# Patient Record
Sex: Female | Born: 1985 | Race: White | Hispanic: No | Marital: Married | Smoking: Never smoker
Health system: Southern US, Community
[De-identification: ages and names within clinical notes are randomized; demographics above are authoritative.]

## PROBLEM LIST (undated history)

## (undated) DIAGNOSIS — G901 Familial dysautonomia [Riley-Day]: Secondary | ICD-10-CM

## (undated) DIAGNOSIS — F419 Anxiety disorder, unspecified: Secondary | ICD-10-CM

## (undated) DIAGNOSIS — N938 Other specified abnormal uterine and vaginal bleeding: Secondary | ICD-10-CM

## (undated) DIAGNOSIS — F410 Panic disorder [episodic paroxysmal anxiety] without agoraphobia: Secondary | ICD-10-CM

## (undated) DIAGNOSIS — I951 Orthostatic hypotension: Secondary | ICD-10-CM

## (undated) DIAGNOSIS — R002 Palpitations: Secondary | ICD-10-CM

## (undated) DIAGNOSIS — I498 Other specified cardiac arrhythmias: Secondary | ICD-10-CM

## (undated) DIAGNOSIS — R000 Tachycardia, unspecified: Secondary | ICD-10-CM

## (undated) DIAGNOSIS — G90A Postural orthostatic tachycardia syndrome (POTS): Secondary | ICD-10-CM

## (undated) DIAGNOSIS — Z9289 Personal history of other medical treatment: Secondary | ICD-10-CM

## (undated) DIAGNOSIS — F250 Schizoaffective disorder, bipolar type: Secondary | ICD-10-CM

## (undated) DIAGNOSIS — F329 Major depressive disorder, single episode, unspecified: Secondary | ICD-10-CM

## (undated) DIAGNOSIS — F32A Depression, unspecified: Secondary | ICD-10-CM

## (undated) DIAGNOSIS — I471 Supraventricular tachycardia: Secondary | ICD-10-CM

## (undated) HISTORY — PX: TUBAL LIGATION: SHX77

## (undated) HISTORY — DX: Depression, unspecified: F32.A

## (undated) HISTORY — DX: Personal history of other medical treatment: Z92.89

## (undated) HISTORY — DX: Supraventricular tachycardia: I47.1

## (undated) HISTORY — PX: VENTRICULAR ABLATION SURGERY: SHX835

## (undated) HISTORY — DX: Other specified abnormal uterine and vaginal bleeding: N93.8

## (undated) HISTORY — DX: Palpitations: R00.2

## (undated) HISTORY — DX: Schizoaffective disorder, bipolar type: F25.0

## (undated) HISTORY — DX: Major depressive disorder, single episode, unspecified: F32.9

## (undated) HISTORY — DX: Familial dysautonomia (riley-day): G90.1

---
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
## 2003-02-10 ENCOUNTER — Other Ambulatory Visit: Admission: RE | Admit: 2003-02-10 | Discharge: 2003-02-10 | Payer: Self-pay | Admitting: *Deleted

## 2003-05-19 ENCOUNTER — Emergency Department (HOSPITAL_COMMUNITY): Admission: EM | Admit: 2003-05-19 | Discharge: 2003-05-19 | Payer: Self-pay | Admitting: Internal Medicine

## 2003-05-20 ENCOUNTER — Other Ambulatory Visit: Admission: RE | Admit: 2003-05-20 | Discharge: 2003-05-20 | Payer: Self-pay | Admitting: *Deleted

## 2003-08-16 ENCOUNTER — Other Ambulatory Visit: Admission: RE | Admit: 2003-08-16 | Discharge: 2003-08-16 | Payer: Self-pay | Admitting: *Deleted

## 2003-11-17 ENCOUNTER — Emergency Department (HOSPITAL_COMMUNITY): Admission: EM | Admit: 2003-11-17 | Discharge: 2003-11-17 | Payer: Self-pay | Admitting: Emergency Medicine

## 2003-12-22 ENCOUNTER — Emergency Department (HOSPITAL_COMMUNITY): Admission: EM | Admit: 2003-12-22 | Discharge: 2003-12-22 | Payer: Self-pay | Admitting: Emergency Medicine

## 2003-12-25 ENCOUNTER — Emergency Department (HOSPITAL_COMMUNITY): Admission: EM | Admit: 2003-12-25 | Discharge: 2003-12-25 | Payer: Self-pay | Admitting: Emergency Medicine

## 2004-08-07 ENCOUNTER — Ambulatory Visit: Payer: Self-pay | Admitting: Orthopedic Surgery

## 2004-08-17 ENCOUNTER — Encounter (HOSPITAL_COMMUNITY): Admission: RE | Admit: 2004-08-17 | Discharge: 2004-09-16 | Payer: Self-pay | Admitting: Orthopedic Surgery

## 2005-07-22 ENCOUNTER — Emergency Department (HOSPITAL_COMMUNITY): Admission: EM | Admit: 2005-07-22 | Discharge: 2005-07-22 | Payer: Self-pay | Admitting: Emergency Medicine

## 2005-09-11 ENCOUNTER — Ambulatory Visit (HOSPITAL_COMMUNITY): Admission: RE | Admit: 2005-09-11 | Discharge: 2005-09-11 | Payer: Self-pay | Admitting: Family Medicine

## 2005-09-13 ENCOUNTER — Ambulatory Visit: Payer: Self-pay | Admitting: *Deleted

## 2005-09-24 ENCOUNTER — Ambulatory Visit (HOSPITAL_COMMUNITY): Admission: RE | Admit: 2005-09-24 | Discharge: 2005-09-24 | Payer: Self-pay | Admitting: *Deleted

## 2005-11-18 ENCOUNTER — Observation Stay (HOSPITAL_COMMUNITY): Admission: AD | Admit: 2005-11-18 | Discharge: 2005-11-18 | Payer: Self-pay | Admitting: Obstetrics and Gynecology

## 2006-01-03 ENCOUNTER — Observation Stay (HOSPITAL_COMMUNITY): Admission: RE | Admit: 2006-01-03 | Discharge: 2006-01-03 | Payer: Self-pay | Admitting: Obstetrics and Gynecology

## 2006-01-06 ENCOUNTER — Inpatient Hospital Stay (HOSPITAL_COMMUNITY): Admission: AD | Admit: 2006-01-06 | Discharge: 2006-01-08 | Payer: Self-pay | Admitting: Obstetrics and Gynecology

## 2006-01-25 ENCOUNTER — Observation Stay (HOSPITAL_COMMUNITY): Admission: AD | Admit: 2006-01-25 | Discharge: 2006-01-26 | Payer: Self-pay | Admitting: Obstetrics & Gynecology

## 2006-02-06 ENCOUNTER — Inpatient Hospital Stay (HOSPITAL_COMMUNITY): Admission: RE | Admit: 2006-02-06 | Discharge: 2006-02-08 | Payer: Self-pay | Admitting: Obstetrics & Gynecology

## 2006-10-07 ENCOUNTER — Emergency Department (HOSPITAL_COMMUNITY): Admission: EM | Admit: 2006-10-07 | Discharge: 2006-10-08 | Payer: Self-pay | Admitting: Emergency Medicine

## 2006-12-05 ENCOUNTER — Ambulatory Visit: Payer: Self-pay | Admitting: Internal Medicine

## 2007-02-14 ENCOUNTER — Ambulatory Visit: Payer: Self-pay | Admitting: Internal Medicine

## 2007-03-27 ENCOUNTER — Ambulatory Visit: Payer: Self-pay | Admitting: Cardiovascular Disease

## 2007-03-27 ENCOUNTER — Emergency Department (HOSPITAL_COMMUNITY): Admission: EM | Admit: 2007-03-27 | Discharge: 2007-03-27 | Payer: Self-pay | Admitting: Emergency Medicine

## 2007-04-23 ENCOUNTER — Ambulatory Visit: Payer: Self-pay | Admitting: Internal Medicine

## 2007-05-11 ENCOUNTER — Emergency Department (HOSPITAL_COMMUNITY): Admission: EM | Admit: 2007-05-11 | Discharge: 2007-05-11 | Payer: Self-pay | Admitting: Emergency Medicine

## 2007-06-02 ENCOUNTER — Ambulatory Visit: Payer: Self-pay | Admitting: Internal Medicine

## 2007-09-01 ENCOUNTER — Ambulatory Visit: Payer: Self-pay | Admitting: Internal Medicine

## 2007-11-20 ENCOUNTER — Emergency Department (HOSPITAL_COMMUNITY): Admission: EM | Admit: 2007-11-20 | Discharge: 2007-11-20 | Payer: Self-pay | Admitting: Emergency Medicine

## 2007-12-04 ENCOUNTER — Emergency Department (HOSPITAL_COMMUNITY): Admission: EM | Admit: 2007-12-04 | Discharge: 2007-12-04 | Payer: Self-pay | Admitting: Emergency Medicine

## 2007-12-08 ENCOUNTER — Ambulatory Visit: Payer: Self-pay | Admitting: Internal Medicine

## 2008-01-05 ENCOUNTER — Ambulatory Visit: Payer: Self-pay | Admitting: Cardiology

## 2008-02-06 ENCOUNTER — Ambulatory Visit: Payer: Self-pay | Admitting: Internal Medicine

## 2008-04-26 ENCOUNTER — Ambulatory Visit: Payer: Self-pay | Admitting: Internal Medicine

## 2008-04-26 LAB — CONVERTED CEMR LAB
CO2: 28 meq/L (ref 19–32)
Calcium: 9.3 mg/dL (ref 8.4–10.5)
Chloride: 106 meq/L (ref 96–112)
Creatinine, Ser: 0.7 mg/dL (ref 0.4–1.2)
Glucose, Bld: 86 mg/dL (ref 70–99)
Potassium: 3.9 meq/L (ref 3.5–5.1)

## 2008-08-26 ENCOUNTER — Ambulatory Visit (HOSPITAL_COMMUNITY): Admission: RE | Admit: 2008-08-26 | Discharge: 2008-08-26 | Payer: Self-pay | Admitting: Pediatrics

## 2008-09-13 ENCOUNTER — Encounter: Payer: Self-pay | Admitting: Internal Medicine

## 2008-09-13 ENCOUNTER — Ambulatory Visit: Payer: Self-pay | Admitting: Internal Medicine

## 2008-10-12 ENCOUNTER — Other Ambulatory Visit: Admission: RE | Admit: 2008-10-12 | Discharge: 2008-10-12 | Payer: Self-pay | Admitting: Obstetrics and Gynecology

## 2008-10-25 ENCOUNTER — Emergency Department (HOSPITAL_COMMUNITY): Admission: EM | Admit: 2008-10-25 | Discharge: 2008-10-25 | Payer: Self-pay | Admitting: Emergency Medicine

## 2009-02-23 ENCOUNTER — Ambulatory Visit: Payer: Self-pay | Admitting: Cardiology

## 2009-02-23 ENCOUNTER — Encounter: Payer: Self-pay | Admitting: Physician Assistant

## 2009-02-23 DIAGNOSIS — R000 Tachycardia, unspecified: Secondary | ICD-10-CM

## 2009-02-23 DIAGNOSIS — I959 Hypotension, unspecified: Secondary | ICD-10-CM

## 2009-03-05 ENCOUNTER — Emergency Department (HOSPITAL_COMMUNITY): Admission: EM | Admit: 2009-03-05 | Discharge: 2009-03-05 | Payer: Self-pay | Admitting: Emergency Medicine

## 2009-03-24 ENCOUNTER — Ambulatory Visit: Payer: Self-pay | Admitting: Obstetrics & Gynecology

## 2009-03-24 ENCOUNTER — Observation Stay (HOSPITAL_COMMUNITY): Admission: AD | Admit: 2009-03-24 | Discharge: 2009-03-24 | Payer: Self-pay | Admitting: Obstetrics & Gynecology

## 2009-05-04 ENCOUNTER — Ambulatory Visit: Payer: Self-pay | Admitting: Obstetrics & Gynecology

## 2009-05-04 ENCOUNTER — Inpatient Hospital Stay (HOSPITAL_COMMUNITY): Admission: AD | Admit: 2009-05-04 | Discharge: 2009-05-05 | Payer: Self-pay | Admitting: Obstetrics & Gynecology

## 2009-05-06 ENCOUNTER — Inpatient Hospital Stay (HOSPITAL_COMMUNITY): Admission: AD | Admit: 2009-05-06 | Discharge: 2009-05-06 | Payer: Self-pay | Admitting: Obstetrics & Gynecology

## 2009-05-06 ENCOUNTER — Ambulatory Visit: Payer: Self-pay | Admitting: Advanced Practice Midwife

## 2009-05-25 ENCOUNTER — Encounter: Payer: Self-pay | Admitting: Internal Medicine

## 2009-07-22 ENCOUNTER — Ambulatory Visit: Payer: Self-pay | Admitting: Internal Medicine

## 2009-08-02 ENCOUNTER — Encounter: Payer: Self-pay | Admitting: Internal Medicine

## 2009-08-02 ENCOUNTER — Telehealth: Payer: Self-pay | Admitting: Internal Medicine

## 2009-08-04 ENCOUNTER — Encounter (HOSPITAL_COMMUNITY): Admission: RE | Admit: 2009-08-04 | Discharge: 2009-09-03 | Payer: Self-pay | Admitting: Family Medicine

## 2009-08-09 ENCOUNTER — Ambulatory Visit (HOSPITAL_COMMUNITY): Admission: RE | Admit: 2009-08-09 | Discharge: 2009-08-09 | Payer: Self-pay | Admitting: Family Medicine

## 2009-08-30 ENCOUNTER — Emergency Department (HOSPITAL_COMMUNITY): Admission: EM | Admit: 2009-08-30 | Discharge: 2009-08-31 | Payer: Self-pay | Admitting: Emergency Medicine

## 2009-09-05 ENCOUNTER — Encounter (HOSPITAL_COMMUNITY): Admission: RE | Admit: 2009-09-05 | Discharge: 2009-10-05 | Payer: Self-pay | Admitting: Family Medicine

## 2009-10-06 ENCOUNTER — Ambulatory Visit: Payer: Self-pay | Admitting: Internal Medicine

## 2010-02-20 ENCOUNTER — Telehealth: Payer: Self-pay | Admitting: Internal Medicine

## 2010-03-03 ENCOUNTER — Ambulatory Visit: Payer: Self-pay | Admitting: Internal Medicine

## 2010-03-03 DIAGNOSIS — G9009 Other idiopathic peripheral autonomic neuropathy: Secondary | ICD-10-CM | POA: Insufficient documentation

## 2010-03-03 LAB — CONVERTED CEMR LAB
CO2: 28 meq/L (ref 19–32)
Calcium: 9.4 mg/dL (ref 8.4–10.5)
Creatinine, Ser: 0.7 mg/dL (ref 0.4–1.2)
Potassium: 4.1 meq/L (ref 3.5–5.1)
Sodium: 138 meq/L (ref 135–145)

## 2010-03-04 ENCOUNTER — Emergency Department (HOSPITAL_COMMUNITY): Admission: EM | Admit: 2010-03-04 | Discharge: 2010-03-04 | Payer: Self-pay | Admitting: Emergency Medicine

## 2010-03-07 ENCOUNTER — Telehealth: Payer: Self-pay | Admitting: Internal Medicine

## 2010-03-07 LAB — CONVERTED CEMR LAB
Hemoglobin, Urine: NEGATIVE
Ketones, ur: NEGATIVE mg/dL
Leukocytes, UA: NEGATIVE
Nitrite: NEGATIVE
Specific Gravity, Urine: 1.019 (ref 1.005–1.030)
Urine Glucose: NEGATIVE mg/dL

## 2010-03-10 ENCOUNTER — Ambulatory Visit: Payer: Self-pay | Admitting: Internal Medicine

## 2010-03-13 LAB — CONVERTED CEMR LAB
BUN: 13 mg/dL (ref 6–23)
Chloride: 106 meq/L (ref 96–112)
Creatinine, Ser: 0.8 mg/dL (ref 0.4–1.2)
Potassium: 4.2 meq/L (ref 3.5–5.1)
Sodium: 137 meq/L (ref 135–145)

## 2010-03-24 ENCOUNTER — Ambulatory Visit: Payer: Self-pay | Admitting: Internal Medicine

## 2010-03-27 ENCOUNTER — Telehealth (INDEPENDENT_AMBULATORY_CARE_PROVIDER_SITE_OTHER): Payer: Self-pay | Admitting: *Deleted

## 2010-07-13 NOTE — Progress Notes (Signed)
Summary: Exercise Program  Phone Note Outgoing Call   Summary of Call: Exercise program at [facility]  Follow-up for Phone Call        [facility] for patient that the exercise program at [facility] is $68.00 per month times 3 months. The rep that called stated that Medicaid does not cover this service. I asked her to call me if she was interested in signing up.  Layne Benton, RN, BSN  March 27, 2010 6:26 PM

## 2010-07-13 NOTE — Assessment & Plan Note (Signed)
Summary: 3 month rov/sl  Medications Added LEXAPRO 10 MG TABS (ESCITALOPRAM OXALATE) pt doesnt take everyday she forgets FLUDROCORTISONE ACETATE 0.1 MG TABS (FLUDROCORTISONE ACETATE) one half a tablet two times per day MAG-OXIDE 400 MG TABS (MAGNESIUM OXIDE) 1 tablet every day      Allergies Added:   Visit Type:  Follow-up Primary Provider:  [facility]- Dr Renne Musca obgyn  CC:  pt has had dizziness and headaches and continues to see spots when she stands up occ.  History of Present Illness: [name] is a 25 year old with a history of dysautonomia.  I last saw her in April.  She was seen later by Leda Gauze. She is now about 2 months postpartum.  She said she felt good during the pregnancy.  Less dizzy. Now, she is feeling dizzy again.  No frank syncope. No chest pain.  Does complain of headaches often.  Current Medications (verified): 1)  Lexapro 10 Mg Tabs (Escitalopram Oxalate) .... Pt Doesnt Take Everyday She Forgets  Allergies (verified): 1)  ! Vancomycin  Past History:  Past Medical History: Last updated: 02/22/2009 Dysautonomia History of SVT, s/p slow path modification 2004  Family History: Last updated: 02/22/2009  Positive for hypertension, diabetes, depression.  Social History: Last updated: 02/22/2009 No tobacco No etoh  Past Surgical History: Cesarean section ablation for supraventricular tachycardia.  Vital Signs:  Patient profile:   25 year old female Height:      61 inches Weight:      128 pounds Pulse (ortho):   92 / minute BP standing:   109 / 80 Cuff size:   regular  Vitals Entered By: Burnett Kanaris, CNA (July 22, 2009 10:15 AM)    Serial Vital Signs/Assessments:  Time      Position  BP       Pulse  Resp  Temp     By 0 min     Lying LA  104/70   82                    Burnett Kanaris, CNA 0 min     Sitting   109/80   92                    Burnett Kanaris, CNA 0 min     Standing  109/80   92                    Burnett Kanaris,  CNA      Lying RA  104/70   78                    Burnett Kanaris, CNA 0 min     Lying RA  104/70   82                    Burnett Kanaris, CNA 0 min     Sitting   109/80   92                    Burnett Kanaris, CNA 0 min     Standing  114/78   103                   Burnett Kanaris, CNA      Standing  115/80   96                    Burnett Kanaris, CNA  3 min     Standing  111/77   112                   Burnett Kanaris, CNA  Comments: 0 min no dizziness By: Burnett Kanaris, CNA  0 min a lilttle diziness By: Burnett Kanaris, CNA  0 min dizziness By: Burnett Kanaris, CNA  dizziness By: Burnett Kanaris, CNA  0 min dizziness By: Burnett Kanaris, CNA  dizziness By: Burnett Kanaris, CNA  3 min dizziness By: Burnett Kanaris, CNA    Physical Exam  Additional Exam:  HEENT:  Normocephalic, atraumatic. EOMI, PERRLA.  Neck: JVP is normal. No thyromegaly. No bruits.  Lungs: clear to auscultation. No rales no wheezes.  Heart: Regular rate and rhythm. Normal S1, S2. No S3.   No significant murmurs. PMI not displaced.  Abdomen:  Supple, nontender. Normal bowel sounds. No masses. No hepatomegaly.  Extremities:   Good distal pulses throughout. No lower extremity edema.  Musculoskeletal :moving all extremities.  Neuro:   alert and oriented x3.    Impression & Recommendations:  Problem # 1:  * DYSAUTONOMIA Patient iwth a history of postural orthostatic tachycardia syndrome (POTS).  She is now postpartum.  She was off meds duing most of her pregnancy.  She actually felt better, probably because of increased volume. She is now symptomatic again and indeed her pulse shows this. I have reviewed with Doretha Imus.  I would recomm that she finish breast feeding.  She is on Lexapro at present and I am concerned.  I told her to contact Dr Despina Hidden. Once she has stopped breast feeding she can start Florinef  0.05 mg two times a day.  F/U BMET in 10 days.  F/U clinic in 4 months. Stay hydrated.  Mg  Oxide 400 mg for  headaches.  Patient Instructions: 1)  Your physician recommends that you return for lab work in: lab work in 7 to 10 days at the [facility] in [place]  next to [facility] Prescriptions: MAG-OXIDE 400 MG TABS (MAGNESIUM OXIDE) 1 tablet every day  #30 x 6   Entered by:   Layne Benton, RN, BSN   Authorized by:   Sherrill Raring, MD, [facility]   Signed by:   Layne Benton, RN, BSN on 07/22/2009   Method used:   Electronically to        [facility]  [address] (retail)       [address]       [place], [place]  [address]       Ph: [phone]       Fax: [phone]   RxID:   [id number] FLUDROCORTISONE ACETATE 0.1 MG TABS (FLUDROCORTISONE ACETATE) one half a tablet two times per day  #30 x 6   Entered by:   Layne Benton, RN, BSN   Authorized by:   Sherrill Raring, MD, [facility]   Signed by:   Layne Benton, RN, BSN on 07/22/2009   Method used:   Electronically to        [facility]  [address] (retail)       [address]       [place], [place]  [address]       Ph: [phone]       Fax: [phone]   RxID:   [id number]

## 2010-07-13 NOTE — Progress Notes (Signed)
Summary: lightheadness, dizzy, ha   Phone Note Call from Patient Call back at Home Phone [phone] Call back at [phone]   Caller: Patient Reason for Call: Talk to Doctor Complaint: Headache Summary of Call: c/o dizzy, lightheadness, ha,  Initial call taken by: Lorne Skeens,  February 20, 2010 8:37 AM  Follow-up for Phone Call        Spoke with patient ...she is complaining of lightheadedness and "racing heart " when standing up on and off times 2 to 3 months. She states that she is no longer taking Florinef because her BP was normal at last office visit. She states that  she has seen [facility] for "occipital neuralgia" but no medications were given. She mentioned that her pupils are often very small.Marland KitchenMarland KitchenMarland KitchenI advised her to discuss this issue with her MD at [facility]. Advised will discuss lightheadedness and racing heart with Dr.Ross and call her back. Layne Benton, RN, BSN  February 20, 2010 10:03 AM    Additional Follow-up for Phone Call Additional follow up Details #1::        Discussed above with Dr.Ross...advised to be seen in office ASAP. Offered patient an appointment on 9/16 at 845 am but she declined and will come in on 9/23 at 12 noon.Layne Benton, RN, BSN  February 20, 2010 6:35 PM

## 2010-07-13 NOTE — Progress Notes (Signed)
Summary: RETURNING NURSE CALL  Phone Note Call from Patient Call back at [phone]   Caller: PT Reason for Call: Talk to Nurse, Talk to Doctor Summary of Call: RETURNING NURSE CALL FROM YESTERDAY ABOUT TEST RESULTS. Initial call taken by: Faythe Ghee,  March 07, 2010 8:58 AM  Follow-up for Phone Call        Called patient with her lab results and she mentioned that she went to the [facility] ER a few days ago because of a severe headache. She states that she was given pain medication that caused her heart rate to increase to 180 beats per minute. She was then sent home with Metoprolol 12.5mg  two times a day. She states that the Neurontin medication that neuro gave her is not working for her. I advised her to contact her MD at [facility] about this. She has an appointment here for lab work on 9/30 and a ROV with Dr.Ross on 10/14. Advised her that I would let Dr.Ross know about the ER visit and the addition of the beta blocker medication.    Layne Benton, RN, BSN  March 07, 2010 10:35 AM     New/Updated Medications: METOPROLOL TARTRATE 25 MG TABS (METOPROLOL TARTRATE) one half 2 times per day

## 2010-07-13 NOTE — Assessment & Plan Note (Signed)
Summary: rov/ gd   Visit Type:  Follow-up Primary Provider:  [facility]  CC:  pt states today and yesterday has not been the best two days she has not been feeling herself.  History of Present Illness: Patient is a 25 year old with a hsitory of dysautonomia.    I saw her in clinic not that long ago.   She was recently seen in the [facility] ER for headache.  In the ER the physcian who saw her  placed her on metoprolol.  she says she is feeling some better on this.  Still dizzy at times.  No syncope.  Current Medications (verified): 1)  Zoloft 50 Mg Tabs (Sertraline Hcl) .Marland Kitchen.. 1 Tab Once Daily 2)  Valium 10 Mg Tabs (Diazepam) .... 1/2 Tab Daily 3)  Vitamin D3 1000 Unit Caps (Cholecalciferol) .... Take 1 Tablet By Mouth Once A Day 4)  Gabapentin 100 Mg Caps (Gabapentin) .... Take 1 Tab By Mouth At Bedtime 5)  Fludrocortisone Acetate 0.1 Mg Tabs (Fludrocortisone Acetate) .... One Half A Tab At 8am and 2pm 6)  Metoprolol Tartrate 25 Mg Tabs (Metoprolol Tartrate) .... One Half 2 Times Per Day  Allergies (verified): 1)  ! Vancomycin  Past History:  Past medical, surgical, family and social histories (including risk factors) reviewed, and no changes noted (except as noted below).  Past Medical History: Reviewed history from 02/22/2009 and no changes required. Dysautonomia History of SVT, s/p slow path modification 2004  Past Surgical History: Reviewed history from 07/22/2009 and no changes required. Cesarean section ablation for supraventricular tachycardia.  Family History: Reviewed history from 02/22/2009 and no changes required.  Positive for hypertension, diabetes, depression.  Social History: Reviewed history from 02/22/2009 and no changes required. No tobacco No etoh  Vital Signs:  Patient profile:   25 year old female Height:      61 inches Weight:      141 pounds BMI:     26.74 Pulse (ortho):   75 / minute BP standing:   100 / 67 Cuff size:    regular  Vitals Entered By: Burnett Kanaris, CNA (March 24, 2010 11:44 AM)  Serial Vital Signs/Assessments:  Time      Position  BP       Pulse  Resp  Temp     By 0 min     Lying LA  94/63    68                    Burnett Kanaris, CNA 0 min     Sitting   96/63    70                    Burnett Kanaris, CNA 0 min     Standing  100/67   75                    Burnett Kanaris, CNA 2 min     Standing  94/67    86                    Burnett Kanaris, CNA 3 min     Standing  96/66    79                    Burnett Kanaris, CNA  Comments: 0 min from lying to sitting pt felt dizziness Pt continues to feel some dizziness  By: Burnett Kanaris, CNA  2  min pt has some dizziness By: Burnett Kanaris, CNA  3 min pt had a little dizziness throughout the blood pressures By: Burnett Kanaris, CNA    Physical Exam  Additional Exam:  patient is in NAD HEENT:  Normocephalic, atraumatic. EOMI, PERRLA.  Neck: JVP is normal. No thyromegaly. No bruits.  Lungs: clear to auscultation. No rales no wheezes.  Heart: Regular rate and rhythm. Normal S1, S2. No S3.   No significant murmurs. PMI not displaced.  Abdomen:  Supple, nontender. Normal bowel sounds. No masses. No hepatomegaly.  Extremities:   Good distal pulses throughout. No lower extremity edema.  Musculoskeletal :moving all extremities.  Neuro:   alert and oriented x3.    Impression & Recommendations:  Problem # 1:  * DYSAUTONOMIA Pateint feels a little bit better with addition of metoprolol.  I would keep her on this as well as Florinef.  COntinue to take fluids. Orthostatic bp/p is better today.  I would set to see later this fall.  Problem # 2:  UNSPECIFIED TACHYCARDIA (ICD-785.0) Follow on metoprolol  Patient Instructions: 1)  Your physician recommends that you schedule a follow-up appointment in: 2 months

## 2010-07-13 NOTE — Assessment & Plan Note (Signed)
Summary: POTS /needs orthostatic BP's/jr  Medications Added VITAMIN D3 1000 UNIT CAPS (CHOLECALCIFEROL) Take 1 tablet by mouth once a day GABAPENTIN 100 MG CAPS (GABAPENTIN) Take 1 tab by mouth at bedtime AMOXICILLIN 500 MG CAPS (AMOXICILLIN) Take 1 capsule by mouth three times a day FLUDROCORTISONE ACETATE 0.1 MG TABS (FLUDROCORTISONE ACETATE) one half a tab at 8am and 2pm        Visit Type:  Follow-up Primary Provider:  [facility]  CC:  dizziness- lightheadedness.  History of Present Illness: [name] [name] is a 25 year old with a history of POTS.  I saw her in the spring.  At that time she had no orthostatic changes.  She called in a few days ago compliaining of increased dizziness. She has noted increased sharp shooting HA at back of head.  Seen at [facility]  Rx:  Gabapentin Also notes hair thinning   Has appt in future with Derm.  Has cut back on Zoloft to 25 mg.  No chest pains.  Claims drinking fluids.    Current Medications (verified): 1)  Zoloft 50 Mg Tabs (Sertraline Hcl) .Marland Kitchen.. 1 Tab Once Daily 2)  Valium 10 Mg Tabs (Diazepam) .... 1/2 Tab Daily 3)  Vitamin D3 1000 Unit Caps (Cholecalciferol) .... Take 1 Tablet By Mouth Once A Day 4)  Gabapentin 100 Mg Caps (Gabapentin) .... Take 1 Tab By Mouth At Bedtime 5)  Amoxicillin 500 Mg Caps (Amoxicillin) .... Take 1 Capsule By Mouth Three Times A Day  Allergies: 1)  ! Vancomycin  Past History:  Past medical, surgical, family and social histories (including risk factors) reviewed, and no changes noted (except as noted below).  Past Medical History: Reviewed history from 02/22/2009 and no changes required. Dysautonomia History of SVT, s/p slow path modification 2004  Past Surgical History: Reviewed history from 07/22/2009 and no changes required. Cesarean section ablation for supraventricular tachycardia.  Family History: Reviewed history from 02/22/2009 and no changes required.  Positive for  hypertension, diabetes, depression.  Social History: Reviewed history from 02/22/2009 and no changes required. No tobacco No etoh  Review of Systems       Constipation.  Vital Signs:  Patient profile:   25 year old female Height:      61 inches Weight:      141 pounds BMI:     26.74 Pulse rate:   79 / minute Pulse (ortho):   94 / minute Pulse rhythm:   regular Resp:     18 per minute BP standing:   103 / 67  Vitals Entered By: Vikki Ports (March 03, 2010 11:58 AM)  Serial Vital Signs/Assessments:  Time      Position  BP       Pulse  Resp  Temp     By 12:04 PM  Lying RA  104/70   76                    Luz Jaramillo 12:04 PM  Sitting   101/67   96                    Luz Jaramillo 12:05 PM  Standing  103/67   94                    Luz Jaramillo 12:07 PM  Standing  108/74   94                    Doree Fudge  Jaramillo 12:10 PM  Standing  103/70   110                   Vikki Ports  Comments: 12:10 PM Lying No symptoms Sitting No symptoms Standing- No symptoms Standing 2 minutes- No symptoms Standing 3 minutes- No symptoms By: Vikki Ports    Physical Exam  Additional Exam:  patient in NAD HEENT:  Normocephalic, atraumatic. EOMI, PERRLA.  Neck: JVP is normal. No thyromegaly. No bruits.  Lungs: clear to auscultation. No rales no wheezes.  Heart: Regular rate and rhythm. Normal S1, S2. No S3.   No significant murmurs. PMI not displaced.  Abdomen:  Supple,   Mild LLQ tenderness.   Normal bowel sounds. No masses. No hepatomegaly.  Extremities:   Good distal pulses throughout. No lower extremity edema.  Musculoskeletal :moving all extremities.  Neuro:   alert and oriented x3.    Impression & Recommendations:  Problem # 1:  * DYSAUTONOMIA patient does show evidence of POTS on exam today.  I wonder if it is due to her tapiering Zoloft.  I would not taper further. I would add Florinef 0.05 two times  a day.  Check BMET and UA today; BMET in 10 days. Drink fluids.  Roe Coombs  't taper Zoloft any more. F/U in 4 to 6 wks.  Increase fiber for constipation.  Other Orders: TLB-BMP (Basic Metabolic Panel-BMET) (80048-METABOL) TLB-Udip ONLY (81003-UDIP)  Patient Instructions: 1)  Your physician recommends that you schedule a follow-up appointment in: 1 month 2)  Your physician recommends that you return for lab work in: labs today and bmet in 1 week Prescriptions: FLUDROCORTISONE ACETATE 0.1 MG TABS (FLUDROCORTISONE ACETATE) one half a tab at 8am and 2pm  #30 x 2   Entered by:   Layne Benton, RN, BSN   Authorized by:   Sherrill Raring, MD, [facility]   Signed by:   Layne Benton, RN, BSN on 03/03/2010   Method used:   Electronically to        [facility]  [address] (retail)       [address]       [place], [place]  [address]       Ph: [phone]       Fax: [phone]   RxID:   [id number]

## 2010-07-13 NOTE — Progress Notes (Signed)
Summary: lab orders   Phone Note From Other Clinic   Caller: Nurse Lida Summary of Call: Needs lab order for BMET faxed to the [facility] in [place] fax [phone]. Pt there now Initial call taken by: Edman Circle,  August 02, 2009 12:55 PM  Follow-up for Phone Call        order faxed Follow-up by: Dossie Arbour, RN, BSN,  August 02, 2009 1:19 PM

## 2010-07-13 NOTE — Assessment & Plan Note (Signed)
Summary: 2 month rov/sl  Medications Added ZOLOFT 50 MG TABS (SERTRALINE HCL) 1 tab once daily VALIUM 10 MG TABS (DIAZEPAM) 1/2 tab daily * VITAMIN D3 daliy      Allergies Added:   Visit Type:  Follow-up Primary Provider:  [facility]  CC:  headaches-pt is getting wisdom teeth pulled (4) May 7th.Pt lost her flonief about 3 weeks ago.  History of Present Illness: Patient is a 25 year old with a history of POTS.  I last saw hier in Feb.  When I saw her she was 2 mon post partum   She was complaining of increased dizziness (she did well during pregnancy).   When I saw her she had tachycardia with standing.  I recommended finishing breast feeding and then starting Florinef.   Since seen, she has done a little better.  She actually ran out of her Florinef a few wks ago.  Her dizziness has improved.  She still has some but it is not bad.  She is eatting salt, drinking fluids. She is getting her wisdom teeth pulled next wk.  Has some jaw tenderness.  Also points of tenderness intermitt on scalp.  Current Medications (verified): 1)  Zoloft 50 Mg Tabs (Sertraline Hcl) .Marland Kitchen.. 1 Tab Once Daily 2)  Valium 10 Mg Tabs (Diazepam) .... 1/2 Tab Daily 3)  Vitamin D3 .... Daliy  Allergies (verified): 1)  ! Vancomycin  Past History:  Past medical, surgical, family and social histories (including risk factors) reviewed, and no changes noted (except as noted below).  Past Medical History: Reviewed history from 02/22/2009 and no changes required. Dysautonomia History of SVT, s/p slow path modification 2004  Past Surgical History: Reviewed history from 07/22/2009 and no changes required. Cesarean section ablation for supraventricular tachycardia.  Family History: Reviewed history from 02/22/2009 and no changes required.  Positive for hypertension, diabetes, depression.  Social History: Reviewed history from 02/22/2009 and no changes required. No tobacco No etoh  Vital  Signs:  Patient profile:   25 year old female Height:      61 inches Weight:      131 pounds BMI:     24.84 Pulse (ortho):   83 / minute BP standing:   109 / 78 Cuff size:   regular  Vitals Entered By: Burnett Kanaris, CNA (October 06, 2009 10:09 AM)  Serial Vital Signs/Assessments:  Time      Position  BP       Pulse  Resp  Temp     By o min     Lying LA  106/74   78                    Burnett Kanaris, CNA o min     Sitting   102/74   85                    Burnett Kanaris, CNA o min     Standing  109/78   83                    Burnett Kanaris, CNA 2 min     Standing  107/80   100                   Burnett Kanaris, CNA 3 min     Standing  108/75   93  Burnett Kanaris, CNA  Comments: o min very little dizziness By: Burnett Kanaris, CNA  2 min no dizziness By: Burnett Kanaris, CNA  3 min no dizziness pt doing ok By: Burnett Kanaris, CNA    Physical Exam  Additional Exam:  patieint is in NAD HEENT:  Normocephalic, atraumatic. EOMI, PERRLA.  Neck: JVP is normal. No thyromegaly. No bruits.  Lungs: clear to auscultation. No rales no wheezes.  Heart: Regular rate and rhythm. Normal S1, S2. No S3.   No significant murmurs. PMI not displaced.  Abdomen:  Supple, nontender. Normal bowel sounds. No masses. No hepatomegaly.  Extremities:   Good distal pulses throughout. No lower extremity edema.  Musculoskeletal :moving all extremities.  Neuro:   alert and oriented x3.    Impression & Recommendations:  Problem # 1:  * DYSAUTONOMIA She is doing fairly well now without meds.  HR increases some with standing but it is not diagnositc.  Encouraged her to stay hydrated, keep adeq salt.  Encouraged upper body exercises.  F/U in winter, sooner if needed.  Appended Document: 2 month rov/sl Faxed note to Dr.Scott Jensen at [phone].

## 2010-09-03 LAB — CBC
Hemoglobin: 13.4 g/dL (ref 12.0–15.0)
RBC: 4.35 MIL/uL (ref 3.87–5.11)
RDW: 12.8 % (ref 11.5–15.5)
WBC: 8.2 10*3/uL (ref 4.0–10.5)

## 2010-09-03 LAB — DIFFERENTIAL
Basophils Absolute: 0 10*3/uL (ref 0.0–0.1)
Eosinophils Relative: 3 % (ref 0–5)
Lymphocytes Relative: 34 % (ref 12–46)
Monocytes Absolute: 0.5 10*3/uL (ref 0.1–1.0)

## 2010-09-03 LAB — BASIC METABOLIC PANEL
BUN: 14 mg/dL (ref 6–23)
Calcium: 9.1 mg/dL (ref 8.4–10.5)
Chloride: 107 mEq/L (ref 96–112)
Glucose, Bld: 91 mg/dL (ref 70–99)
Sodium: 139 mEq/L (ref 135–145)

## 2010-09-13 LAB — CBC
HCT: 31 % — ABNORMAL LOW (ref 36.0–46.0)
Hemoglobin: 12.1 g/dL (ref 12.0–15.0)
MCHC: 33.4 g/dL (ref 30.0–36.0)
MCHC: 33.8 g/dL (ref 30.0–36.0)
MCV: 91.7 fL (ref 78.0–100.0)
RBC: 3.95 MIL/uL (ref 3.87–5.11)
RDW: 13.8 % (ref 11.5–15.5)

## 2010-09-13 LAB — RPR: RPR Ser Ql: NONREACTIVE

## 2010-09-14 LAB — URINALYSIS, DIPSTICK ONLY
Glucose, UA: NEGATIVE mg/dL
Ketones, ur: 40 mg/dL — AB
Nitrite: NEGATIVE
Specific Gravity, Urine: 1.025 (ref 1.005–1.030)
pH: 7 (ref 5.0–8.0)

## 2010-09-14 LAB — CULTURE, BETA STREP (GROUP B ONLY)

## 2010-09-14 LAB — FETAL FIBRONECTIN: Fetal Fibronectin: NEGATIVE

## 2010-09-14 LAB — WET PREP, GENITAL: Trich, Wet Prep: NONE SEEN

## 2010-09-19 LAB — URINALYSIS, ROUTINE W REFLEX MICROSCOPIC
Bilirubin Urine: NEGATIVE
Hgb urine dipstick: NEGATIVE
Ketones, ur: NEGATIVE mg/dL
Nitrite: NEGATIVE
pH: 6 (ref 5.0–8.0)

## 2010-09-19 LAB — BASIC METABOLIC PANEL
BUN: 7 mg/dL (ref 6–23)
CO2: 24 mEq/L (ref 19–32)
GFR calc Af Amer: >60 mL/min (ref >60)
GFR calc non Af Amer: >60 mL/min (ref >60)
Glucose, Bld: 104 mg/dL — ABNORMAL HIGH (ref 70–99)
Potassium: 4 mEq/L (ref 3.5–5.1)

## 2010-09-19 LAB — CBC
HCT: 33.9 % — ABNORMAL LOW (ref 36.0–46.0)
Hemoglobin: 12.2 g/dL (ref 12.0–15.0)
MCV: 90.1 fL (ref 78.0–100.0)
Platelets: 211 10*3/uL (ref 150–400)

## 2010-09-19 LAB — DIFFERENTIAL
Basophils Absolute: 0 10*3/uL (ref 0.0–0.1)
Eosinophils Relative: 1 % (ref 0–5)
Lymphocytes Relative: 25 % (ref 12–46)
Monocytes Absolute: 0.5 10*3/uL (ref 0.1–1.0)
Monocytes Relative: 5 % (ref 3–12)

## 2010-10-24 NOTE — Assessment & Plan Note (Signed)
[facility]                            CARDIOLOGY OFFICE NOTE   NAME:[name], [name]                      MRN:          [id number]  DATE:02/06/2008                            DOB:          [birth year]    IDENTIFICATION:  [name] is a 25 year old woman.  She has a history  of dysautonomia.  I last saw her in June.   When I saw her last, I added Zoloft to her regimen 25 and 50 daily.   In the interval, she says her anxiety might be little bit better.   She still gets episodes of chest tightness, dizziness.  No frank  syncope.  She also notes that her pupils especially on the left are very  reactive, actually her left pupil was up and down rapidly.  Notes  occasional fuzziness in her vision.  This is not constant though.   CURRENT MEDICINES:  1. Florinef 0.05 b.i.d. (the patient currently taking at 8 and 4 p.m.)      gets up at 6:00 a.m., goes to bed at 10:00 p.m.  2. Zoloft 50 daily.   PHYSICAL EXAMINATION:  GENERAL:  The patient is in no acute distress at  rest.  VITAL SIGNS:  Blood pressure is laying, 114/76, pulse 84, the patient is  dizzy;  at sitting, 126/81, pulse 96, the patient dizzy; standing at 0  minutes 127/85, pulse 88, the patient dizzy; 2 minutes, 122/75, pulse  92, the patient dizzy; 5 minutes, 124/88, pulse 97, no symptoms  recorded.  LUNGS:  Clear.  CARDIAC:  Regular rate and rhythm.  S1 and S2.  No S3.  HEENT:  Small knot on the left frontal portion of head (benign-  appearing).  Pupils respond to light.  Equal.  ABDOMEN:  Benign.  EXTREMITIES:  No edema.   A 12-lead EKG, normal sinus rhythm.  Short PR interval.   IMPRESSION:  [name] is a 25 year old very anxious woman.  She has a  history of mild dysautonomia.  She is sensitive to medicines.  I have  instructed her to take her Florinef at 6 and 2.  I will also add  magnesium oxide to her regimen.  She notes some headaches that are  mainly posterior.  See if this  helps, also heat to the area.  Continue  on the Zoloft.   I encouraged her to stay active, multiple small meals throughout the  day.  She has tendency towards hypoglycemia, which could exacerbate.  I  will set to see her back in the winter, sooner if problems develop.     Pricilla Riffle, MD, [facility]  Electronically Signed    PVR/MedQ  DD: 02/06/2008  DT: 02/07/2008  Job #: [id number]

## 2010-10-24 NOTE — Assessment & Plan Note (Signed)
[facility]                       [place] CARDIOLOGY OFFICE NOTE   NAME:[name], BARBETTE MCGLAUN                      MRN:          [id number]  DATE:06/02/2007                            DOB:          13-Oct-1985    IDENTIFICATION:  [name] is a 25 year old woman who I have seen in  the past.  I last saw her, actually, back in September.  Since that time  she has been seen by Maurine Cane, and also by Berton Mount.  Note, on  recent evaluation by Berton Mount it was suggested that she consider  going to [facility] for assistance with  anxiety/stress which contributed to her symptoms.  She has not done  this.   She still has symptoms of dizziness, weakness.  She has no syncope.  She  is taking Florinef a half 1 time per day, stopped the metoprolol after  Dr. Graciela Husbands had told there was not anything with her heart.   CURRENT MEDICATIONS:  1. Florinef 0.05 daily.  2. Implanon (contraceptive).   PHYSICAL EXAMINATION:  On exam, the patient is in no distress.  Blood pressure laying 104/68, pulse 72.  Sitting 100/64, pulse 78.  Standing at zero minutes 100/60, pulse 84.  Two minutes 98/62, pulse is  84.  Five minutes 94/60, pulse 88.  Her lungs are clear.  NECK:  JVP is normal.  CARDIAC EXAM:  Regular rate and rhythm, S1, S2, no S3, no murmurs.  ABDOMEN:  Benign.  EXTREMITIES:  No edema.   IMPRESSION:  Autonomic dysfunction, mild.  I told her to increase her  Florinef to 0.05 at 8 a.m. and 2 p.m.  Check a BMET in 10 days.  She  does have an anxiety component to this that probably contributes to some  of this, I think.  In talking to her, she is very anxious about all her  spells and what they represent.  She has tried Zoloft in the past,  cannot afford.  She did not tolerate Paxil.  Given a prescription for  Celexa 20 to take.  I will see her back in 6 weeks.  This may help with  the serotonin portion.   I encouraged her to drink  adequate liquids and eat adequate salt.  Again, I thought following up with [facility] would be  appropriate as she had some significant stressors which would contribute  to her feeling poorly.   She does have a history, now, of some numbness on the left side of her  body that occurred several weeks ago, but no other neurologic  complaints.  She also said one night she was  urinating frequently.  Again, this has resolved, will need to follow.  No urinary tract infection-type symptoms.     Pricilla Riffle, MD, [facility]  Electronically Signed    PVR/MedQ  DD: 06/02/2007  DT: 06/03/2007  Job #: [id number]

## 2010-10-24 NOTE — Assessment & Plan Note (Signed)
[facility]                       [place] CARDIOLOGY OFFICE NOTE   NAME:[name] [name]                      MRN:          [id number]  DATE:12/05/2006                            DOB:          1986-03-27    IDENTIFICATION:  [name] is a 25 year old woman who was seen last in  cardiology actually back in 2005. She has a history of SVT and underwent  slow pathway modification in 2004 by Berton Mount, also there is a  question of a history of POTS.   She comes in today because she has been feeling her heart racing more.  Note that she has called in the past for this. She says that it started  a few weeks at church. There is no syncope, but she says that she is  dizzy a lot.   Even when her heart is not racing, she will feel bad. She just gets  tired out. Some days she just cannot do anything. She, again, denies  syncope.   CURRENT MEDICATIONS:  Birth control patch.   PHYSICAL EXAMINATION:  GENERAL:  The patient is no distress.  VITAL SIGNS:  Blood pressure 110/82 on arrival, orthostatic, checked  blood pressure laying 110/68, pulse 88, sitting 130/70, pulse 100,  standing at 0 minutes 120/70, pulse 80, 2 minutes 112/68, pulse 92, 5  minutes 92/70, pulse 88.  LUNGS:  Clear.  CARDIAC:  Regular rate and rhythm, S1, S2, no S3, no murmurs or clicks.  ABDOMEN:  Benign.  EXTREMITIES:  No edema.   12 lead EKG normal sinus rhythm, 82 beats-per-minute.   IMPRESSION:  1. Palpitations. I will set the patient up for a event monitor to see      if she has any evidence of SVT, indeed she does have some signs of      orthostasis on exam. This may explain that her symptoms may all be      sinus tach.  2. Dizziness. Again, may be related to palpitations versus automatic      issues, again which have been of concern. She says that she is      eating salt. I will check a 24 hour sodium to see if she is getting      adequate intake. If she is, we need to try  agents to improve. Note      she has had a CBC in the past which has been normal. It has not      been checked recently. Electrolytes are normal. I will be in touch      with the patient as where to proceed once I see the test results.     Pricilla Riffle, MD, [facility]  Electronically Signed    PVR/MedQ  DD: 12/05/2006  DT: 12/06/2006  Job #: [id number]

## 2010-10-24 NOTE — Assessment & Plan Note (Signed)
[facility]                       [place] CARDIOLOGY OFFICE NOTE   NAME:[name], JOEL MERICLE                      MRN:          [id number]  DATE:02/14/2007                            DOB:          1985/10/15    IDENTIFICATION:  Ms. Alphia Kava is a 25 year old woman with a history of  SVT, status post low pathway modification in 2004 and probable POT's.  I  last saw her in June.   When I saw her last, I checked a 24-hour sodium and blood work.  The  blood work was normal, including cortisol and hemoglobin.  A 24-hour  urine sodium was 326, which was within normal range.   Because of concern for pause, I added Florinef 0.05 mg b.i.d.  She  started taking it.  She called and said she had some swelling and  redness of her eyes, and I told her to back off to daily.  She is doing  this and feels some better.  Notes occasional dizziness, has some good  days and bad.  Appetite is somewhat down but stable.   Current medications include Florinef 0.05 daily and oral contraceptives.   PHYSICAL EXAMINATION:  Patient is in no distress.  Blood pressure 90/60 lying with a pulse of 88 sitting, and 90/60 with a  pulse 80 standing at 0 minutes, 88/60 with a pulse of 82 at 2 minutes,  86/60 with a pulse of 84 at five minutes, 88/62 with a pulse of 92.  LUNGS:  Clear.  CARDIAC:  Regular rate and rhythm.  S1 and S2.  No S3.  A grade 1/6  systolic murmur.  ABDOMEN:  Benign.  EXTREMITIES:  No edema.   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  She seems to be a little better by blood  pressure in her symptoms on the Florinef.  I would keep her on low dose.  I have talked with her about elevating the head of the bed, increasing  her water intake, using a girdle or some compression device.  Small  meals throughout the day.  She says at times her sugars run low.  She  checks them on her mother's machine.  I told her to try  and keep each  meal package with protein, fat, or carbohydrate to match, so she does  not bottom out her glucose.  Again, encouraged her to stay as active as  she can, as this may help.  I would like to see her back in a few  months.  I would not switch her medicines for now.  Again, provide  reassurance.  She is very anxious about everything.     Pricilla Riffle, MD, [facility]  Electronically Signed    PVR/MedQ  DD: 02/14/2007  DT: 02/14/2007  Job #: [id number]

## 2010-10-24 NOTE — Assessment & Plan Note (Signed)
[facility]                       [place] CARDIOLOGY OFFICE NOTE   NAME:[name] [name], [name] [name]                      MRN:          [id number]  DATE:03/27/2007                            DOB:          1985/07/20    [name] is seen today as an add-on.  She is a patient of Dr. Graciela Husbands  and Dr. Tenny Craw.  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  Also been diagnosed with POTS.  The patient was seen in the  emergency room today.  She had an episode of rapid palpitations, felt  faint.  Felt nauseated.  She was seen in the emergency room.  At that  time, she had no significant arrhythmias.  She was sent home and told to  follow up with [place].  The patient has not had any significant fevers.  She  has actually not passed out.  She did have an event monitor that was  placed in August, which primarily showed sinus arrhythmia and sinus  tachycardia with no AV nodal reentrant tachycardia or PSVT.  She had  previously been on beta blockers prior to her ablation, but these have  not been resumed.  Her Florinef dose has been cut back to 0.05 mg once a  day.   In talking to the patient, on review of systems, she has had a multitude  of myriad complaints.  She complains of aching all over, headaches,  question low blood sugar.  Palpitations and fatigue.   I do not have the resent thyroid study from the patient.   Her review of systems otherwise negative today in the office.   EXAM:  Blood pressure 128/62 lying with the heart rate of 88, sitting  was 118/60 with a heart rate of 94, and standing 112/60 with a heart  rate of 96.  She otherwise appears healthy.  SKIN:  Warm and dry.  HEENT:  Unremarkable.  There is no thyromegaly or lymphadenopathy.  No JVP elevation.  No  bruits.  LUNGS:  Clear.  Good diaphragmatic motion.  No wheezing.  She has an S1, S2 with normal heart sounds.   PMI is normal.  ABDOMEN:  Benign.  No tenderness.  No hepatosplenomegaly.  No  hepatojugular reflux.  No tenderness.  Femorals are +4 bilaterally.  PT +3.  There is no lower extremity edema.  NEURO:  Nonfocal.  MUSCULOSKELETAL:  Normal.   IMPRESSION:  1. Tachycardia, recent monitor showing no significant recurrence.  I      would like the patient to see Dr. Graciela Husbands again.  She has had a slow      pathway modification, and it may be worthwhile to reexamine her      substrate.  For the time being, I will restart her on Toprol 25 mg      a day, which she has tolerated in the past.  2. Postural orthostatic tachycardia syndrome.  The patient continues      to have symptoms of postural hypotension.  She was mildly postural  in the office today.  She has no lower extremity edema and she      certainly is not hypertensive.  We will increase her Florinef to      0.1 mg a day.  I will have her follow up with Dr. Tenny Craw for this.  3. Myriad of somatic complaints to be followed up by Dr. Milinda Cave.  I      am not sure how to tie all of these together in regard to myalgias,      headache, nausea.   Since the patient has been diagnosed with dysautonomia, it would be  worthwhile to probably check a random cortisol level, thyroid, possible  CT scan to rule out adrenal mass.   I will leave this up to her medical doctor to decide.   Prescriptions for Toprol 25 and Florinef 0.1 mg were given.     Noralyn [place]. Eden Emms, MD, [facility]  Electronically Signed    PCN/MedQ  DD: 03/27/2007  DT: 03/28/2007  Job #: [id number]   cc:   Pricilla Riffle, MD, [facility]  Duke Salvia, MD, [facility]

## 2010-10-24 NOTE — Assessment & Plan Note (Signed)
[facility]                            CARDIOLOGY OFFICE NOTE   NAME:[name], [name] STINEMAN                      MRN:          [id number]  DATE:04/26/2008                            DOB:          [birth year]    IDENTIFICATION:  [name] is a 25 year old with a history of  dysautonomia.  I last saw her in August.   In the interval, she has done okay.  She still has some dizziness.  She  notes occasional leg cramping, some cramping on the inner aspect of her  left leg.  Also notes some body twitches that occur randomly last  seconds.   Her current medicines include  1. Florinef 0.05 b.i.d.  2. Zoloft 50.  3. Magnesium oxide daily.   PHYSICAL EXAMINATION:  GENERAL:  On exam, the patient is in no distress.  VITAL SIGNS:  Blood pressure lying 111/77 pulse 81, sitting 109/75,  pulse 98, the patient a little dizzy 0-minute standing a little  lightheaded 124/75, pulse 98, at 2 minutes, dizziness getting better  blood pressure 107/73, pulse 91, at 5 minutes 117/79, pulse 99.  NECK:  No bruits.  LUNGS:  Clear.  CARDIAC:  Regular rate and rhythm, S1-S2, no S3, no murmurs.  EXTREMITIES:  No palpable cords on left leg.  No edema.   IMPRESSION:  1. [name] is a 25 year old very anxious individual.  She has a      case of mild dysautonomia.  I think she is doing fairly well in the      current regimen, and I encouraged her to keep adequately hydrated,      keep taking salt.  I would not make any changes in her medicines      for now.  She asked if she could back off on the Zoloft and she      does not think it is helping.  We could back off but I would do it      very slowly pulling back at half a tablet for a couple of months      and then decreasing further.  She will reflect on this.  Otherwise,      I will set to see her in the spring or sooner if problems develop.  2. History of supraventricular tachycardia underwent slow pathway      modification 2004.   No recurrence.     Pricilla Riffle, MD, [facility]  Electronically Signed    PVR/MedQ  DD: 04/26/2008  DT: 04/27/2008  Job #: [id number]

## 2010-10-24 NOTE — Letter (Signed)
April 23, 2007    Noralyn Pick. Eden Emms, MD, [facility]  [address]  [address]  [place], [place] [address]   RE:  LORAN, AUGUSTE  MRN:  [id number]  /  DOB:  1986/03/17   Dear Theron Arista:   It was a pleasure to see, Marjani Kobel, at your request.  As you know,  she had undergone slow pathway modification about 4 years ago for AV  nodal re-entry.  At that time, she also had evidence of concomitant  dysautonomia.   She has been seeing Dr. Tenny Craw for these dysautonomic symptoms.  She tried  her on Florinef which she did not tolerate because of GI side-effects,  but has done better with them over the last couple of weeks.  Your note  and Dr. Tenny Craw' note well outline the story, so I am not going to recount  it here very much, except to say that there is a constellation of  symptoms involving palpitations, shortness of breath, tingling,  abdominal pain, headaches, weakness, fatigue, urinary frequency, etc.,  etc.  I agree that she likely has postural orthostatic tachycardia in  the context of a dysautonomia.  I have encouraged her to continue her  Florinef.  I have encouraged her to follow up with Dr. Tenny Craw and will  move that appointment out about 4 weeks as I have asked her to consider  going to [facility] for assistance in dealing with  the anxiety and stress that are part of her life and her social  situation.  I think that there is little likelihood that we will have an  impact on her symptoms until there is some way to relieve her stress.   Thank you very much for asking me to see her.    Sincerely,      Duke Salvia, MD, [facility]  Electronically Signed    SCK/MedQ  DD: 04/23/2007  DT: 04/24/2007  Job #: [id number]   CC:   Pricilla Riffle, MD, [facility]  Jeoffrey Massed, MD

## 2010-10-24 NOTE — Assessment & Plan Note (Signed)
[place] [facility]                       [place] CARDIOLOGY OFFICE NOTE   NAME:[name] [name]                      MRN:          [id number]  DATE:09/01/2007                            DOB:          03-10-1986    IDENTIFICATION:  [name] is a 25 year old whom I follow in clinic.  She has a history of  mild dysautonomia.  I last saw her in December.   In the interval, she still does not feel right.  She complains of  headaches that are frequent, bitemporal, some nausea.  She feels just  off balance.  She notes some leg pain.  She does worry a lot, and she  admits it.  She has some funny sensations in her epigastric area.  Says  she flushes a lot. Has checked her blood sugars. They have been as low  at times as 60s, even 40s at times.   CURRENT MEDICATIONS:  1. Florinef .005 at 10:00 a.m. and 4:00 p.m.  2. Implantaid birth control.  3. Zantac 300 nightly.  4. Hyoscyamine 0.125.   PHYSICAL EXAMINATION:  GENERAL:  The patient is in no distress.  VITAL SIGNS:  Initial blood pressure 110/78.  Orthostatics:  Lying  112/74, pulse 80,  patient dizzy; 106/69, pulse 93 sitting, patient  dizzy.  Standing at zero minutes 116/78, pulse 92, dizzy; 115/74 at 2  minutes, pulse 96, dizzy; 118/84, pulse 112 at 5 minutes, patient dizzy.  LUNGS:  Clear.  CARDIAC:  Regular rate and rhythm, S1-S2. No S3.  ABDOMEN:  Benign.  EXTREMITIES:  No edema.  SKIN:  Dry.   A 12-lead EKG shows sinus rhythm, 86 beats per minute, incomplete right  bundle branch block.   IMPRESSION:  Dysautonomia.  I have asked the patient to increase her  Florinef to 0.1 in the morning, 0.05 in the afternoon, and I would begin  Celexa 20, then 40 daily. We will check a BMET in 10 days along with a  TSH and CBC platelets.  Will follow up in 8 weeks. Encouraged her to  continue increasing her fluid intake and ample salt.   ADDENDUM:  I think her headaches may be explained some by the above as  well as some of her other symptoms.  I tried to reassure her on this.     Pricilla Riffle, MD, [facility]  Electronically Signed    PVR/MedQ  DD: 09/01/2007  DT: 09/01/2007  Job #: [id number]

## 2010-10-24 NOTE — Assessment & Plan Note (Signed)
[name] [name]                       [place] CARDIOLOGY OFFICE NOTE   NAME:[name], [name] PETRON                      MRN:          [id number]  DATE:12/08/2007                            DOB:          Jul 13, 1985    IDENTIFICATION:  [name] is a 25 year old woman who I follow, she has  a history of mild dysautonomia, and I last saw her in March.   When I saw her in March, I added Celexa to her regimen.  She took 1 dose  and stated it gave her a bad panic attack and she has not taken any more  of it.   NOTE:  She was in the ER recently one for a cellulitis of her left arm,  the other episode was a week ago when she had chest pain felt bad.  She  was seen and discharged home and felt like she had some  hyperventilation.   On talking to her, her symptoms are still, question if maybe a little  better.   CURRENT MEDICATIONS:  Florinef 0.1, Implanon contraceptives.   PHYSICAL EXAMINATION:  GENERAL:  The patient is in no distress at rest.  VITAL SIGNS:  Blood pressure lying 124/80, pulse 76; sitting 130/90,  pulse 74, and dizzy; standing at 2 minutes 110/90, pulse 76.  LUNGS:  Clear.  CARDIAC:  Regular rate and rhythm, S1 and S2.  No S3.  No murmurs.  ABDOMEN:  Benign, slightly obese.  EXTREMITIES:  No edema.   IMPRESSION:  1. Autonomic dysfunction, pulse has actually remained steady.  We did      not get a full heart rate panel, but there was no bump.  She is      mildly hypertensive and diastolic.  I would back down on the      Florinef to 0.05 twice a day.  Check labs in 10 days.  Her      potassium was running just a tad low.   She has been on Zoloft before for panic it helped, I would add this back  to her regimen.  She was on for a couple of years in the past and  stopped when she got pregnant, began 25 daily increasing 1 week to 50  daily.  She will come in for blood pressure check in 4 weeks for  orthostatics.     [name] Riffle, MD,  [facility]  Electronically Signed    PVR/MedQ  DD: 12/08/2007  DT: 12/09/2007  Job #: [id number]

## 2010-10-27 NOTE — Discharge Summary (Signed)
   NAME:  [name] [name], [name] [name]                          ACCOUNT NO.:  [id number]   MEDICAL RECORD NO.:  [id number]                   PATIENT TYPE:  INP   LOCATION:  A425                                 FACILITY:  [facility]   PHYSICIAN:  Langley Gauss, M.D.                DATE OF BIRTH:  [birth year]   DATE OF ADMISSION:  10/09/2002  DATE OF DISCHARGE:  10/12/2002                                 DISCHARGE SUMMARY   PROCEDURE:  Primary low transverse cesarean section delivery of a 5 pound 13  ounce female infant.   DISPOSITION:  At the time of discharge, the patient is to follow up in the  office in three days time for removal of staples from the Pfannenstiel  incision.   DISCHARGE MEDICATIONS:  1. Tylox #30 for pain relief.  2. Continue Metoprolol 25 mg p.o. b.i.d. for control of SVT.   LABORATORY DATA:  Admission hemoglobin and hematocrit 12.7 and 36.4.  Postoperative day #1, 11.6/34.3 and postoperative day #2, 11.0/32.3.   HOSPITAL COURSE:  See previous dictations.  The patient underwent primary  low transverse cesarean section on 10/09/02.  Postoperatively, the patient  and the infant did very well.  The infant did have a slight touch of  jaundice requiring serial monitoring.  The patient herself had return of  bowel function and passing flatus.  She was minimal ambulatory with the  Foley catheter left in place times the first 24 hours postoperatively until  it was finally removed, and the patient was able to ambulate.  The patient,  thereafter, did remain afebrile.  She was able to increase her activity.  Was able to tolerate regular general diet.  On 10/12/02, the patient was  prepared for discharge.  On examination at that time, the retention sutures  are removed.  The incision is well approximated with no erythema, no  significant induration.  No evidence of any infection.  Thus, the patient is  discharged home on that date of service to follow up in two days time for  staple  removal.                                               Langley Gauss, M.D.    DC/MEDQ  D:  10/21/2002  T:  10/21/2002  Job:  [id number]

## 2010-10-27 NOTE — Discharge Summary (Signed)
NAMESHEMICKA, [name] [name]               ACCOUNT NO.:  [id number]   MEDICAL RECORD NO.:  [id number]          PATIENT TYPE:  OBV   LOCATION:  LDR1                          FACILITY:  [facility]   PHYSICIAN:  Lazaro Arms, M.D.   DATE OF BIRTH:  February 05, 1986   DATE OF ADMISSION:  01/06/2006  DATE OF DISCHARGE:  07/31/2007LH                                 DISCHARGE SUMMARY   DISCHARGE DIAGNOSES:  1.  Intrauterine pregnancy at [redacted] weeks gestation, preterm labor.  2.  Previous C-section.   PROCEDURES:  Admission to the hospital with magnesium sulfate tocolysis and  discharge management.  Interpretation of NST.  Please refer to the  transcribed History and Physical and antepartum chart for details when  admitted to the hospital.   HOSPITAL COURSE:  The patient was admitted to hospital having regular  uterine contractions.  She was begun on magnesium sulfate tocolysis as she  failed home terbutaline therapy.  She was maintained on magnesium sulfate  for 24 hours and at that point was taken off the magnesium sulfate and  placed on oral terbutaline.  She has continued to do well on that.  She is  taking it every six hours.  She does have a history of SVT, but is having no  difficulty with Terbutaline.  We will maintain her on it for the next two  weeks and then remove that unless anything occurs between now and the.  We  will see her at the end of the week for follow up.      Lazaro Arms, M.D.  Electronically Signed     LHE/MEDQ  D:  01/08/2006  T:  01/08/2006  Job:  [id number]

## 2010-10-27 NOTE — Op Note (Signed)
NAME:  [name] [name], [name] [name]                          ACCOUNT NO.:  [id number]   MEDICAL RECORD NO.:  [id number]                   PATIENT TYPE:  EMS   LOCATION:  MAJO                                 FACILITY:  [facility]   PHYSICIAN:  Duke Salvia, M.D.               DATE OF BIRTH:  01-Dec-1985   DATE OF PROCEDURE:  11/23/2002  DATE OF DISCHARGE:                                 OPERATIVE REPORT   PREOPERATIVE DIAGNOSIS:  Supraventricular tachycardia.   POSTOPERATIVE DIAGNOSIS:  AV nodal re-entry tachycardia.   PROCEDURE PERFORMED:  Invasive electrophysiologic study, arrhythmia mapping  and radio frequency catheter ablation.   DESCRIPTION OF PROCEDURE:  Following obtaining of informed consent, the  patient was brought to the electrophysiology laboratory and placed on the  fluoroscopic table in supine position. After routine prep and drape, cardiac  catheterization was performed with local anesthesia and conscious sedation.  Noninvasive blood pressure monitoring, transcutaneous oxygen saturation  monitoring and end tidal CO2 monitoring were performed continuously  throughout the procedure.  Following the procedure, the catheters were  removed.  Hemostasis was obtained and the patient was transferred to the  floor in stable condition.   CATHETERS:  A 5 French quadripolar catheter was inserted via the left  femoral vein to the right ventricular apex.  A 5 French quadripolar catheter  was inserted via the left femoral vein to the AV junction.  A 6 French  octapolar catheter was inserted via the right femoral vein to the coronary  sinus.  A 7 French 4 mm deflectable tip catheter was inserted via the right  femoral vein using SL2 sheath mapping placed in the posterior septal space.   Surface leads I,  aVF, and V1 were monitored continuously throughout the  procedure.  Following insertion of the catheters, the stimulation protocol  included incremental atrial pacing, incremental ventricular  pacing, single  and double atrial extrastimuli at paced cycle length of 600 and 400 msec.   Single ventricular extrastimuli at paced cycle length of 600 msec.   RESULTS:  Surface electrocardiogram and basic intervals.  Sinus rhythm  initial and sinus rhythm final.  Cycle length 741 msec initial and 790 msec final.  PR interval is 122 msec initial and 120 msec final.  QRS duration is 71 msec initial and 78 msec final.  QT interval is 340 msec initial and 390 msec final.  P-wave duration is 79 msec initial and was not measured final.  Pre-excitation is absent and absent.  Bundle branch block is absent and absent.   AH interval is 89 msec initial and 61 msec final.  HV interval is 32 msec initial and 36 msec final.  His bundle duration is 12 msec initial and 12 msec final.   AV NODAL FUNCTION:  AV Wenckebach was less than 100 msec with further  _________ not made because of recurrent incessant tachycardia.  Retrograde  Wenckebach was less  than 270 msec.   AV nodal conduction was discontinued with echo beats both pre and  postablation.   Accessory pathway function:  No evidence of accessory pathway was  identified.   Arrhythmias induced:  Typical AV nodal re-entry tachycardia was reproducibly  induced with atrial stimulation with catheters.  Pacing from the coronary  sinus.  It was difficult to terminate on many occasions but ultimately CS  pacing at 200 to 220 ms was able to terminate it.   Characteristics of the tachycardia included (1) cycle length ranging from  350 to 249 msec.  (2)  Frequent 2:1 antegrade lock was seen.  (3)  Ventricular pacing during 2:1 block resulting in 1:1 conduction as expected.  (4)  A typical episode of tachycardia.  The HA interval was 2:1 tachycardia.  The HA interval was 121 msec.  The AH interval was 212 and a VA time was  approximately 25 msec.  An episode of 1:1 tachycardia, the HA time was 57  msec.  The AH time was 259 msec.  The VA time was  20 msec.   FLUOROSCOPY TIME:  A total of less than five minutes of fluoroscopy was  utilized at 15 to 7.5 frames per second.   RADIOFREQUENCY ENERGY:  A total of one minute and 55 seconds of radio  frequency energy was applied to sites were __________  potentials were  identified.  RF applications were difficult in terms of stability because  there was a very steep ridge on the tricuspid annulus at the anterior aspect  of the triangle of Alycia Rossetti whereby the catheter was would move from the  ventricular surface to the crevice around the coronary sinus os with minimal  manipulation of the catheter.  Sheath stability was essential.  We actually  came down on the site somewhat from a more anterior, i.e. His location but  the triangle of Alycia Rossetti was really quite large so that this was done without  difficulty.   IMPRESSION:  1. Normal sinus function.  2. Normal atrial function.  3. Inducible slow-fast AV nodal re-entry tachycardia with very rapid cycle     length up to 240 msec.  RF modification of the slow pathway eliminated     the inducibility of the tachycardia notwithstanding the presence of a     persistent echo.  4. Normal His-Purkinje system function.  5. No accessory pathway.  6. Normal ventricular response to programmed stimulation.   SUMMARY:  In conclusion the results of electrophysiologic testing identified  AV nodal re-entry tachycardia as the mechanism underlying Ms. Nance's  clinical arrhythmia.  A slow pathway modification eliminated the  inducibility of the tachycardia and modified the substrate of the slow  pathway.   We will plan to watch her overnight and anticipate discharge tomorrow.                                               Duke Salvia, M.D.    SCK/MEDQ  D:  11/23/2002  T:  11/23/2002  Job:  [id number]   cc:   Electrophysiology Laboratory

## 2010-10-27 NOTE — H&P (Signed)
NAME:  [name] [name], [name] [name]                          ACCOUNT NO.:  [id number]   MEDICAL RECORD NO.:  [id number]                   PATIENT TYPE:  OIB   LOCATION:  NA                                   FACILITY:  [facility]   PHYSICIAN:  Langley Gauss, MD                  DATE OF BIRTH:  August 05, 1985   DATE OF ADMISSION:  04/02/2002  DATE OF DISCHARGE:                                HISTORY & PHYSICAL   HISTORY OF PRESENT ILLNESS:  This is a 25 year old, G1, P0 who is currently  at 12+ weeks' gestation.  The patient's past medical history is pertinent  for a cardiac history.  The patient, by her history, is noted to have a five-  year history of supraventricular tachycardia which was well-controlled with  metoprolol.  She has had five emergency room visits only for this condition.  She has required no prior hospitalization.  The patient is under the care of  Dr. Graciela Husbands with [facility] [place].  Reportedly, the patient had been scheduled to undergo a surgical procedure  for correction of this hyperexcitable area of the heart for better control  of her cardiac rhythm.  However, this was canceled when she was found to be  pregnant at the time of the operative procedure.  The patient has been well-  maintained on metoprolol 25 mg p.o. b.i.d. prior to and continuing during  this pregnancy.  She most recently, on March 17, 2002, had episodic  tachycardia to 230 beats per minute.  The patient experiences this  palpitation and discomfort in the chest area when this occurs.  After about  15 minutes of this, the patient was taken to the emergency room by the  paramedics.  The patient states she was treated with adenosine as well as IV  Cardizem for rapid control of her rate.  She since that point has had no  recurrence.  Pertinently, the patient has not been seen by Dr. Graciela Husbands since  July 2003.  She is advised that she should see him in the very near future  to inform him of her  current pregnancy.  Hopefully, any changes in her  therapeutic regimen can be made at that time and any specific  recommendations for the course of labor and delivery.  By the patient's  history, she does not have any structural defect of the heart, rather this  is a functional supraventricular tachycardia.   Ultrasound is done on today's date which does reveal viable intrauterine  pregnancy.  Crown rump length consistent with 12 weeks' gestation giving her  an [facility] of Oct 15, 2002.  Langley Gauss, MD    DC/MEDQ  D:  04/02/2002  T:  04/03/2002  Job:  [id number]   cc:   Duke Salvia, M.D. [facility]

## 2010-10-27 NOTE — Op Note (Signed)
   NAME:  [name] [name], [name] [name]                          ACCOUNT NO.:  [id number]   MEDICAL RECORD NO.:  [id number]                   PATIENT TYPE:  INP   LOCATION:  LDR1                                 FACILITY:  [facility]   PHYSICIAN:  Langley Gauss, M.D.                DATE OF BIRTH:  [birth year]   DATE OF PROCEDURE:  10/09/2002  DATE OF DISCHARGE:                                 OPERATIVE REPORT   PROCEDURE:  Epidural.   DESCRIPTION OF PROCEDURE:  Appropriate informed consent was obtained.  The  patient was placed in the seated position.  Bony and landmarks identified.  The L2-L3 interspace was chosen.  The patient's back was sterilely prepped  and draped in the usual sterile manner and 5 mL of 1% lidocaine plain  injected at the site of the midline of the L2-L3 interspace to raise a small  skin wheal.  The 17-gauge Tuohy-Schliff was then utilized with loss-of-  resistance and an air-filled glass syringe to atraumatically identify entry  into the epidural space on the first attempt without difficulty.  Initial  test dose of 5 mL 1.5% lidocaine plus epinephrine injected through the  epidural needle.  No signs of CSF or intravascular injection obtained.  Thus, the epidural catheter is inserted to a depth of 5 cm. The epidural  needle was removed.  Aspiration test was negative.  A second test dose of 3  mL of 1.5% lidocaine plus epinephrine injected through the epidural  catheter.  Again no signs of CSF or intravascular injection obtained.  Thus  the patient was connected to the infusion pump containing standard mixture.  She will be given a bolus of 10 mL followed by continuous infusion of 12 mL  per hour.  Upon return to the supine position, the patient is noted to have  evidence of bilateral block setting up as seen with __________ heaviness in  her legs bilaterally.                                               Langley Gauss, M.D.    DC/MEDQ  D:  10/09/2002  T:  10/09/2002  Job:   [id number]

## 2010-10-27 NOTE — H&P (Signed)
[name] [name], [name] [name]               ACCOUNT NO.:  [id number]   MEDICAL RECORD NO.:  [id number]          PATIENT TYPE:  OIB   LOCATION:  LDR1                          FACILITY:  [facility]   PHYSICIAN:  Lazaro Arms, M.D.   DATE OF BIRTH:  [birth year]   DATE OF ADMISSION:  01/06/2006  DATE OF DISCHARGE:  LH                                HISTORY & PHYSICAL   [name] [name] is a 25 year old white female gravida 2, para 1, estimated delivery  of February 12, 2006 currently at 34-5/[redacted] weeks gestation who is admitted  once again this week with irregular uterine contractions.  She was here  three days ago and subsequently went home after IV hydration, tocolytics,  and sedation.  However, today she comes in and she is given a dose of  subcutaneous terbutaline and she still is having contractions every three  minutes and moderate intensity.  Her cervix has not changed but she is  having extremely regular pattern.  As a result, she is started on magnesium  sulfate tocolysis.   PAST MEDICAL HISTORY:  Significant for supraventricular tachycardia.   PAST SURGICAL HISTORY:  Cesarean section as well as an ablation for  supraventricular tachycardia.   MEDICATIONS:  Metoprolol 25 mg a day.   ALLERGIES:  None.   OB HISTORY:  She had a cesarean section in 2004 5 pounds and 3 ounces.   PHYSICAL EXAMINATION:  PELVIC:  Her cervix, again, is long, thick, and  closed.  ABDOMEN:  Benign.  There is no bleeding, no ruptured membranes.  EXTREMITIES:  Warm with 1+ edema.   IMPRESSION:  1.  Intrauterine pregnancy at 34-5/[redacted] weeks gestation.  2.  Preterm labor.  3.  Supraventricular tachycardia.   PLAN:  Patient is admitted, placed on magnesium sulfate tocolysis and we  will manage her preterm labor from there, most probably with terbutaline.  If she does not tolerate that then that will have to be stopped as well.      Lazaro Arms, M.D.  Electronically Signed    LHE/MEDQ  D:  01/07/2006  T:  01/07/2006   Job:  [id number]

## 2010-10-27 NOTE — H&P (Signed)
[name] [name], [name] [name]               ACCOUNT NO.:  [id number]   MEDICAL RECORD NO.:  [id number]          PATIENT TYPE:  OIB   LOCATION:  A415                          FACILITY:  [facility]   PHYSICIAN:  Tilda Burrow, M.D. DATE OF BIRTH:  1986/01/19   DATE OF ADMISSION:  01/03/2006  DATE OF DISCHARGE:  LH                                HISTORY & PHYSICAL   REASON FOR ADMISSION:  Pregnancy at 34 weeks 1 day, previous C-section with  uterine contractions.   HISTORY OF PRESENT ILLNESS:  [name] [name] is a gravida 2, para 1, [facility] September 4,  who is admitted with abdominal cramping since early in the evening.   MEDICAL HISTORY:  Positive for supraventricular tachycardia.   SURGICAL HISTORY:  1.  Positive for C-section.  2.  Heart ablation for the SVT.   MEDICATIONS:  She is on metoprolol 25 mg.   FAMILY HISTORY:  Positive for hypertension, diabetes, depression.   PHYSICAL EXAM:  Cervix is closed.  There is a regular uterine pattern.  Fetal heart rate pattern is stable.  There is 3+ ketones in her urine.   PLAN:  We are going to admit.  IV hydration and tocolytics and sedation.      [name] [name], Lanier Clam      Tilda Burrow, M.D.  Electronically Signed    DL/MEDQ  D:  04/54/0981  T:  01/03/2006  Job:  [id number]

## 2010-10-27 NOTE — Procedures (Signed)
   NAME:  [name] [name], [name] [name]                          ACCOUNT NO.:  [id number]   MEDICAL RECORD NO.:  [id number]                   PATIENT TYPE:  EMS   LOCATION:  ED                                   FACILITY:  [facility]   PHYSICIAN:  Edward L. Juanetta Gosling, M.D.             DATE OF BIRTH:  July 17, 1985   DATE OF PROCEDURE:  DATE OF DISCHARGE:  03/12/2002                                EKG INTERPRETATION   ELECTROCARDIOGRAM:  Sinus rhythm with a rate of about 110.  There is right  atrial enlargement.  Small Q-waves seen inferiorly and laterally which could  indicate a previous inferolateral myocardial infarction.  Clinical  correlation is suggested.  However, these are indeed small Q-waves and may  be of no significance.   Abnormal electrocardiogram.                                               Oneal Deputy. Juanetta Gosling, M.D.    ELH/MEDQ  D:  03/16/2002  T:  03/17/2002  Job:  [id number]

## 2010-10-27 NOTE — H&P (Signed)
NAME:  [name] [name], [name] [name]                          ACCOUNT NO.:  [id number]   MEDICAL RECORD NO.:  [id number]                   PATIENT TYPE:  INP   LOCATION:  LDR1                                 FACILITY:  [facility]   PHYSICIAN:  Langley Gauss, M.D.                DATE OF BIRTH:  [birth year]   DATE OF ADMISSION:  10/09/2002  DATE OF DISCHARGE:                                HISTORY & PHYSICAL   HISTORY OF PRESENT ILLNESS:  This 25 year old gravida 1, para 0, at [redacted] weeks  gestation is admitted for induction of labor.  The patient's prenatal course  is uncomplicated but she is noted to have pertinent medical history.  The  patient has a 5-year history of SVT. She is followed by Dr. Graciela Husbands at [facility].  The patient had previously had ablative therapy planned, but was  subsequently found to be pregnant.  The patient, otherwise, is fairly well  maintained on metoprolol 25 mg p.o. b.i.d.; however, periodically during the  pregnancy she was noncompliant with this. The patient has had no  hospitalizations for the SVT, but has had multiple EMS and emergency room  visits for these episodes. The patient states that she cannot identify any  precipitating factor, but heart rate will occasionally increase to 150-200  beats per minute and typically subsided spontaneously or with vagal  maneuvers. During episodes where it has not subsided the patient has  received IV adenosine with good results.  The patient has no other  significant medical history.   ALLERGIES:  No known drug allergies.   MEDICATIONS:  Metoprolol 25 mg p.o. b.i.d. The patient has been compliant  with this during this third trimester of pregnancy with no resultant  episodes occurring.   PHYSICAL EXAMINATION:  VITAL SIGNS:  5 feet 2 inches, 180, 109/73, 74, 18,  HEENT AND NECK:  No adenopathy. Neck is supple.  Thyroid is not palpable.  Mucous membranes are moist.  LUNGS:  Clear.  CARDIOVASCULAR:  Regular rate and rhythm.  ABDOMEN:  The abdomen is soft and nontender.  No surgical scars are  identified.  She is vertex presentation by Leopold's maneuvers.  EXTREMITIES:  Extremities noted to be normal.  PELVIC:  Normal external genitalia.  No bleeding or ruptured membranes  identified.  Cervix is 2 cm dilated, 60% effaced, 0 station, vertex  presentation.  External fetal monitor reveals a reassuring fetal heart rate  with normal long-term variability, accelerations noted.   ASSESSMENT:  A 39 week intrauterine pregnancy with favorable cervix.  We  will admit for induction so that her cardiovascular status can be monitored  in a controlled situation.  Amniotomy is performed with findings of light  meconium stained amniotic fluid and a fetal scalp electrode is placed. The  patient will be observed times several hours to see if she spontaneously  enters labor. If she does not, we will proceed with Pitocin  induction.   The patient's pelvis is noted to be clinically adequate and estimated fetal  weight is 7 pounds.                                              Langley Gauss, M.D.   DC/MEDQ  D:  10/09/2002  T:  10/09/2002  Job:  [id number]

## 2010-10-27 NOTE — Op Note (Signed)
   NAME:  [name] [name], MARUYAMA                          ACCOUNT NO.:  [id number]   MEDICAL RECORD NO.:  [id number]                   PATIENT TYPE:  INP   LOCATION:  A425                                 FACILITY:  [facility]   PHYSICIAN:  Langley Gauss, M.D.                DATE OF BIRTH:  1985/10/05   DATE OF PROCEDURE:  DATE OF DISCHARGE:  10/12/2002                                 OPERATIVE REPORT   PROCEDURE PERFORMED:  Placement of epidural, 430.04.   SUMMARY:  The patient was placed in the seated position.  Bony landmarks  were identified.  The L3-L4 interspace was chosen.  The back was sterilely  prepped.  A 17-gauge Tuohy Schliff needle was utilized with loss of  resistance and air-filled glass syringe to identify entry into the epidural  space.  Initial test dose of 5 mL of 1.5% lidocaine plus epinephrine through  the epidural needle.  The epidural catheter was threaded to a depth of 5 cm.  The epidural needle was withdrawn.  A second test dose of 2 mL of 1.5%  lidocaine plus epinephrine was injected through the catheter.  No signs of  CSF or intravascular injection obtained.  The catheter was threaded into  place.  The patient connected to the infusion pump with a standard mixture.  She will be treated with a bolus of 10 mL, followed by continuous infusion  at a rate of 12 mL per hour.  Upon return to the supine position, there was  evidence of a bilateral epidural block, which should provide adequate  __________ analgesia.                                               Langley Gauss, M.D.    DC/MEDQ  D:  10/21/2002  T:  10/22/2002  Job:  [id number]

## 2010-10-27 NOTE — Discharge Summary (Signed)
   NAME:  [name] [name], [name] [name]                          ACCOUNT NO.:  [id number]   MEDICAL RECORD NO.:  [id number]                   PATIENT TYPE:  EMS   LOCATION:  MAJO                                 FACILITY:  [facility]   PHYSICIAN:  Duke Salvia, M.D.               DATE OF BIRTH:  Nov 25, 1985   DATE OF ADMISSION:  11/23/2002  DATE OF DISCHARGE:  11/24/2002                                 DISCHARGE SUMMARY   PRIMARY DIAGNOSIS:  Supraventricular tachycardia.   HISTORY OF PRESENT ILLNESS:  This is an 25 year old female who was seen  following delivery of a child for SVT that is ________ positive and diuretic  causative.  Patient was admitted to the emergency room on November 22, 2002 in  the p.m. for a complaint of SVT.  She had been taking metoprolol 25 mg twice  a day.  She was status post C-section.  Orthostatic lightheadedness and  patent foramen ovale.  Patient was admitted and underwent successful slow  pathway modification.  She had an uneventful post-op course and although she  had a complaint of mild chest pain 4/10 in the evening on June 14 as well as  on the morning of June 15 she had no EKG changes.  In fact a 2D echo was  performed which showed no pericardial effusion.   She was then discharged later the same day to home in stable condition on a  coated aspirin 325 mg daily for six weeks and antibiotic prophylaxis for the  next three months.  She was to take Tylenol 1-2 tablets every four to six  hours as needed for pain.  No heavy lifting or strenuous activity for four  days, no driving for two.  Low fat, low cholesterol diet.  She is to call if  she develops any drainage in her groin.  She is to follow with Dr. Graciela Husbands  August 18 at 11:45 a.m.     Chinita Pester, C.R.N.P. [facility]                 Duke Salvia, M.D.    DS/MEDQ  D:  11/24/2002  T:  11/24/2002  Job:  [id number]   cc:   Duke Salvia, M.D.   Milana Na, Dr.  [facility]    cc:   Duke Salvia,  M.D.   Milana Na, Dr.  [facility]

## 2010-10-27 NOTE — Op Note (Signed)
NAME:  [name] [name], FARNAM                          ACCOUNT NO.:  [id number]   MEDICAL RECORD NO.:  [id number]                   PATIENT TYPE:  INP   LOCATION:  A425                                 FACILITY:  [facility]   PHYSICIAN:  Langley Gauss, M.D.                DATE OF BIRTH:  06/24/1985   DATE OF PROCEDURE:  10/09/2002  DATE OF DISCHARGE:  10/12/2002                                 OPERATIVE REPORT   DIAGNOSES:  1. A 39-week intrauterine pregnancy.  2. Prolonged deceleration of the fetal heart rate.  3. Meconium-stained amniotic fluid.  4. Maternal history of supraventricular tachycardia.   PROCEDURE PERFORMED:  Primary low-transverse cesarean section and delivery  of 5 lb, 13 oz, female infant.   ANALGESIA:  Epidural placed by Dr. Roylene Reason. Lisette Grinder, managed by anesthesia  for the operative procedure.   ESTIMATED BLOOD LOSS:  800 mL.   DRAINS:  Foley catheter straight drainage.  JP catheter within the  subcutaneous space.   PEDIATRICIAN:  Francoise Schaumann. Halm, D.O.   FINDINGS:  The findings at the time of surgery include a very thin umbilical  cord with a nuchal cord x1 with compression and a cord around the left leg  x1.   SUMMARY:  The patient was taken to the operating room.  Vital signs were  stable.  The patient was placed on the OR table with a left lateral tilt and  was prepped and draped in the usual sterile manner.  An epidural catheter  was managed per the anesthesia staff.  After assurance of surgical  analgesia, the patient was sterilely prepped and draped.  A curved knife was  used to incise the Pfannenstiel incision through the skin and dissected down  to the fascial plane utilizing a sharp knife, cauterizing bleeders along the  way.  The fascia was then ________incised in a transverse curvilinear manner  while dissecting off the underlying rectus muscle in the avascular plane.  The fascial edges were then grasped using straight Kocher clamps.  The  fascia was  dissected off the underlying rectus muscle in the midline, both  superiorly and inferiorly, to increase her operative exposure.  The rectus  muscle was then bluntly separated.  The peritoneal cavity was atraumatically  bluntly entered at the superior-most portion of the incisions.  The  peritoneal incision was then extended superiorly and inferiorly.  Inferiorly, we indirectly visualized the bladder to avoid infection and to  allow entry.  The bladder blade was then placed.  The lower uterine segment  was identified.  A bladder flap was created from the vesicouterine fold  utilizing sharp dissection.  A sharp knife was then used to score a low  transverse uterine incision in the lower uterine segment.  The amniotic sac  was encountered in the midline.  The index finger was then used to bluntly  extend the uterine incision bilaterally.  Noted was  thin, green, meconium-  stained amniotic fluid.  After extension of the uterine incision, my right  hand then reached into the uterine cavity.  The head of the infant was  flexion elevated with gentle traction.  The disposable Silastic suction was  applied to the fetal vertex and connected to wall suction.  Anal traction  with the suction, combined with fundal pressure resulted in easy delivery of  the vertex through the uterine incision without extension.  DeLee suctioning  was performed immediately, connected to wall suction ________ obtaining  about 2 mL of very clear, thin, meconium-stained amniotic fluid.  Gentle  traction combined with renewed fundal pressure resulted in the delivery of  the remainder of the infant without difficulty.  The nuchal cord x1 was  reduced, as well as the cord around the left leg x1.  The umbilical cord was  then milked towards the infant.  The cord was delivered, clamped, and cut,  and the infant was handed to the awaiting pediatrician and noted to be  vigorous with a spontaneous breathing cry.  Arterial cord gas  and cord  bloods were then obtained from the umbilical cord.  Gentle traction on the  umbilical cord resulted in separation, which upon examination appears to be  an intact placenta with associated three-vessel umbilical cord.  The uterus  was then exteriorized.  Intrauterine exploration reveals no retained  placental fragments.  The uterine incision was noted to have not extended.  The uterine incision was then closed in two layers of 0 chromic in a running  locked fashion, the second layer being an imbricated layer.  This resulted  in excellent hemostasis.  The tubes and ovaries were noted to be normal in  appearance.  The cul-de-sac was irrigated free of all clots.  The uterus was  returned to the pelvic cavity.  Sponge, needle, and instrument counts were  correct x2 at this point.  The peritoneal edges were grasped using Kelly  clamps, and the peritoneum was closed with a continuous running 0 chromic  suture.  The rectus muscles were reapproximated in the midline utilizing  continuous running 0 chromic suture.  The fascia was then closed with a  continuous running #1 PDS suture.  The subcutaneous bleeders were  cauterized.  A JP drain was placed in the subcutaneous space with a separate  exit wound to the left apex of the incision.  The JP drain was sutured in  place.  Three interrupted #1 PDS sutures in a vertical mattress fashion were  then placed to facilitate reapproximation of the skin edges.  The skin edges  were then completely reapproximated utilizing skin staples.  To facilitate  postoperative analgesia, a total of 30 mL of 0.5% Bupivacaine plain was  injected to a small skin wheal along the entirety of the incision.  The  patient was then taken to the recovery room in a stable condition, at which  time the operative findings were discussed with the patient's waiting  family.  The infant reportedly is doing very well in the nursery.                                               Langley Gauss, M.D.    DC/MEDQ  D:  10/21/2002  T:  10/22/2002  Job:  [id number]

## 2010-10-27 NOTE — Consult Note (Signed)
[name] [name], [name] [name]               ACCOUNT NO.:  [id number]   MEDICAL RECORD NO.:  [id number]          PATIENT TYPE:  OIB   LOCATION:  A415                          FACILITY:  [facility]   PHYSICIAN:  Lazaro Arms, M.D.   DATE OF BIRTH:  March 14, 1986   DATE OF CONSULTATION:  01/26/2006  DATE OF DISCHARGE:  01/26/2006                                   CONSULTATION   HISTORY OF PRESENT ILLNESS/HOSPITAL COURSE:  Naila is a 25 year old gravida  2, para 1, abortus 0 with estimated date of delivery of February 17, 2006.  Currently at 36-5/[redacted] weeks gestation, who presents to labor and delivery  complaining of uterine contractions. She was found to be having contractions  about every 6 to 10 minutes. Had a reactive NST. No bleeding. No gushes of  fluid. She was observed for several hours. She was staged to be 2 when she  came in and I checked her this morning after about 10 or 11 hours and she is  again having some irregular uterine activity, mild. She is talking through  them easily and there is no cervical change. In fact, I would just call her  a tight fingertip and there is no fetal vertex in the pelvis. Unfortunately,  she continues to have a great deal of uterine activity and I have given her  a prescription for some Lortab to take as needed and to still take  terbutaline every now and again as needed, just to try to diminish her  uterine activity from baseline. She is scheduled for a cesarean section on  the 29th. If certainly, we need to do something before then we will but  otherwise, we will see her in the office this coming week.      Lazaro Arms, M.D.  Electronically Signed     LHE/MEDQ  D:  01/26/2006  T:  01/26/2006  Job:  [id number]

## 2010-10-27 NOTE — Op Note (Signed)
[name] [name], [name] [name]               ACCOUNT NO.:  [id number]   MEDICAL RECORD NO.:  [id number]          PATIENT TYPE:  INP   LOCATION:  A413                          FACILITY:  [facility]   PHYSICIAN:  Lazaro Arms, M.D.   DATE OF BIRTH:  11-29-1985   DATE OF PROCEDURE:  DATE OF DISCHARGE:                                 OPERATIVE REPORT   PREOPERATIVE DIAGNOSIS:  1. Intrauterine pregnancy at [redacted] weeks gestation.  2. Previous cesarean section.   POSTOPERATIVE DIAGNOSIS:  1. Intrauterine pregnancy at [redacted] weeks gestation.  2. Previous cesarean section.   OPERATION/PROCEDURE:  Repeat cesarean section.   SURGEON:  Lazaro Arms, M.D.   ANESTHESIA:  Spinal.   FINDINGS:  Over a low transverse hysterotomy incision was delivered a viable  female infant at 42 with Apgars of 9 and 9 with weight to be determined in  the nursery.  There was three-vessel cord. Cord blood and cord gas were  sent.  Placenta was normal and delivered intact.  The uterus, tubes and  ovaries were normal.   DESCRIPTION OF PROCEDURE:  The patient was taken to the operating room,  placed in the sitting position where she underwent a spinal anesthetic.  She  was then placed in the supine position with a roll under her right hip.  She  was prepped and draped in the usual sterile fashion.  A Pfannenstiel skin  incision was made and carried down sharply through the rectus fascia which  was scored in the midline, extended laterally.  Fascia was taken off the  muscles superiorly and inferiorly without difficulty.  The muscles were  divided.  The peritoneal cavity was entered.  A bladder blade was placed.  The vesicouterine serosa flap was created.  A low transverse hysterotomy  incision was made.  Over this incision was delivered a viable female infant at  62 with Apgars of 9 and 9 with weight determined in the nursery. The  infant was taken to Dr. Milford Cage who was in attendance for routine neonatal  resuscitation.  Cord blood  and cord gas was sent.  Placenta was delivered  spontaneously.  The uterus contracted down nicely.  The uterus was wiped  clean with clean lap pad and exteriorized. It was closed in two layers,  first being a running interlocking layer.  The second being imbricating  layer.  There was good hemostasis.  The peritoneal cavity was irrigated  vigorously.  The uterus was placed in the peritoneal cavity. The muscles and  peritoneum were reapproximated with 0 chromic.  The fascia was closed with 0  Vicryl running suture.  Subcutaneous tissue was made hemostatic and  irrigated.  Skin was closed using skin staples.  The patient tolerated the  procedure well.  She experienced 500 mL of blood loss and was taken to the  recovery room in good stable condition.  All counts were correct x3.  She  received Ancef prophylactically.      Lazaro Arms, M.D.  Electronically Signed     LHE/MEDQ  D:  02/06/2006  T:  02/06/2006  Job:  [id number] 

## 2010-10-27 NOTE — Discharge Summary (Signed)
[name] [name], [name] [name]               ACCOUNT NO.:  [id number]   MEDICAL RECORD NO.:  [id number]          PATIENT TYPE:  INP   LOCATION:  A413                          FACILITY:  [facility]   PHYSICIAN:  Lazaro Arms, M.D.   DATE OF BIRTH:  Mar 20, 1986   DATE OF ADMISSION:  02/06/2006  DATE OF DISCHARGE:  08/31/2007LH                                 DISCHARGE SUMMARY   DISCHARGE DIAGNOSES:  1. Status post a repeat cesarean section.  2. Unremarkable postoperative course.   PROCEDURE:  Repeat cesarean section.   Please refer to the transcribed history and physical in the antepartum chart  for details of admission to the hospital.   HOSPITAL COURSE:  The patient had an unremarkable postoperative course.  She  tolerated clear liquids and a regular diet.  She voided without symptoms and  had return of normal bowel function.  Her incision was clean, dry, and  intact.  Her abdominal exam was normal postoperative.  Her hemoglobin and  hematocrit on postoperative day #1 was 10.9 and 31.8 with a white count of  10,300.  She remained afebrile throughout the postoperative course.  She was  extensively ambulatory without symptoms.  She was discharged to home on the  morning of postoperative day #2 in good stable condition to follow up in the  office on September 5.  She was discharged with 40 Tylox tablets and 20  Motrin tablets for postoperative pain management, and she was given  instructions and precautions for calling the office prior to her scheduled  visit.      Lazaro Arms, M.D.  Electronically Signed     LHE/MEDQ  D:  02/08/2006  T:  02/08/2006  Job:  [id number]

## 2010-10-27 NOTE — H&P (Signed)
NAMETOBY, AYAD               ACCOUNT NO.:  [id number]   MEDICAL RECORD NO.:  [id number]          PATIENT TYPE:  AMB   LOCATION:  DAY                           FACILITY:  [facility]   PHYSICIAN:  Lazaro Arms, M.D.   DATE OF BIRTH:  11-Mar-1986   DATE OF ADMISSION:  02/06/2006  DATE OF DISCHARGE:  LH                                HISTORY & PHYSICAL   [name] [name] is a 25 year old white female, gravida 2, para 1 estimated date of  delivery of February 12, 2006 currently at [redacted] weeks gestation, who is  admitted for repeat cesarean section.  She had a cesarean section performed  by Dr. Lisette Grinder back in 2004 and declines trial of labor and is for a repeat.   PAST MEDICAL HISTORY:  Significant for a supraventricular tachycardia and  she takes metoprolol for that.   PAST SURGICAL HISTORY:  1. Cesarean section.  2. She also had an electrical ablation for her SVT.   ALLERGIES:  None.   MEDICATIONS:  Metoprolol.   OBSTETRICAL HISTORY:  Her pregnancy has been complicated by pre-term labor  this pregnancy which was treated successfully with terbutaline.  Blood type  is O positive.  Antibody screen is negative.  Hepatitis B is negative.  HIV  is nonreactive.  HSV-2 is negative.  Serology is nonreactive.  Pap smear was  normal.  GC and Chlamydia were negative x2.  Group B strep was positive.  Glucola is 138.   PHYSICAL EXAMINATION:  HEENT:  Unremarkable.  Thyroid is normal.  LUNGS:  Clear.  HEART:  Regular rate and rhythm without murmurs, gallops, or rubs.  BREASTS:  Deferred.  ABDOMEN:  Benign.  Fundal height in the office is 37 cm.  PELVIC:  Cervix is long, thick, closed and firm.  EXTREMITIES:  1+ edema.   IMPRESSION:  1. At [redacted] weeks gestation.  2. Previous cesarean section.  3. Supraventricular tachycardia.   PLAN:  The patient is admitted for repeat cesarean section.  She understands  the risks, benefits, and indications and will proceed.      Lazaro Arms, M.D.  Electronically Signed     LHE/MEDQ  D:  02/05/2006  T:  02/05/2006  Job:  [id number]

## 2010-10-27 NOTE — Group Therapy Note (Signed)
NAME:  [name] [name], [name] [name]                          ACCOUNT NO.:  [id number]   MEDICAL RECORD NO.:  [id number]                   PATIENT TYPE:  EMS   LOCATION:  MAJO                                 FACILITY:  [facility]   PHYSICIAN:  Langley Gauss, M.D.                DATE OF BIRTH:  1985/10/01   DATE OF PROCEDURE:  DATE OF DISCHARGE:  08/03/2002                                   PROGRESS NOTE   URGENT PROGRESS NOTE:   BRIEF HISTORY/REASON FOR ADMISSION:  The patient is a 25 year old gravida 1  para 0 with an EDC of 10/2002.  The patient's prenatal course was  complicated only by her __________  of a 5-year history of supraventricular  tachycardia well controlled with metoprolol.  The patient was going to have  an ablative therapy done for this so she would not have to take medication  for this condition at which time she found out she was pregnant.  The  patient is under the care of Dr. Graciela Husbands, [facility] in [place].  She is fairly well maintained if she is compliant with her metoprolol for  which she takes 25 mg p.o. b.i.d.  The patient has been seen by Dr. Graciela Husbands  during the pregnancy at which time this medication was continued.  He did  send [place] a letter and I have discussed her case with him.  The patient is  noted to do fairly well with her condition.  She has never experienced any  significant cardiac compromise.  She is noted to have a resting tachycardia  of 80-100 beats per minute and at times when the episodes of the SVT occur  her heart rate may go upwards about 200 beats per minute.  Typically these  episodes subside spontaneously within 15 minutes.  She has been instructed  on the use of vagal maneuvers in an effort to break these patterns and when  these have been unsuccessful, the patient has on several occasions contacted  EMS at which time rarely has she ever been transported to [facility] but rather the rhythm has converted by the time of her  evaluation.   The patient has had serial ultrasounds which have documented adequate fetal  growth and normal anatomic survey.  The patient has had a total of five or  six episodes of this SVT during the past year.  She has improved now during  the third trimester when she has been compliant with the metoprolol and has  not had any emergency room visits or any episodes of the SVT during the  third trimester.  Upon discussion of the case with Dr. Graciela Husbands, he does not  feel it is necessary to proceed with any cardiac monitoring during pregnancy  or more specifically, no cardiac monitoring is required during labor and  delivery and the patient does not have any contraindications to labor.  Should an SVT  occur during the course of labor it may very well  spontaneously subside and if it persists greater than 15 minutes in duration  the patient will experience some anxiety in which she perceives as shortness  of breath and the arrhythmia can be easily converted through the use of IV  Adenosine.  The patient has received Adenosine on several previous occasions  with immediate spontaneous return to normal sinus rhythm.  I have discussed  with the patient the course of labor and highly recommended that she proceed  with placement of epidural which will minimize maternal stress levels and  hopefully also allow [place] to avoid any cardiac symptoms during the course of  labor.                                               Langley Gauss, M.D.   DC/MEDQ  D:  09/27/2002  T:  09/27/2002  Job:  [id number]   cc:   [facility]

## 2010-11-08 ENCOUNTER — Encounter: Payer: Self-pay | Admitting: Internal Medicine

## 2010-11-17 ENCOUNTER — Ambulatory Visit: Payer: Self-pay | Admitting: Internal Medicine

## 2010-12-18 ENCOUNTER — Encounter: Payer: Self-pay | Admitting: Internal Medicine

## 2010-12-18 ENCOUNTER — Ambulatory Visit (INDEPENDENT_AMBULATORY_CARE_PROVIDER_SITE_OTHER): Payer: BC Managed Care – PPO | Admitting: Internal Medicine

## 2010-12-18 DIAGNOSIS — R5383 Other fatigue: Secondary | ICD-10-CM

## 2010-12-18 DIAGNOSIS — G901 Familial dysautonomia [Riley-Day]: Secondary | ICD-10-CM

## 2010-12-18 DIAGNOSIS — G9009 Other idiopathic peripheral autonomic neuropathy: Secondary | ICD-10-CM

## 2010-12-18 DIAGNOSIS — G909 Disorder of the autonomic nervous system, unspecified: Secondary | ICD-10-CM

## 2010-12-18 NOTE — Patient Instructions (Addendum)
Lab work today TSH Vitamin D level. We will call you with results. Your physician wants you to follow-up in: 4 months You will receive a reminder letter in the mail two months in advance. If you don't receive a letter, please call our office to schedule the follow-up appointment.

## 2010-12-21 ENCOUNTER — Other Ambulatory Visit: Payer: Self-pay | Admitting: *Deleted

## 2010-12-21 DIAGNOSIS — G901 Familial dysautonomia [Riley-Day]: Secondary | ICD-10-CM | POA: Insufficient documentation

## 2010-12-21 MED ORDER — METOPROLOL SUCCINATE ER 25 MG PO TB24: 12.5000 mg | ORAL_TABLET | Freq: Two times a day (BID) | ORAL | Status: DC

## 2010-12-21 MED ORDER — FLUDROCORTISONE ACETATE 0.1 MG PO TABS: 0.1000 mg | ORAL_TABLET | Freq: Every day | ORAL | Status: DC

## 2010-12-21 NOTE — Progress Notes (Signed)
HPI  Patient is a 25 year old with a history of dysatonomia.  Since seen she is no longer taking her medicines.  She is separated from her hsband and had not checked on coverage now for meds. She is working at [employer], on her feet a lot during the day. Admits to not always keeping up with fluids.   Does note dizziness, fatigue.  NO syncope.  Does note headaches posterior on L side of head( sharp) Also complains of weight gain.  Eats at work.  Allergies  Allergen Reactions  . Vancomycin     Current Outpatient Prescriptions  Medication Sig Dispense Refill  . Cholecalciferol (VITAMIN D3) 1000 UNITS CAPS Take 1 tablet by mouth daily.        . diazepam (VALIUM) 10 MG tablet Take 5 mg by mouth daily.        . fludrocortisone (FLORINEF) 0.1 MG tablet Take by mouth daily. 1/2 tablet at 8 am and 1/2 tablet at 2 pm.       . gabapentin (NEURONTIN) 100 MG capsule Take 100 mg by mouth at bedtime.        . metoprolol succinate (TOPROL-XL) 25 MG 24 hr tablet Take 12.5 mg by mouth 2 (two) times daily.        . sertraline (ZOLOFT) 50 MG tablet Take 50 mg by mouth daily.          Past Medical History  Diagnosis Date  . Dysautonomia   . SVT (supraventricular tachycardia) 2004    S/P slow path modification    Past Surgical History  Procedure Date  . Cesarean section   . Ventricular ablation surgery     For supraventricular tachycardia    Family History  Problem Relation Age of Onset  . Hypertension Other   . Diabetes Other   . Depression Other     History   Social History  . Marital Status: Legally Separated    Spouse Name: N/A    Number of Children: N/A  . Years of Education: N/A   Occupational History  . Not on file.   Social History Main Topics  . Smoking status: Never Smoker   . Smokeless tobacco: Not on file  . Alcohol Use: No  . Drug Use: Not on file  . Sexually Active: Not on file   Other Topics Concern  . Not on file   Social History Narrative  . No narrative on  file    Review of Systems:  All systems reviewed.  They are negative to the above problem except as previously stated.  Vital Signs: BP 105/73  Pulse 107  Ht 5\' 2"  (1.575 m)  Wt 150 lb (68.04 kg)  BMI 27.44 kg/m2  Physical Exam  Patient is in NAD  HEENT:  Normocephalic, atraumatic. EOMI, PERRLA.  Neck: JVP is normal. No thyromegaly. No bruits.  Lungs: clear to auscultation. No rales no wheezes.  Heart: Regular rate and rhythm. Normal S1, S2. No S3.   No significant murmurs. PMI not displaced.  Abdomen:  Supple, nontender. Normal bowel sounds. No masses. No hepatomegaly.  Extremities:   Good distal pulses throughout. No lower extremity edema.  Musculoskeletal :moving all extremities.  Neuro:   alert and oriented x3.  CN II-XII grossly intact.  EKG:  NSR.  88 bpm.   Assessment and Plan:

## 2010-12-21 NOTE — Assessment & Plan Note (Signed)
[name] is not on medicines now.  Orthostatics show evidence of POTS (Postural Orthostatic Tachycardia Syndrome).  I would recomm restarting her medicines.  I also encouraged her to increase fluid and salt intake.  She should consider support stockings, getting off her feet during the day, abdominal binder (spanx). I will set to see her in the fall. Will check TSH (admits to thinnng hair) and Vit D

## 2011-02-27 ENCOUNTER — Emergency Department (HOSPITAL_COMMUNITY)
Admission: EM | Admit: 2011-02-27 | Discharge: 2011-02-27 | Disposition: A | Discharge status: Home / Self Care | Payer: BC Managed Care – PPO | Attending: Emergency Medicine | Admitting: Emergency Medicine

## 2011-02-27 ENCOUNTER — Emergency Department (HOSPITAL_COMMUNITY): Payer: BC Managed Care – PPO

## 2011-02-27 ENCOUNTER — Encounter (HOSPITAL_COMMUNITY): Payer: Self-pay | Admitting: *Deleted

## 2011-02-27 DIAGNOSIS — R110 Nausea: Secondary | ICD-10-CM | POA: Insufficient documentation

## 2011-02-27 DIAGNOSIS — R51 Headache: Secondary | ICD-10-CM | POA: Insufficient documentation

## 2011-02-27 MED ORDER — KETOROLAC TROMETHAMINE 30 MG/ML IJ SOLN
30.0000 mg | Freq: Once | INTRAMUSCULAR | Status: AC
  Administered 2011-02-27: 30 mg via INTRAVENOUS
  Filled 2011-02-27: qty 1

## 2011-02-27 MED ORDER — DROPERIDOL 2.5 MG/ML IJ SOLN
2.5000 mg | Freq: Once | INTRAMUSCULAR | Status: AC
  Administered 2011-02-27: 2.5 mg via INTRAVENOUS
  Filled 2011-02-27: qty 2

## 2011-02-27 NOTE — ED Notes (Addendum)
Pt refused medication administration after the medication was scanned. Pt states She " has just had bad experiences with medications". Pt wants glucose checked, something to eat and to go home..  Dr Radford Pax notified.  Both Dr Radford Pax and I both reassured pt of medication safety. Pt verbalized her need for "stronger meds cause that is what works"

## 2011-02-27 NOTE — ED Notes (Signed)
Pt arrived via [facility] secondary to having a sudden onset headache x 45 min. Pt states "this headache is different than any other".  C/O occipital headache with some nausea. Denies injury/trauma or any other complaints.

## 2011-02-27 NOTE — ED Provider Notes (Signed)
History     CSN: [id number] Arrival date & time: 02/27/2011 11:43 AM   Chief Complaint  Patient presents with  . Headache     (Include location/radiation/quality/duration/timing/severity/associated sxs/prior treatment) Patient is a 25 y.o. female presenting with headaches. The history is provided by the patient.  Headache  This is a new problem. The current episode started 1 to 2 hours ago. The problem occurs constantly. The headache is associated with nothing. The pain is located in the occipital region. The quality of the pain is described as throbbing. The pain is at a severity of 10/10. The pain is severe. The pain does not radiate. Pertinent negatives include no anorexia, no nausea and no vomiting. She has tried nothing for the symptoms.     Past Medical History  Diagnosis Date  . Dysautonomia   . SVT (supraventricular tachycardia) 2004    S/P slow path modification     Past Surgical History  Procedure Date  . Cesarean section   . Ventricular ablation surgery     For supraventricular tachycardia    Family History  Problem Relation Age of Onset  . Hypertension Other   . Diabetes Other   . Depression Other     History  Substance Use Topics  . Smoking status: Never Smoker   . Smokeless tobacco: Not on file  . Alcohol Use: No     Denies    OB History    Grav Para Term Preterm Abortions TAB SAB Ect Mult Living   4 4 4              Review of Systems  Gastrointestinal: Negative for nausea, vomiting and anorexia.  Neurological: Positive for headaches.  All other systems reviewed and are negative.    Allergies  Vancomycin  Home Medications   Current Outpatient Rx  Name Route Sig Dispense Refill  . VITAMIN D3 1000 UNITS PO CAPS Oral Take 1 tablet by mouth daily.      Marland Kitchen DIAZEPAM 10 MG PO TABS Oral Take 5 mg by mouth daily.      Marland Kitchen FLUDROCORTISONE ACETATE 0.1 MG PO TABS Oral Take 1 tablet (0.1 mg total) by mouth daily. 1/2 tablet at 8 am and 1/2 tablet at  2 pm. 30 tablet 6  . GABAPENTIN 100 MG PO CAPS Oral Take 100 mg by mouth at bedtime.      Marland Kitchen METOPROLOL SUCCINATE 25 MG PO TB24 Oral Take 1 tablet (25 mg total) by mouth 2 (two) times daily. 30 tablet 6  . SERTRALINE HCL 50 MG PO TABS Oral Take 50 mg by mouth daily.        Physical Exam    BP 108/70  Pulse 99  Temp(Src) 99 F (37.2 C) (Oral)  SpO2 100%  LMP 01/27/2011  Physical Exam HEENT: Pupils are reactive eyegrounds normal extraocular movements intact normal  Oropharynx: normal  Chest: Lungs equal bilaterally. No wheezes rhonchi bilaterally.  Heart: No rubs murmurs gallops.  Abdomen: Soft nontender active bowel sounds no rebound guarding tenderness  Neuro: Cranial nerves II through XII intact and normal. Grip strengths equal bilaterally. Gait normal. Reflexes normal. Neck supple without meningeal signs.  Skin: No rashes color normal  Spine: Normal.  Chest wall: Without tenderness to palpation.  Lymph system: No adenopathy. ED Course  Procedures    Results for orders placed in visit on 12/18/10  TSH      Component Value Range   TSH 1.07  0.35 - 5.50 (uIU/mL)  VITAMIN D 25  HYDROXY      Component Value Range   Vit D, 25-Hydroxy 43  30 - 89 (ng/mL)   No results found.     MDM      Patient refused her droperidol and Toradol injections. She wants to be fed and then she wants to go home.  Nelia Shi, MD 02/27/11 [phone]

## 2011-03-08 LAB — DIFFERENTIAL
Basophils Absolute: 0
Basophils Relative: 0
Basophils Relative: 0
Eosinophils Absolute: 0
Eosinophils Absolute: 0
Eosinophils Relative: 0
Eosinophils Relative: 1
Lymphs Abs: 1.8
Monocytes Absolute: 0.4
Monocytes Relative: 4
Monocytes Relative: 5
Neutrophils Relative %: 67

## 2011-03-08 LAB — BASIC METABOLIC PANEL
CO2: 26
Calcium: 9
Chloride: 103
Glucose, Bld: 89
Potassium: 3.4 — ABNORMAL LOW
Sodium: 136

## 2011-03-08 LAB — CBC
HCT: 38.4
HCT: 35.6 — ABNORMAL LOW
Hemoglobin: 13.4
MCHC: 35
MCHC: 35.4
MCV: 88.1
MCV: 87.8
Platelets: 244
RDW: 12.2

## 2011-03-08 LAB — URINALYSIS, ROUTINE W REFLEX MICROSCOPIC
Bilirubin Urine: NEGATIVE
Glucose, UA: NEGATIVE
Hgb urine dipstick: NEGATIVE
Protein, ur: NEGATIVE
Urobilinogen, UA: 0.2

## 2011-03-08 LAB — POCT I-STAT, CHEM 8
Creatinine, Ser: 0.7
Glucose, Bld: 99
HCT: 39
Hemoglobin: 13.3
Potassium: 3.4 — ABNORMAL LOW
TCO2: 24

## 2011-03-20 LAB — URINALYSIS, ROUTINE W REFLEX MICROSCOPIC
Bilirubin Urine: NEGATIVE
Glucose, UA: NEGATIVE
Hgb urine dipstick: NEGATIVE
Ketones, ur: NEGATIVE
Protein, ur: NEGATIVE
Urobilinogen, UA: 0.2

## 2011-09-03 ENCOUNTER — Other Ambulatory Visit: Payer: Self-pay

## 2011-09-03 ENCOUNTER — Emergency Department (HOSPITAL_COMMUNITY)
Admission: EM | Admit: 2011-09-03 | Discharge: 2011-09-03 | Disposition: A | Discharge status: Home / Self Care | Payer: BC Managed Care – PPO | Attending: Emergency Medicine | Admitting: Emergency Medicine

## 2011-09-03 ENCOUNTER — Encounter (HOSPITAL_COMMUNITY): Payer: Self-pay | Admitting: *Deleted

## 2011-09-03 DIAGNOSIS — R110 Nausea: Secondary | ICD-10-CM | POA: Insufficient documentation

## 2011-09-03 DIAGNOSIS — J020 Streptococcal pharyngitis: Secondary | ICD-10-CM | POA: Insufficient documentation

## 2011-09-03 DIAGNOSIS — R109 Unspecified abdominal pain: Secondary | ICD-10-CM | POA: Insufficient documentation

## 2011-09-03 DIAGNOSIS — J0300 Acute streptococcal tonsillitis, unspecified: Secondary | ICD-10-CM

## 2011-09-03 LAB — RAPID STREP SCREEN (MED CTR MEBANE ONLY): Streptococcus, Group A Screen (Direct): POSITIVE — AB

## 2011-09-03 MED ORDER — IBUPROFEN 400 MG PO TABS
600.0000 mg | ORAL_TABLET | Freq: Once | ORAL | Status: AC
  Administered 2011-09-03: 600 mg via ORAL
  Filled 2011-09-03: qty 2

## 2011-09-03 MED ORDER — DEXAMETHASONE SODIUM PHOSPHATE 4 MG/ML IJ SOLN
8.0000 mg | Freq: Once | INTRAMUSCULAR | Status: AC
  Administered 2011-09-03: 8 mg via INTRAMUSCULAR
  Filled 2011-09-03: qty 2

## 2011-09-03 MED ORDER — TRAMADOL HCL 50 MG PO TABS: 50.0000 mg | ORAL_TABLET | Freq: Four times a day (QID) | ORAL | Status: AC | PRN

## 2011-09-03 MED ORDER — PENICILLIN V POTASSIUM 500 MG PO TABS: 500.0000 mg | ORAL_TABLET | Freq: Two times a day (BID) | ORAL | Status: AC

## 2011-09-03 MED ORDER — IBUPROFEN 600 MG PO TABS: 600.0000 mg | ORAL_TABLET | Freq: Four times a day (QID) | ORAL | Status: AC | PRN

## 2011-09-03 MED ORDER — TRAMADOL HCL 50 MG PO TABS
50.0000 mg | ORAL_TABLET | Freq: Once | ORAL | Status: AC
  Administered 2011-09-03: 50 mg via ORAL
  Filled 2011-09-03: qty 1

## 2011-09-03 NOTE — ED Notes (Signed)
When I went to place pt in lobby, pt states that she remembered that her heart would be racing at times, EKG done at triage.

## 2011-09-03 NOTE — ED Notes (Signed)
Pt c/o sore throat, chills, headache, aching all over. Pt alert and oriented x 3. Skin warm and dry. Color pink. Breath sounds clear and equal bilaterally.

## 2011-09-03 NOTE — Discharge Instructions (Signed)

## 2011-09-03 NOTE — ED Provider Notes (Signed)
History     CSN: [id number]  Arrival date & time 09/03/11  1419   First MD Initiated Contact with Patient 09/03/11 1533      Chief Complaint  Patient presents with  . Sore Throat  . Nausea  . Abdominal Pain  . Generalized Body Aches  . Chills  . Tachycardia    (Consider location/radiation/quality/duration/timing/severity/associated sxs/prior treatment) HPI Pt started having sore throat, cervical lymphadenopathy, fever, palpitation, nausea, HA and generalized body aches yesterday. States she noticed her tonsils were red and enlarged.  Past Medical History  Diagnosis Date  . Dysautonomia   . SVT (supraventricular tachycardia) 2004    S/P slow path modification    Past Surgical History  Procedure Date  . Cesarean section   . Ventricular ablation surgery     For supraventricular tachycardia    Family History  Problem Relation Age of Onset  . Hypertension Other   . Diabetes Other   . Depression Other     History  Substance Use Topics  . Smoking status: Never Smoker   . Smokeless tobacco: Not on file  . Alcohol Use: No     Denies    OB History    Grav Para Term Preterm Abortions TAB SAB Ect Mult Living   4 4 4              Review of Systems  Constitutional: Negative for fever, chills and fatigue.  HENT: Negative for sore throat.   Respiratory: Negative for cough.   Cardiovascular: Positive for palpitations. Negative for chest pain.  Gastrointestinal: Negative for nausea.  Musculoskeletal: Negative for myalgias.  Neurological: Positive for headaches. Negative for weakness and numbness.    Allergies  Vancomycin  Home Medications   Current Outpatient Rx  Name Route Sig Dispense Refill  . VITAMIN D3 1000 UNITS PO CAPS Oral Take 1 tablet by mouth daily.      Marland Kitchen METOPROLOL SUCCINATE ER 25 MG PO TB24 Oral Take 1 tablet (25 mg total) by mouth 2 (two) times daily. 30 tablet 6  . SERTRALINE HCL 50 MG PO TABS Oral Take 50 mg by mouth daily.      . IBUPROFEN  600 MG PO TABS Oral Take 1 tablet (600 mg total) by mouth every 6 (six) hours as needed for pain. 30 tablet 0  . PENICILLIN V POTASSIUM 500 MG PO TABS Oral Take 1 tablet (500 mg total) by mouth 2 (two) times daily. 20 tablet 0  . TRAMADOL HCL 50 MG PO TABS Oral Take 1 tablet (50 mg total) by mouth every 6 (six) hours as needed for pain. 15 tablet 0    BP 108/68  Pulse 113  Temp(Src) 99.8 F (37.7 C) (Oral)  Resp 20  Ht 5\' 1"  (1.549 m)  Wt 160 lb (72.576 kg)  BMI 30.23 kg/m2  SpO2 97%  LMP 08/05/2011  Physical Exam  Nursing note and vitals reviewed. Constitutional: She is oriented to person, place, and time. She appears well-developed and well-nourished. No distress.  HENT:  Head: Normocephalic and atraumatic.  Mouth/Throat: Oropharyngeal exudate (Bl tonsilar enlargement with erythema and exudate. No evidence of PTA. ) present.  Eyes: EOM are normal. Pupils are equal, round, and reactive to light.  Neck: Normal range of motion. Neck supple.  Cardiovascular: Regular rhythm.        Mild tachycardia  Pulmonary/Chest: Effort normal and breath sounds normal. No respiratory distress. She has no wheezes. She has no rales.  Abdominal: Soft. Bowel sounds are normal.  There is no tenderness. There is no rebound and no guarding.  Musculoskeletal: Normal range of motion. She exhibits no edema and no tenderness.  Neurological: She is alert and oriented to person, place, and time.  Skin: Skin is warm and dry. No rash noted. No erythema.  Psychiatric: She has a normal mood and affect. Her behavior is normal.    ED Course  Procedures (including critical care time)  Labs Reviewed  RAPID STREP SCREEN - Abnormal; Notable for the following:    Streptococcus, Group A Screen (Direct) POSITIVE (*)    All other components within normal limits   No results found.   1. Strep tonsillitis      Date: 09/03/2011  Rate: 103  Rhythm: sinus tachycardia  QRS Axis: normal  Intervals: normal  ST/T  Wave abnormalities: nonspecific ST changes  Conduction Disutrbances:none  Narrative Interpretation:   Old EKG Reviewed: none available    MDM  Strep positive. Will treat symptomatically and D/C home with abx        Loren Racer, MD 09/03/11 [phone]

## 2011-09-03 NOTE — ED Notes (Addendum)
Pt c/o sore throat, abd pain, chills, body aches. Nausea,headache that started last night.

## 2012-01-24 ENCOUNTER — Encounter: Payer: Self-pay | Admitting: Internal Medicine

## 2012-01-24 ENCOUNTER — Other Ambulatory Visit: Payer: Self-pay | Admitting: Internal Medicine

## 2012-01-24 ENCOUNTER — Ambulatory Visit (INDEPENDENT_AMBULATORY_CARE_PROVIDER_SITE_OTHER): Payer: BC Managed Care – PPO | Admitting: Internal Medicine

## 2012-01-24 VITALS — BP 125/88 | HR 96 | Ht 61.0 in | Wt 174.1 lb

## 2012-01-24 DIAGNOSIS — G909 Disorder of the autonomic nervous system, unspecified: Secondary | ICD-10-CM

## 2012-01-24 LAB — CBC WITH DIFFERENTIAL/PLATELET
Basophils Absolute: 0 10*3/uL (ref 0.0–0.1)
Lymphocytes Relative: 31.5 % (ref 12.0–46.0)
Lymphs Abs: 2.2 10*3/uL (ref 0.7–4.0)
Monocytes Relative: 5.6 % (ref 3.0–12.0)
Platelets: 275 10*3/uL (ref 150.0–400.0)
RDW: 13.2 % (ref 11.5–14.6)

## 2012-01-24 LAB — BASIC METABOLIC PANEL
BUN: 13 mg/dL (ref 6–23)
CO2: 27 mEq/L (ref 19–32)
Calcium: 9.2 mg/dL (ref 8.4–10.5)
GFR: 122.08 mL/min (ref >60.00)
Glucose, Bld: 95 mg/dL (ref 70–99)
Sodium: 139 mEq/L (ref 135–145)

## 2012-01-24 MED ORDER — METOPROLOL SUCCINATE ER 25 MG PO TB24: 25.0000 mg | ORAL_TABLET | Freq: Three times a day (TID) | ORAL | Status: DC

## 2012-01-24 NOTE — Progress Notes (Signed)
HPI  Allergies  Allergen Reactions  . Vancomycin     Current Outpatient Prescriptions  Medication Sig Dispense Refill  . Cholecalciferol (VITAMIN D3) 1000 UNITS CAPS Take 1 tablet by mouth daily.        . metoprolol succinate (TOPROL-XL) 25 MG 24 hr tablet Take 1 tablet (25 mg total) by mouth 2 (two) times daily.  30 tablet  6  . DISCONTD: gabapentin (NEURONTIN) 100 MG capsule Take 100 mg by mouth at bedtime.          Past Medical History  Diagnosis Date  . Dysautonomia   . SVT (supraventricular tachycardia) 2004    S/P slow path modification    Past Surgical History  Procedure Date  . Cesarean section   . Ventricular ablation surgery     For supraventricular tachycardia    Family History  Problem Relation Age of Onset  . Hypertension Other   . Diabetes Other   . Depression Other     History   Social History  . Marital Status: Married    Spouse Name: N/A    Number of Children: N/A  . Years of Education: N/A   Occupational History  . Not on file.   Social History Main Topics  . Smoking status: Never Smoker   . Smokeless tobacco: Not on file  . Alcohol Use: No     Denies  . Drug Use: No     Denies  . Sexually Active: Yes    Birth Control/ Protection: Surgical   Other Topics Concern  . Not on file   Social History Narrative  . No narrative on file    Review of Systems:  All systems reviewed.  They are negative to the above problem except as previously stated.  Vital Signs: BP 125/88  Pulse 96  Ht 5\' 1"  (1.549 m)  Wt 174 lb 1.9 oz (78.98 kg)  BMI 32.90 kg/m2  Physical Exam  HEENT:  Normocephalic, atraumatic. EOMI, PERRLA.  Neck: JVP is normal.  No bruits.  Lungs: clear to auscultation. No rales no wheezes.  Heart: Regular rate and rhythm. Normal S1, S2. No S3.   No significant murmurs. PMI not displaced.  Abdomen:  Supple, nontender. Normal bowel sounds. No masses. No hepatomegaly.  Extremities:   Good distal pulses throughout. No lower  extremity edema.  Musculoskeletal :moving all extremities.  Neuro:   alert and oriented x3.  CN II-XII grossly intact.  EKG:  SR  80 bpm Assessment and Plan:  1.  Autonomic dysfunction.  Patient remains symptomatic.  HR went from 78 to 104 then 96 with standing.  BP went up. I would increase metoprolol to tid.   I encouraged her to increase fluid and sal intake. I don't think she is doing this.  Encourage her  I would check 24 hr U Na. Elevate head of bed. WIll check labs today.  Follow up with resullts

## 2012-01-24 NOTE — Patient Instructions (Addendum)
Increase Metoprolol to 3 times per day.  Lab work today Will call you with lab results and follow up appointment.

## 2012-04-15 ENCOUNTER — Emergency Department (HOSPITAL_COMMUNITY)
Admission: EM | Admit: 2012-04-15 | Discharge: 2012-04-15 | Disposition: A | Discharge status: Home / Self Care | Payer: BC Managed Care – PPO | Attending: Emergency Medicine | Admitting: Emergency Medicine

## 2012-04-15 ENCOUNTER — Encounter (HOSPITAL_COMMUNITY): Payer: Self-pay | Admitting: Emergency Medicine

## 2012-04-15 ENCOUNTER — Emergency Department (HOSPITAL_COMMUNITY): Payer: BC Managed Care – PPO

## 2012-04-15 DIAGNOSIS — R109 Unspecified abdominal pain: Secondary | ICD-10-CM

## 2012-04-15 DIAGNOSIS — R197 Diarrhea, unspecified: Secondary | ICD-10-CM | POA: Insufficient documentation

## 2012-04-15 DIAGNOSIS — K219 Gastro-esophageal reflux disease without esophagitis: Secondary | ICD-10-CM

## 2012-04-15 DIAGNOSIS — R110 Nausea: Secondary | ICD-10-CM | POA: Insufficient documentation

## 2012-04-15 DIAGNOSIS — I498 Other specified cardiac arrhythmias: Secondary | ICD-10-CM | POA: Insufficient documentation

## 2012-04-15 DIAGNOSIS — G909 Disorder of the autonomic nervous system, unspecified: Secondary | ICD-10-CM | POA: Insufficient documentation

## 2012-04-15 LAB — CBC WITH DIFFERENTIAL/PLATELET
Basophils Relative: 0 % (ref 0–1)
Eosinophils Relative: 0 % (ref 0–5)
HCT: 41.4 % (ref 36.0–46.0)
Hemoglobin: 13.7 g/dL (ref 12.0–15.0)
Lymphocytes Relative: 10 % — ABNORMAL LOW (ref 12–46)
MCHC: 33.1 g/dL (ref 30.0–36.0)
MCV: 87.7 fL (ref 78.0–100.0)
Monocytes Absolute: 0.4 10*3/uL (ref 0.1–1.0)
Monocytes Relative: 4 % (ref 3–12)
Neutro Abs: 8.2 10*3/uL — ABNORMAL HIGH (ref 1.7–7.7)

## 2012-04-15 LAB — URINALYSIS, ROUTINE W REFLEX MICROSCOPIC
Leukocytes, UA: NEGATIVE
Nitrite: NEGATIVE
Protein, ur: NEGATIVE mg/dL
Specific Gravity, Urine: 1.014 (ref 1.005–1.030)
Urobilinogen, UA: 0.2 mg/dL (ref 0.0–1.0)

## 2012-04-15 LAB — COMPREHENSIVE METABOLIC PANEL
ALT: 12 U/L (ref 0–35)
Albumin: 4 g/dL (ref 3.5–5.2)
Calcium: 8.6 mg/dL (ref 8.4–10.5)
GFR calc Af Amer: >90 mL/min (ref >90)
Glucose, Bld: 97 mg/dL (ref 70–99)
Potassium: 3.4 mEq/L — ABNORMAL LOW (ref 3.5–5.1)
Sodium: 138 mEq/L (ref 135–145)
Total Protein: 7.6 g/dL (ref 6.0–8.3)

## 2012-04-15 LAB — POCT PREGNANCY, URINE: Preg Test, Ur: NEGATIVE

## 2012-04-15 LAB — LIPASE, BLOOD: Lipase: 25 U/L (ref 11–59)

## 2012-04-15 MED ORDER — GI COCKTAIL ~~LOC~~
30.0000 mL | Freq: Once | ORAL | Status: AC
  Administered 2012-04-15: 30 mL via ORAL
  Filled 2012-04-15: qty 30

## 2012-04-15 MED ORDER — OMEPRAZOLE 20 MG PO CPDR: 40.0000 mg | DELAYED_RELEASE_CAPSULE | Freq: Every day | ORAL | Status: DC

## 2012-04-15 MED ORDER — MORPHINE SULFATE 4 MG/ML IJ SOLN
4.0000 mg | Freq: Once | INTRAMUSCULAR | Status: AC
  Administered 2012-04-15: 4 mg via INTRAVENOUS
  Filled 2012-04-15: qty 1

## 2012-04-15 MED ORDER — SODIUM CHLORIDE 0.9 % IV SOLN
1000.0000 mL | INTRAVENOUS | Status: DC
  Administered 2012-04-15: 1000 mL via INTRAVENOUS

## 2012-04-15 MED ORDER — HYDROCODONE-ACETAMINOPHEN 5-325 MG PO TABS: 2.0000 | ORAL_TABLET | ORAL | Status: DC | PRN

## 2012-04-15 MED ORDER — ONDANSETRON HCL 4 MG/2ML IJ SOLN
4.0000 mg | Freq: Once | INTRAMUSCULAR | Status: AC
  Administered 2012-04-15: 4 mg via INTRAVENOUS
  Filled 2012-04-15: qty 2

## 2012-04-15 NOTE — ED Provider Notes (Signed)
Medical screening examination/treatment/procedure(s) were performed by non-physician practitioner and as supervising physician I was immediately available for consultation/collaboration.   Giah Fickett R Nickcole Bralley, MD 04/15/12 2348 

## 2012-04-15 NOTE — ED Notes (Signed)
PER EMS- pt c/o abd x2 days, reports 10/10.  Pt scheduled an appt with the DR and was seen today, results were WNL.  On her way home pt pulled over at gas station and called EMS.  Pt's pulse is elevated, however pt has hx of an ablation and baseline HR is usually 100-120.  Pt reports not taking metoprolol. Today. Pt is alert and oriented.

## 2012-04-15 NOTE — ED Notes (Signed)
Bed:WA22<BR> Expected date:<BR> Expected time:<BR> Means of arrival:<BR> Comments:<BR> ems °

## 2012-04-15 NOTE — ED Provider Notes (Signed)
History     CSN: [id number]  Arrival date & time 04/15/12  1303   First MD Initiated Contact with Patient 04/15/12 1439      Chief Complaint  Patient presents with  . Abdominal Pain    (Consider location/radiation/quality/duration/timing/severity/associated sxs/prior treatment) HPI Comments: This is a 26 year old female, who presents to the emergency department with chief complaint of upper abdominal pain since yesterday. She states that the pain is associated with nausea, and diarrhea. She states that she has tried taking TUMS, with no relief. She states that the pain does not radiate. She is in 9/10 pain. She states that it hurts to eat or drink. She has had 3 C-sections, but no other abdominal surgeries. She endorses fever, headache, nausea, and diarrhea. She denies chest pain, shortness of breath, constipation, dysuria, vaginal discharge, and numbness or tingling of the extremities.  The history is provided by the patient. No language interpreter was used.    Past Medical History  Diagnosis Date  . Dysautonomia   . SVT (supraventricular tachycardia) 2004    S/P slow path modification    Past Surgical History  Procedure Date  . Cesarean section   . Ventricular ablation surgery     For supraventricular tachycardia    Family History  Problem Relation Age of Onset  . Hypertension Other   . Diabetes Other   . Depression Other     History  Substance Use Topics  . Smoking status: Never Smoker   . Smokeless tobacco: Not on file  . Alcohol Use: No     Comment: Denies    OB History    Grav Para Term Preterm Abortions TAB SAB Ect Mult Living   4 4 4              Review of Systems  Gastrointestinal: Positive for nausea and abdominal pain. Negative for abdominal distention.  All other systems reviewed and are negative.    Allergies  Vancomycin  Home Medications   Current Outpatient Rx  Name  Route  Sig  Dispense  Refill  . BIOTIN PO   Oral   Take 1 capsule  by mouth daily.         Marland Kitchen VITAMIN D3 1000 UNITS PO CAPS   Oral   Take 1 capsule by mouth daily.          Marland Kitchen METOPROLOL SUCCINATE ER 25 MG PO TB24   Oral   Take 12.5 mg by mouth 2 (two) times daily.           BP 122/76  Pulse 114  Temp 100 F (37.8 C)  Resp 18  SpO2 98%  Physical Exam  Nursing note and vitals reviewed. Constitutional: She is oriented to person, place, and time. She appears well-developed and well-nourished.  HENT:  Head: Normocephalic and atraumatic.  Eyes: Conjunctivae normal and EOM are normal. Pupils are equal, round, and reactive to light.  Neck: Normal range of motion. Neck supple.  Cardiovascular: Normal rate and regular rhythm.  Exam reveals no gallop and no friction rub.   No murmur heard. Pulmonary/Chest: Effort normal and breath sounds normal. No respiratory distress. She has no wheezes. She has no rales. She exhibits no tenderness.  Abdominal: Soft. Bowel sounds are normal. She exhibits no distension and no mass. There is tenderness. There is no rebound and no guarding.       Patient is tender to palpation in right upper and left upper quadrants. Most of her tenderness is in the  right upper quadrant, no masses are palpated. No rebound tenderness, McBurney point tenderness, or peritoneal signs.  Musculoskeletal: Normal range of motion. She exhibits no edema and no tenderness.  Neurological: She is alert and oriented to person, place, and time.  Skin: Skin is warm and dry.  Psychiatric: She has a normal mood and affect. Her behavior is normal. Judgment and thought content normal.    ED Course  Procedures (including critical care time)   Labs Reviewed  POCT PREGNANCY, URINE  LIPASE, BLOOD  URINALYSIS, ROUTINE W REFLEX MICROSCOPIC  COMPREHENSIVE METABOLIC PANEL  CBC WITH DIFFERENTIAL   Results for orders placed during the hospital encounter of 04/15/12  LIPASE, BLOOD      Component Value Range   Lipase 25  11 - 59 U/L  URINALYSIS,  ROUTINE W REFLEX MICROSCOPIC      Component Value Range   Color, Urine YELLOW  YELLOW   APPearance CLEAR  CLEAR   Specific Gravity, Urine 1.014  1.005 - 1.030   pH 7.0  5.0 - 8.0   Glucose, UA NEGATIVE  NEGATIVE mg/dL   Hgb urine dipstick NEGATIVE  NEGATIVE   Bilirubin Urine NEGATIVE  NEGATIVE   Ketones, ur 40 (*) NEGATIVE mg/dL   Protein, ur NEGATIVE  NEGATIVE mg/dL   Urobilinogen, UA 0.2  0.0 - 1.0 mg/dL   Nitrite NEGATIVE  NEGATIVE   Leukocytes, UA NEGATIVE  NEGATIVE  COMPREHENSIVE METABOLIC PANEL      Component Value Range   Sodium 138  135 - 145 mEq/L   Potassium 3.4 (*) 3.5 - 5.1 mEq/L   Chloride 102  96 - 112 mEq/L   CO2 25  19 - 32 mEq/L   Glucose, Bld 97  70 - 99 mg/dL   BUN 10  6 - 23 mg/dL   Creatinine, Ser 4.09  0.50 - 1.10 mg/dL   Calcium 8.6  8.4 - 81.1 mg/dL   Total Protein 7.6  6.0 - 8.3 g/dL   Albumin 4.0  3.5 - 5.2 g/dL   AST 14  0 - 37 U/L   ALT 12  0 - 35 U/L   Alkaline Phosphatase 92  39 - 117 U/L   Total Bilirubin 0.7  0.3 - 1.2 mg/dL   GFR calc non Af Amer >90  >90 mL/min   GFR calc Af Amer >90  >90 mL/min  CBC WITH DIFFERENTIAL      Component Value Range   WBC 9.5  4.0 - 10.5 K/uL   RBC 4.72  3.87 - 5.11 MIL/uL   Hemoglobin 13.7  12.0 - 15.0 g/dL   HCT 91.4  78.2 - 95.6 %   MCV 87.7  78.0 - 100.0 fL   MCH 29.0  26.0 - 34.0 pg   MCHC 33.1  30.0 - 36.0 g/dL   RDW 21.3  08.6 - 57.8 %   Platelets 280  150 - 400 K/uL   Neutrophils Relative 86 (*) 43 - 77 %   Neutro Abs 8.2 (*) 1.7 - 7.7 K/uL   Lymphocytes Relative 10 (*) 12 - 46 %   Lymphs Abs 0.9  0.7 - 4.0 K/uL   Monocytes Relative 4  3 - 12 %   Monocytes Absolute 0.4  0.1 - 1.0 K/uL   Eosinophils Relative 0  0 - 5 %   Eosinophils Absolute 0.0  0.0 - 0.7 K/uL   Basophils Relative 0  0 - 1 %   Basophils Absolute 0.0  0.0 - 0.1 K/uL  POCT PREGNANCY, URINE      Component Value Range   Preg Test, Ur NEGATIVE  NEGATIVE   US Abdomen Complete  04/15/2012  *RADIOLOGY REPORT*  Clinical Data:   Abdominal pain  COMPLETE ABDOMINAL ULTRASOUND  Comparison:  None.  Findings:  Gallbladder:  No gallstones, gallbladder wall thickening, or pericholecystic fluid.  Negative sonographic Murphy's sign.  Common bile duct:  Measures 4.5 mm.  Liver:  No focal lesion identified.  Within normal limits in parenchymal echogenicity.  IVC:  Appears normal.  Pancreas:  Incompletely visualized but grossly unremarkable.  Spleen:  Measures 8.6 cm.  Right Kidney:  Measures 10.3 cm.  No mass or hydronephrosis.  Left Kidney:  Measures 10.2 cm.  No mass or hydronephrosis.  Abdominal aorta:  No aneurysm identified.  IMPRESSION: Negative abdominal ultrasound.   Original Report Authenticated By: Charline Bills, M.D.        1. Abdominal  pain, other specified site   2. GERD (gastroesophageal reflux disease)       MDM   26 year old female with reflux.  Patient's pain has been controlled in the ED with morphine, and states that it has improved throughout the course of the day. I also gave her a GI cocktail, which she's said helped significantly.   This patient's workup has been negative today. I see no signs of pancreatitis, appendicitis, small bowel obstruction, peritoneal signs, or any other emergent process. I am going to discharge the patient to home with some pain medicine and omeprazole and will have the patient followup with her primary care provider. I have discussed this patient with Dr. Juleen China and Lynelle Doctor. The patient is stable and ready for discharge.       Roxy Horseman, PA-C 04/15/12 1845

## 2012-08-05 ENCOUNTER — Emergency Department (HOSPITAL_COMMUNITY)
Admission: EM | Admit: 2012-08-05 | Discharge: 2012-08-05 | Disposition: A | Discharge status: Home / Self Care | Payer: Worker's Compensation | Attending: Emergency Medicine | Admitting: Emergency Medicine

## 2012-08-05 ENCOUNTER — Encounter (HOSPITAL_COMMUNITY): Payer: Self-pay | Admitting: *Deleted

## 2012-08-05 DIAGNOSIS — R42 Dizziness and giddiness: Secondary | ICD-10-CM | POA: Insufficient documentation

## 2012-08-05 DIAGNOSIS — Z8669 Personal history of other diseases of the nervous system and sense organs: Secondary | ICD-10-CM | POA: Insufficient documentation

## 2012-08-05 DIAGNOSIS — Z8679 Personal history of other diseases of the circulatory system: Secondary | ICD-10-CM | POA: Insufficient documentation

## 2012-08-05 DIAGNOSIS — R51 Headache: Secondary | ICD-10-CM | POA: Insufficient documentation

## 2012-08-05 DIAGNOSIS — Z79899 Other long term (current) drug therapy: Secondary | ICD-10-CM | POA: Insufficient documentation

## 2012-08-05 LAB — BLOOD GAS, ARTERIAL
Bicarbonate: 25.6 mEq/L — ABNORMAL HIGH (ref 20.0–24.0)
pH, Arterial: 7.417 (ref 7.350–7.450)
pO2, Arterial: 97.8 mmHg (ref 80.0–100.0)

## 2012-08-05 NOTE — ED Provider Notes (Signed)
History     CSN: [id number]  Arrival date & time 08/05/12  1655   First MD Initiated Contact with Patient 08/05/12 1752      Chief Complaint  Patient presents with  . Headache    (Consider location/radiation/quality/duration/timing/severity/associated sxs/prior treatment) HPI...Marland Kitchenheadache and lightheaded feeling at work today. Patient perceived that she smelled gas.  The fire department was called. Her place of employment was examined. No loss of consciousness, neurological deficits, chest pain, dyspnea, fever, chills, dysuria.  Symptoms have abated. Severity was mild to moderate.  Past Medical History  Diagnosis Date  . Dysautonomia   . SVT (supraventricular tachycardia) 2004    S/P slow path modification    Past Surgical History  Procedure Laterality Date  . Cesarean section    . Ventricular ablation surgery      For supraventricular tachycardia    Family History  Problem Relation Age of Onset  . Hypertension Other   . Diabetes Other   . Depression Other     History  Substance Use Topics  . Smoking status: Never Smoker   . Smokeless tobacco: Not on file  . Alcohol Use: No     Comment: Denies    OB History   Grav Para Term Preterm Abortions TAB SAB Ect Mult Living   4 4 4              Review of Systems  All other systems reviewed and are negative.    Allergies  Vancomycin  Home Medications   Current Outpatient Rx  Name  Route  Sig  Dispense  Refill  . Cholecalciferol (VITAMIN D3) 1000 UNITS CAPS   Oral   Take 1 capsule by mouth daily.          . metoprolol succinate (TOPROL-XL) 25 MG 24 hr tablet   Oral   Take 12.5 mg by mouth 2 (two) times daily.         . Multiple Vitamin (MULTIVITAMIN WITH MINERALS) TABS   Oral   Take 1 tablet by mouth daily.           BP 129/78  Pulse 100  Temp(Src) 98.7 F (37.1 C) (Oral)  Resp 18  Ht 5\' 1"  (1.549 m)  Wt 186 lb (84.369 kg)  BMI 35.16 kg/m2  SpO2 99%  LMP 07/29/2012  Physical Exam   Nursing note and vitals reviewed. Constitutional: She is oriented to person, place, and time. She appears well-developed and well-nourished.  HENT:  Head: Normocephalic and atraumatic.  Eyes: Conjunctivae and EOM are normal. Pupils are equal, round, and reactive to light.  Neck: Normal range of motion. Neck supple.  Cardiovascular: Normal rate, regular rhythm and normal heart sounds.   Pulmonary/Chest: Effort normal and breath sounds normal.  Abdominal: Soft. Bowel sounds are normal.  Musculoskeletal: Normal range of motion.  Neurological: She is alert and oriented to person, place, and time.  Skin: Skin is warm and dry.  Psychiatric: She has a normal mood and affect.    ED Course  Procedures (including critical care time)  Labs Reviewed  BLOOD GAS, ARTERIAL - Abnormal; Notable for the following:    Bicarbonate 25.6 (*)    All other components within normal limits   No results found.   1. Headache       MDM  Carboxyhemoglobin level was 1.2% which is normal. PH, PCO2, PO2 all normal. Patient has normal physical exam. Discharge        Donnetta Hutching, MD 08/05/12 2025

## 2012-08-05 NOTE — ED Notes (Signed)
Reports was at work and begin to feel lightheaded with headache; fire department came and tested and found carbon monoxide (low levels per pt) and "gas".  Reports feeling better now, and symptoms have been improving since she left the building earlier today.

## 2012-08-05 NOTE — Discharge Instructions (Signed)
Carbon monoxide level in your blood was normal. Still recommend having your place of employment checked for carbon monoxide poisoning

## 2012-08-05 NOTE — ED Notes (Signed)
Pt reporting improvement in headache.  No distress at this time.  Denies needs at present.

## 2012-08-07 ENCOUNTER — Encounter: Payer: Self-pay | Admitting: Internal Medicine

## 2012-08-07 ENCOUNTER — Ambulatory Visit (INDEPENDENT_AMBULATORY_CARE_PROVIDER_SITE_OTHER): Payer: BC Managed Care – PPO | Admitting: Internal Medicine

## 2012-08-07 VITALS — BP 100/72 | HR 87 | Ht 61.0 in | Wt 190.0 lb

## 2012-08-07 DIAGNOSIS — E785 Hyperlipidemia, unspecified: Secondary | ICD-10-CM

## 2012-08-07 DIAGNOSIS — E663 Overweight: Secondary | ICD-10-CM

## 2012-08-07 NOTE — Progress Notes (Signed)
HPI Patinet is a 27 yo with a history of dysautonomia  Presents for routine f/u Says she is doing better overall.  Occasionally dizzy   Breathing is OK Drinking fluids  Use of metoprolol can be erratic. Biggest complaint is wt gain and thnning of hair  Allergies  Allergen Reactions  . Vancomycin Hives and Itching    Current Outpatient Prescriptions  Medication Sig Dispense Refill  . Cholecalciferol (VITAMIN D3) 1000 UNITS CAPS Take 1 capsule by mouth daily.       . metoprolol succinate (TOPROL-XL) 25 MG 24 hr tablet Take 12.5 mg by mouth 2 (two) times daily.      . Multiple Vitamin (MULTIVITAMIN WITH MINERALS) TABS Take 1 tablet by mouth daily.      . [DISCONTINUED] gabapentin (NEURONTIN) 100 MG capsule Take 100 mg by mouth at bedtime.         No current facility-administered medications for this visit.    Past Medical History  Diagnosis Date  . Dysautonomia   . SVT (supraventricular tachycardia) 2004    S/P slow path modification    Past Surgical History  Procedure Laterality Date  . Cesarean section    . Ventricular ablation surgery      For supraventricular tachycardia    Family History  Problem Relation Age of Onset  . Hypertension Other   . Diabetes Other   . Depression Other     History   Social History  . Marital Status: Married    Spouse Name: N/A    Number of Children: N/A  . Years of Education: N/A   Occupational History  . Not on file.   Social History Main Topics  . Smoking status: Never Smoker   . Smokeless tobacco: Not on file  . Alcohol Use: No     Comment: Denies  . Drug Use: No     Comment: Denies  . Sexually Active: Yes    Birth Control/ Protection: Surgical   Other Topics Concern  . Not on file   Social History Narrative  . No narrative on file    Review of Systems:  All systems reviewed.  They are negative to the above problem except as previously stated.  Vital Signs: BP 100/72  Pulse 87  Ht 5\' 1"  (1.549 m)  Wt 190 lb  (86.183 kg)  BMI 35.92 kg/m2  LMP 07/29/2012  Physical Exam Patient is in NAD HEENT:  Normocephalic, atraumatic. EOMI, PERRLA.  Neck: JVP is normal.  No bruits.  Lungs: clear to auscultation. No rales no wheezes.  Heart: Regular rate and rhythm. Normal S1, S2. No S3.   No significant murmurs. PMI not displaced.  Abdomen:  Supple, nontender. Normal bowel sounds. No masses. No hepatomegaly.  Extremities:   Good distal pulses throughout. No lower extremity edema.  Musculoskeletal :moving all extremities.  Neuro:   alert and oriented x3.  CN II-XII grossly intact.   Assessment and Plan:  1.  Dysautonomia  Patient's symptoms have improved.  Not orthostatic  Would continue current regimen  2.  Obesity.  Will refer to dietary  Needs to lose wt  Morbidly obese.  3.  Lipids  Check tomorrow  Diet is not good  Had bacon and eggs this AM   Had burger and fries for lunch  4.  Thinning hair  I do not think due to metoprolol  On tiny dose.  TSH is normal  Follow  F/U in 1 year.

## 2012-08-07 NOTE — Patient Instructions (Addendum)
Fasting Lipid Panel in the morning 09/05/2012  Your physician wants you to follow-up in: 1 year with Dr. Tenny Craw.  You will receive a reminder letter in the mail two months in advance. If you don't receive a letter, please call our office to schedule the follow-up appointment.   You have been referred to [facility] for weight loss counseling.

## 2012-08-08 ENCOUNTER — Other Ambulatory Visit (INDEPENDENT_AMBULATORY_CARE_PROVIDER_SITE_OTHER): Payer: BC Managed Care – PPO

## 2012-08-08 DIAGNOSIS — E785 Hyperlipidemia, unspecified: Secondary | ICD-10-CM

## 2012-08-08 DIAGNOSIS — E663 Overweight: Secondary | ICD-10-CM

## 2012-08-08 LAB — LIPID PANEL
Cholesterol: 151 mg/dL (ref 0–200)
HDL: 45.7 mg/dL (ref >39.00)
VLDL: 20.2 mg/dL (ref 0.0–40.0)

## 2012-08-25 ENCOUNTER — Ambulatory Visit: Payer: Self-pay | Admitting: Dietician

## 2013-01-10 ENCOUNTER — Emergency Department (HOSPITAL_COMMUNITY)
Admission: EM | Admit: 2013-01-10 | Discharge: 2013-01-10 | Disposition: A | Discharge status: Home / Self Care | Payer: Self-pay | Attending: Emergency Medicine | Admitting: Emergency Medicine

## 2013-01-10 ENCOUNTER — Encounter (HOSPITAL_COMMUNITY): Payer: Self-pay

## 2013-01-10 DIAGNOSIS — Z3202 Encounter for pregnancy test, result negative: Secondary | ICD-10-CM | POA: Insufficient documentation

## 2013-01-10 DIAGNOSIS — Z8679 Personal history of other diseases of the circulatory system: Secondary | ICD-10-CM | POA: Insufficient documentation

## 2013-01-10 DIAGNOSIS — Z79899 Other long term (current) drug therapy: Secondary | ICD-10-CM | POA: Insufficient documentation

## 2013-01-10 DIAGNOSIS — Z8669 Personal history of other diseases of the nervous system and sense organs: Secondary | ICD-10-CM | POA: Insufficient documentation

## 2013-01-10 DIAGNOSIS — R630 Anorexia: Secondary | ICD-10-CM | POA: Insufficient documentation

## 2013-01-10 DIAGNOSIS — R112 Nausea with vomiting, unspecified: Secondary | ICD-10-CM | POA: Insufficient documentation

## 2013-01-10 DIAGNOSIS — R109 Unspecified abdominal pain: Secondary | ICD-10-CM

## 2013-01-10 DIAGNOSIS — R197 Diarrhea, unspecified: Secondary | ICD-10-CM | POA: Insufficient documentation

## 2013-01-10 DIAGNOSIS — R1084 Generalized abdominal pain: Secondary | ICD-10-CM | POA: Insufficient documentation

## 2013-01-10 DIAGNOSIS — B80 Enterobiasis: Secondary | ICD-10-CM | POA: Insufficient documentation

## 2013-01-10 LAB — CBC WITH DIFFERENTIAL/PLATELET
Basophils Relative: 1 % (ref 0–1)
Eosinophils Absolute: 0.1 10*3/uL (ref 0.0–0.7)
HCT: 39.9 % (ref 36.0–46.0)
Hemoglobin: 13.4 g/dL (ref 12.0–15.0)
MCH: 29.8 pg (ref 26.0–34.0)
MCHC: 33.6 g/dL (ref 30.0–36.0)
Monocytes Absolute: 0.8 10*3/uL (ref 0.1–1.0)
Monocytes Relative: 7 % (ref 3–12)
Neutro Abs: 8.1 10*3/uL — ABNORMAL HIGH (ref 1.7–7.7)

## 2013-01-10 LAB — URINALYSIS, ROUTINE W REFLEX MICROSCOPIC
Bilirubin Urine: NEGATIVE
Glucose, UA: NEGATIVE mg/dL
Ketones, ur: NEGATIVE mg/dL
Protein, ur: NEGATIVE mg/dL

## 2013-01-10 LAB — COMPREHENSIVE METABOLIC PANEL
Albumin: 3.9 g/dL (ref 3.5–5.2)
BUN: 11 mg/dL (ref 6–23)
Chloride: 97 mEq/L (ref 96–112)
Creatinine, Ser: 0.63 mg/dL (ref 0.50–1.10)
GFR calc non Af Amer: >90 mL/min (ref >90)
Total Bilirubin: 0.2 mg/dL — ABNORMAL LOW (ref 0.3–1.2)

## 2013-01-10 LAB — LIPASE, BLOOD: Lipase: 41 U/L (ref 11–59)

## 2013-01-10 LAB — URINE MICROSCOPIC-ADD ON

## 2013-01-10 MED ORDER — ONDANSETRON 8 MG PO TBDP: 8.0000 mg | ORAL_TABLET | Freq: Three times a day (TID) | ORAL | Status: DC | PRN

## 2013-01-10 MED ORDER — ONDANSETRON 4 MG PO TBDP
4.0000 mg | ORAL_TABLET | Freq: Once | ORAL | Status: AC
  Administered 2013-01-10: 4 mg via ORAL
  Filled 2013-01-10: qty 1

## 2013-01-10 MED ORDER — ALBENDAZOLE 200 MG PO TABS: 400.0000 mg | ORAL_TABLET | Freq: Once | ORAL | Status: DC

## 2013-01-10 NOTE — ED Notes (Signed)
Pt noticed ?? Pinworms in her stool over past several days, also having lower abd pain with nausea

## 2013-01-10 NOTE — ED Notes (Signed)
Pt alert & oriented x4, stable gait. Patient given discharge instructions, paperwork & prescription(s). Patient  instructed to stop at the registration desk to finish any additional paperwork. Patient verbalized understanding. Pt left department w/ no further questions. 

## 2013-01-10 NOTE — ED Provider Notes (Signed)
CSN: [id number]     Arrival date & time 01/10/13  0038 History     First MD Initiated Contact with Patient 01/10/13 0105     Chief Complaint  Patient presents with  . Abdominal Pain  . pin worms    (Consider location/radiation/quality/duration/timing/severity/associated sxs/prior Treatment) Patient is a 27 y.o. female presenting with abdominal pain. The history is provided by the patient.  Abdominal Pain This is a new problem. Associated symptoms include abdominal pain. Pertinent negatives include no chest pain, no headaches and no shortness of breath.   patient's had abdominal pain for last several days. States she is having some diarrhea and has had nausea. She also states she has seen some white "worms" in her stool. She states this has happened a few times. She states a family member at her house a month ago also found some worms. She thinks she may have pinworms. No fevers. She's had a decreased appetite. No dysuria. The pain is in the upper abdomen and also in the lower abdomen.  Past Medical History  Diagnosis Date  . Dysautonomia   . SVT (supraventricular tachycardia) 2004    S/P slow path modification   Past Surgical History  Procedure Laterality Date  . Cesarean section    . Ventricular ablation surgery      For supraventricular tachycardia   Family History  Problem Relation Age of Onset  . Hypertension Other   . Diabetes Other   . Depression Other    History  Substance Use Topics  . Smoking status: Never Smoker   . Smokeless tobacco: Not on file  . Alcohol Use: No     Comment: Denies   OB History   Grav Para Term Preterm Abortions TAB SAB Ect Mult Living   4 4 4             Review of Systems  Constitutional: Negative for activity change and appetite change.  HENT: Negative for neck stiffness.   Eyes: Negative for pain.  Respiratory: Negative for chest tightness and shortness of breath.   Cardiovascular: Negative for chest pain and leg swelling.   Gastrointestinal: Positive for vomiting, abdominal pain and diarrhea. Negative for nausea.  Genitourinary: Negative for flank pain.  Musculoskeletal: Negative for back pain.  Skin: Negative for rash.  Neurological: Negative for weakness, numbness and headaches.  Psychiatric/Behavioral: Negative for behavioral problems.    Allergies  Vancomycin  Home Medications   Current Outpatient Rx  Name  Route  Sig  Dispense  Refill  . Cholecalciferol (VITAMIN D3) 1000 UNITS CAPS   Oral   Take 1 capsule by mouth daily.          . metoprolol succinate (TOPROL-XL) 25 MG 24 hr tablet   Oral   Take 12.5 mg by mouth 2 (two) times daily.         Marland Kitchen albendazole (ALBENZA) 200 MG tablet   Oral   Take 2 tablets (400 mg total) by mouth once. Repeat in 2 weeks   4 tablet   0   . Multiple Vitamin (MULTIVITAMIN WITH MINERALS) TABS   Oral   Take 1 tablet by mouth daily.         . ondansetron (ZOFRAN-ODT) 8 MG disintegrating tablet   Oral   Take 1 tablet (8 mg total) by mouth every 8 (eight) hours as needed for nausea.   10 tablet   0    BP 119/81  Temp(Src) 98.3 F (36.8 C) (Oral)  Resp 20  Ht  5\' 1"  (1.549 m)  Wt 190 lb (86.183 kg)  BMI 35.92 kg/m2  SpO2 99%  LMP 12/27/2012 Physical Exam  Nursing note and vitals reviewed. Constitutional: She is oriented to person, place, and time. She appears well-developed and well-nourished.  HENT:  Head: Normocephalic and atraumatic.  Eyes: EOM are normal. Pupils are equal, round, and reactive to light.  Neck: Normal range of motion. Neck supple.  Cardiovascular: Normal rate, regular rhythm and normal heart sounds.   No murmur heard. Pulmonary/Chest: Effort normal and breath sounds normal. No respiratory distress. She has no wheezes. She has no rales.  Abdominal: Soft. Bowel sounds are normal. She exhibits no distension. There is tenderness. There is no rebound and no guarding.  Mild diffuse abdominal tenderness. No rebound or guarding.   Musculoskeletal: Normal range of motion.  Neurological: She is alert and oriented to person, place, and time. No cranial nerve deficit.  Skin: Skin is warm and dry.  Psychiatric: She has a normal mood and affect. Her speech is normal.    ED Course   Procedures (including critical care time)  Labs Reviewed  URINALYSIS, ROUTINE W REFLEX MICROSCOPIC - Abnormal; Notable for the following:    Hgb urine dipstick TRACE (*)    All other components within normal limits  CBC WITH DIFFERENTIAL - Abnormal; Notable for the following:    WBC 12.6 (*)    Neutro Abs 8.1 (*)    All other components within normal limits  COMPREHENSIVE METABOLIC PANEL - Abnormal; Notable for the following:    Glucose, Bld 106 (*)    Total Bilirubin 0.2 (*)    All other components within normal limits  PREGNANCY, URINE  LIPASE, BLOOD  URINE MICROSCOPIC-ADD ON   No results found. 1. Abdominal pain     MDM  Patient with mild abdominal pain. Lab work is overall reassuring except for a minimally elevated white count. Patient did not have stool with warm saline here, however she seems to be a relatively reliable historian. She'll follow with her PCP but was given treatment for possible pinworm infection.  Juliet Rude. Rubin Payor, MD 01/10/13 [phone]

## 2013-08-12 ENCOUNTER — Other Ambulatory Visit: Payer: Self-pay | Admitting: Family Medicine

## 2013-08-12 DIAGNOSIS — R1310 Dysphagia, unspecified: Secondary | ICD-10-CM

## 2013-08-14 ENCOUNTER — Ambulatory Visit
Admission: RE | Admit: 2013-08-14 | Discharge: 2013-08-14 | Disposition: A | Discharge status: Home / Self Care | Payer: BC Managed Care – PPO | Source: Ambulatory Visit | Attending: Family Medicine | Admitting: Family Medicine

## 2013-08-14 DIAGNOSIS — R1310 Dysphagia, unspecified: Secondary | ICD-10-CM

## 2013-09-06 ENCOUNTER — Emergency Department (HOSPITAL_COMMUNITY): Payer: BC Managed Care – PPO

## 2013-09-06 ENCOUNTER — Encounter (HOSPITAL_COMMUNITY): Payer: Self-pay | Admitting: Emergency Medicine

## 2013-09-06 ENCOUNTER — Emergency Department (HOSPITAL_COMMUNITY)
Admission: EM | Admit: 2013-09-06 | Discharge: 2013-09-06 | Disposition: A | Discharge status: Home / Self Care | Payer: BC Managed Care – PPO | Attending: Emergency Medicine | Admitting: Emergency Medicine

## 2013-09-06 DIAGNOSIS — Z9889 Other specified postprocedural states: Secondary | ICD-10-CM | POA: Insufficient documentation

## 2013-09-06 DIAGNOSIS — Z79899 Other long term (current) drug therapy: Secondary | ICD-10-CM | POA: Insufficient documentation

## 2013-09-06 DIAGNOSIS — Z3202 Encounter for pregnancy test, result negative: Secondary | ICD-10-CM | POA: Insufficient documentation

## 2013-09-06 DIAGNOSIS — Z8679 Personal history of other diseases of the circulatory system: Secondary | ICD-10-CM | POA: Insufficient documentation

## 2013-09-06 DIAGNOSIS — Z8669 Personal history of other diseases of the nervous system and sense organs: Secondary | ICD-10-CM | POA: Insufficient documentation

## 2013-09-06 DIAGNOSIS — R350 Frequency of micturition: Secondary | ICD-10-CM

## 2013-09-06 LAB — URINALYSIS, ROUTINE W REFLEX MICROSCOPIC
Bilirubin Urine: NEGATIVE
GLUCOSE, UA: NEGATIVE mg/dL
Ketones, ur: NEGATIVE mg/dL
Leukocytes, UA: NEGATIVE
Nitrite: NEGATIVE
Protein, ur: NEGATIVE mg/dL
Specific Gravity, Urine: <1.005 — ABNORMAL LOW (ref 1.005–1.030)
Urobilinogen, UA: 0.2 mg/dL (ref 0.0–1.0)
pH: 5.5 (ref 5.0–8.0)

## 2013-09-06 LAB — URINE MICROSCOPIC-ADD ON

## 2013-09-06 LAB — POC URINE PREG, ED: Preg Test, Ur: NEGATIVE

## 2013-09-06 MED ORDER — IBUPROFEN 800 MG PO TABS
800.0000 mg | ORAL_TABLET | Freq: Once | ORAL | Status: AC
  Administered 2013-09-06: 800 mg via ORAL
  Filled 2013-09-06: qty 1

## 2013-09-06 MED ORDER — PHENAZOPYRIDINE HCL 200 MG PO TABS: 200.0000 mg | ORAL_TABLET | Freq: Three times a day (TID) | ORAL | Status: DC

## 2013-09-06 MED ORDER — CEPHALEXIN 500 MG PO CAPS: 500.0000 mg | ORAL_CAPSULE | Freq: Four times a day (QID) | ORAL | Status: DC

## 2013-09-06 MED ORDER — CEPHALEXIN 500 MG PO CAPS
500.0000 mg | ORAL_CAPSULE | Freq: Once | ORAL | Status: AC
  Administered 2013-09-06: 500 mg via ORAL
  Filled 2013-09-06: qty 1

## 2013-09-06 NOTE — Discharge Instructions (Signed)
Urinary Tract Infection °A urinary tract infection (UTI) can occur any place along the urinary tract. The tract includes the kidneys, ureters, bladder, and urethra. A type of germ called bacteria often causes a UTI. UTIs are often helped with antibiotic medicine.  °HOME CARE  °· If given, take antibiotics as told by your doctor. Finish them even if you start to feel better. °· Drink enough fluids to keep your pee (urine) clear or pale yellow. °· Avoid tea, drinks with caffeine, and bubbly (carbonated) drinks. °· Pee often. Avoid holding your pee in for a long time. °· Pee before and after having sex (intercourse). °· Wipe from front to back after you poop (bowel movement) if you are a woman. Use each tissue only once. °GET HELP RIGHT AWAY IF:  °· You have back pain. °· You have lower belly (abdominal) pain. °· You have chills. °· You feel sick to your stomach (nauseous). °· You throw up (vomit). °· Your burning or discomfort with peeing does not go away. °· You have a fever. °· Your symptoms are not better in 3 days. °MAKE SURE YOU:  °· Understand these instructions. °· Will watch your condition. °· Will get help right away if you are not doing well or get worse. °Document Released: 11/14/2007 Document Revised: 02/20/2012 Document Reviewed: 12/27/2011 °[employer]® Patient Information ©2014 [employer]. ° °

## 2013-09-06 NOTE — ED Notes (Addendum)
Pt. States that she has had left abdominal pain for 3 months. Pt. Reports right flank pain starting yesterday. Pt. Reports frequent urination and increased thirst. Pt. Denies pain with urination. Pt. Reports diarrhea. Pt. Denies vomiting.

## 2013-09-06 NOTE — ED Provider Notes (Signed)
CSN: [id number]     Arrival date & time 09/06/13  0256 History   First MD Initiated Contact with Patient 09/06/13 0309     Chief Complaint  Patient presents with  . Abdominal Pain      HPI Patient reports developing urinary frequency with mild suprapubic discomfort this evening.  No hematuria.  No history of kidney stones.  No history of frequent urinary tract infections.  No fevers or chills.  No nausea or vomiting.  She does report mild discomfort across her low back as well.  No vaginal complaints or vaginal discharge.  Symptoms are mild in severity.  She does not want anything strong for pain.  She does agree to ibuprofen   Past Medical History  Diagnosis Date  . Dysautonomia   . SVT (supraventricular tachycardia) 2004    S/P slow path modification   Past Surgical History  Procedure Laterality Date  . Cesarean section    . Ventricular ablation surgery      For supraventricular tachycardia   Family History  Problem Relation Age of Onset  . Hypertension Other   . Diabetes Other   . Depression Other    History  Substance Use Topics  . Smoking status: Never Smoker   . Smokeless tobacco: Not on file  . Alcohol Use: No     Comment: Denies   OB History   Grav Para Term Preterm Abortions TAB SAB Ect Mult Living   4 4 4             Review of Systems  All other systems reviewed and are negative.      Allergies  Vancomycin  Home Medications   Current Outpatient Rx  Name  Route  Sig  Dispense  Refill  . Cholecalciferol (VITAMIN D3) 1000 UNITS CAPS   Oral   Take 1 capsule by mouth daily.          . metoprolol succinate (TOPROL-XL) 25 MG 24 hr tablet   Oral   Take 12.5 mg by mouth 2 (two) times daily.         . Multiple Vitamin (MULTIVITAMIN WITH MINERALS) TABS   Oral   Take 1 tablet by mouth daily.         Marland Kitchen. albendazole (ALBENZA) 200 MG tablet   Oral   Take 2 tablets (400 mg total) by mouth once. Repeat in 2 weeks   4 tablet   0   . cephALEXin  (KEFLEX) 500 MG capsule   Oral   Take 1 capsule (500 mg total) by mouth 4 (four) times daily.   20 capsule   0   . ondansetron (ZOFRAN-ODT) 8 MG disintegrating tablet   Oral   Take 1 tablet (8 mg total) by mouth every 8 (eight) hours as needed for nausea.   10 tablet   0   . phenazopyridine (PYRIDIUM) 200 MG tablet   Oral   Take 1 tablet (200 mg total) by mouth 3 (three) times daily.   6 tablet   0    BP 123/73  Pulse 86  Temp(Src) 97.6 F (36.4 C) (Oral)  Resp 18  Ht 5\' 1"  (1.549 m)  Wt 194 lb (87.998 kg)  BMI 36.67 kg/m2  SpO2 98%  LMP 08/23/2013 Physical Exam  Nursing note and vitals reviewed. Constitutional: She is oriented to person, place, and time. She appears well-developed and well-nourished. No distress.  HENT:  Head: Normocephalic and atraumatic.  Eyes: EOM are normal.  Neck: Normal range of  motion.  Cardiovascular: Normal rate, regular rhythm and normal heart sounds.   Pulmonary/Chest: Effort normal and breath sounds normal.  Abdominal: Soft. She exhibits no distension.  mild suprapubic tenderness  Musculoskeletal: Normal range of motion.  Neurological: She is alert and oriented to person, place, and time.  Skin: Skin is warm and dry.  Psychiatric: She has a normal mood and affect. Judgment normal.    ED Course  Procedures (including critical care time) Labs Review Labs Reviewed  URINALYSIS, ROUTINE W REFLEX MICROSCOPIC - Abnormal; Notable for the following:    Specific Gravity, Urine <1.005 (*)    Hgb urine dipstick SMALL (*)    All other components within normal limits  URINE MICROSCOPIC-ADD ON - Abnormal; Notable for the following:    Squamous Epithelial / LPF FEW (*)    All other components within normal limits  URINE CULTURE  POC URINE PREG, ED   Imaging Review Ct Abdomen Pelvis Wo Contrast  09/06/2013   CLINICAL DATA:  Right flank pain in urinary frequency  EXAM: CT ABDOMEN AND PELVIS WITHOUT CONTRAST  TECHNIQUE: Multidetector CT imaging  of the abdomen and pelvis was performed following the standard protocol without intravenous contrast.  COMPARISON:  None.  FINDINGS: BODY WALL: Unremarkable.  LOWER CHEST: Unremarkable.  ABDOMEN/PELVIS:  Liver: No focal abnormality.  Biliary: No evidence of biliary obstruction or stone.  Pancreas: Unremarkable.  Spleen: Unremarkable.  Adrenals: Unremarkable.  Kidneys and ureters: No hydronephrosis or stone.  Bladder: Unremarkable.  Reproductive: Tubal ligation.  Bowel: No obstruction. Normal appendix.  Retroperitoneum: Minimal haziness of small bowel mesenteric fat, usually related to remote inflammation. No adenopathy.  Peritoneum: No free fluid or gas.  Vascular: No acute abnormality.  OSSEOUS: No acute abnormalities.  IMPRESSION: No hydronephrosis or nephrolithiasis.   Electronically Signed   By: Tiburcio Pea M.D.   On: 09/06/2013 05:01  I personally reviewed the imaging tests through PACS system I reviewed available ER/hospitalization records through the EMR    EKG Interpretation None      MDM   Final diagnoses:  Urinary frequency    Patient with symptoms consistent of urinary tract infection.  Urine is not overly impressive.  Urine culture will be sent.  Patient be treated empirically with Keflex) DM.  CT scan was obtained given the patient's urinary frequency and a small amount of hematuria is thought this could represent a distal ureteral stone.  CT without ureteral stone present.    Lyanne Co, MD 09/06/13 [phone]

## 2013-09-07 LAB — URINE CULTURE
Colony Count: NO GROWTH
Culture: NO GROWTH

## 2014-02-24 ENCOUNTER — Encounter: Payer: Self-pay | Admitting: Physician Assistant

## 2014-02-24 ENCOUNTER — Ambulatory Visit (INDEPENDENT_AMBULATORY_CARE_PROVIDER_SITE_OTHER): Payer: BC Managed Care – PPO | Admitting: Physician Assistant

## 2014-02-24 VITALS — BP 103/74 | HR 109 | Ht 61.0 in | Wt 202.8 lb

## 2014-02-24 DIAGNOSIS — E669 Obesity, unspecified: Secondary | ICD-10-CM | POA: Insufficient documentation

## 2014-02-24 DIAGNOSIS — G901 Familial dysautonomia [Riley-Day]: Secondary | ICD-10-CM

## 2014-02-24 DIAGNOSIS — R000 Tachycardia, unspecified: Secondary | ICD-10-CM

## 2014-02-24 DIAGNOSIS — G909 Disorder of the autonomic nervous system, unspecified: Secondary | ICD-10-CM

## 2014-02-24 DIAGNOSIS — I959 Hypotension, unspecified: Secondary | ICD-10-CM

## 2014-02-24 MED ORDER — METOPROLOL TARTRATE 25 MG PO TABS: 25.0000 mg | ORAL_TABLET | Freq: Two times a day (BID) | ORAL | Status: DC

## 2014-02-24 MED ORDER — METOPROLOL TARTRATE 25 MG PO TABS: 12.5000 mg | ORAL_TABLET | Freq: Two times a day (BID) | ORAL | Status: DC

## 2014-02-24 NOTE — Assessment & Plan Note (Signed)
Exercise and weight loss program such as Weight Watchers was recommended to the patient.

## 2014-02-24 NOTE — Assessment & Plan Note (Signed)
Patient is markedly orthostatic with blood pressure dropping from 125-103 sitting to standing. Recommend increasing fluids and liberalize her salt intake. She refuses to take salt tablets. Hopefully metoprolol will help.

## 2014-02-24 NOTE — Assessment & Plan Note (Addendum)
Patient has had increased symptoms of dizziness and rapid heartbeat with change in position. Her heart rate does jump up with change of position blood pressure does drop. She's been out of her metoprolol for while. We will resume metoprolol 25 mg one half tablet twice a day. Increase fluids and be liberal with her salt. Follow up with Dr. Tenny Craw in 6 months

## 2014-02-24 NOTE — Progress Notes (Signed)
HPI: This is a 28 year old female patient of Dr. Tenny Craw who has history of dysautonomia and SVT in 2004 status post slow pathway modification.   The patient comes in today complaining of one-week history of worsening symptoms. She says she gets dizzy when she gets up quickly from a lying or sitting position. She says her heart rate jumps up rapidly. She claims to be drinking a lot of fluids and eating salty foods. She's been out of her metoprolol for a long time. It does seem to help when she takes it. Sometimes she has chest pain with the rapid heartbeat.  Allergies  Allergen Reactions  . Vancomycin Hives and Itching     Current Outpatient Prescriptions  Medication Sig Dispense Refill  . albendazole (ALBENZA) 200 MG tablet Take 2 tablets (400 mg total) by mouth once. Repeat in 2 weeks  4 tablet  0  . cephALEXin (KEFLEX) 500 MG capsule Take 1 capsule (500 mg total) by mouth 4 (four) times daily.  20 capsule  0  . Cholecalciferol (VITAMIN D3) 1000 UNITS CAPS Take 1 capsule by mouth daily.       . metoprolol succinate (TOPROL-XL) 25 MG 24 hr tablet Take 12.5 mg by mouth 2 (two) times daily.      . Multiple Vitamin (MULTIVITAMIN WITH MINERALS) TABS Take 1 tablet by mouth daily.      . ondansetron (ZOFRAN-ODT) 8 MG disintegrating tablet Take 1 tablet (8 mg total) by mouth every 8 (eight) hours as needed for nausea.  10 tablet  0  . phenazopyridine (PYRIDIUM) 200 MG tablet Take 1 tablet (200 mg total) by mouth 3 (three) times daily.  6 tablet  0  . [DISCONTINUED] gabapentin (NEURONTIN) 100 MG capsule Take 100 mg by mouth at bedtime.         No current facility-administered medications for this visit.    Past Medical History  Diagnosis Date  . Dysautonomia   . SVT (supraventricular tachycardia) 2004    S/P slow path modification    Past Surgical History  Procedure Laterality Date  . Cesarean section    . Ventricular ablation surgery      For supraventricular tachycardia     Family History  Problem Relation Age of Onset  . Hypertension Other   . Diabetes Other   . Depression Other     History   Social History  . Marital Status: Married    Spouse Name: N/A    Number of Children: N/A  . Years of Education: N/A   Occupational History  . Not on file.   Social History Main Topics  . Smoking status: Never Smoker   . Smokeless tobacco: Not on file  . Alcohol Use: No     Comment: Denies  . Drug Use: No     Comment: Denies  . Sexual Activity: Yes    Birth Control/ Protection: Surgical   Other Topics Concern  . Not on file   Social History Narrative  . No narrative on file    ROS: Trouble with panic attacks currently better controlled on Zoloft, trouble with low blood sugars checked for diabetes by primary care and was told she was not diabetic. Weight gain. Otherwise See history of present illness otherwise negative  BP 122/76  Pulse 89  Ht  (1.549 m)  Wt 202 lb 12.8 oz (91.989 kg)  BMI 38.34 kg/m2  PHYSICAL EXAM: Obese, in no acute distress. Neck: No JVD, HJR, Bruit, or thyroid  enlargement  Lungs: No tachypnea, clear without wheezing, rales, or rhonchi  Cardiovascular: RRR, PMI not displaced, heart sounds normal, no murmurs, gallops, bruit, thrill, or heave.  Abdomen: BS normal. Soft without organomegaly, masses, lesions or tenderness.  Extremities: without cyanosis, clubbing or edema. Good distal pulses bilateral  SKin: Warm, no lesions or rashes   Musculoskeletal: No deformities  Neuro: no focal signs   Wt Readings from Last 3 Encounters:  09/06/13 194 lb (87.998 kg)  01/10/13 190 lb (86.183 kg)  08/07/12 190 lb (86.183 kg)     EKG: Normal sinus rhythm at 89 beats per minute, no acute change

## 2014-02-24 NOTE — Patient Instructions (Signed)
Your physician has recommended you make the following change in your medication:    1. START TAKING 1/2 TABLET OF METOPROLOL  25 MG TWICE A DAY   Your physician wants you to follow-up in: WITH DR ROSS IN 6 MONTHS  You will receive a reminder letter in the mail two months in advance. If you don't receive a letter, please call our office to schedule the follow-up appointment.   INCREASE FLUID AND SALT INTAKE IN YOUR DIET

## 2014-04-12 ENCOUNTER — Encounter: Payer: Self-pay | Admitting: Physician Assistant

## 2014-05-09 ENCOUNTER — Emergency Department (HOSPITAL_COMMUNITY): Payer: BC Managed Care – PPO

## 2014-05-09 ENCOUNTER — Emergency Department (HOSPITAL_COMMUNITY)
Admission: EM | Admit: 2014-05-09 | Discharge: 2014-05-10 | Disposition: A | Discharge status: Home / Self Care | Payer: BC Managed Care – PPO | Attending: Emergency Medicine | Admitting: Emergency Medicine

## 2014-05-09 ENCOUNTER — Encounter (HOSPITAL_COMMUNITY): Payer: Self-pay | Admitting: Emergency Medicine

## 2014-05-09 DIAGNOSIS — R002 Palpitations: Secondary | ICD-10-CM | POA: Diagnosis not present

## 2014-05-09 DIAGNOSIS — R000 Tachycardia, unspecified: Secondary | ICD-10-CM | POA: Insufficient documentation

## 2014-05-09 DIAGNOSIS — Z8679 Personal history of other diseases of the circulatory system: Secondary | ICD-10-CM | POA: Insufficient documentation

## 2014-05-09 DIAGNOSIS — Z79899 Other long term (current) drug therapy: Secondary | ICD-10-CM | POA: Diagnosis not present

## 2014-05-09 DIAGNOSIS — R42 Dizziness and giddiness: Secondary | ICD-10-CM | POA: Insufficient documentation

## 2014-05-09 DIAGNOSIS — Z3202 Encounter for pregnancy test, result negative: Secondary | ICD-10-CM | POA: Diagnosis not present

## 2014-05-09 DIAGNOSIS — R079 Chest pain, unspecified: Secondary | ICD-10-CM | POA: Diagnosis present

## 2014-05-09 HISTORY — DX: Postural orthostatic tachycardia syndrome (POTS): G90.A

## 2014-05-09 HISTORY — DX: Tachycardia, unspecified: R00.0

## 2014-05-09 HISTORY — DX: Orthostatic hypotension: I95.1

## 2014-05-09 HISTORY — DX: Other specified cardiac arrhythmias: I49.8

## 2014-05-09 LAB — CBC
HEMATOCRIT: 39.9 % (ref 36.0–46.0)
Hemoglobin: 12.7 g/dL (ref 12.0–15.0)
MCH: 28.2 pg (ref 26.0–34.0)
MCHC: 31.8 g/dL (ref 30.0–36.0)
MCV: 88.7 fL (ref 78.0–100.0)
PLATELETS: 349 10*3/uL (ref 150–400)
RBC: 4.5 MIL/uL (ref 3.87–5.11)
RDW: 13.1 % (ref 11.5–15.5)
WBC: 10.1 10*3/uL (ref 4.0–10.5)

## 2014-05-09 NOTE — ED Notes (Signed)
C/o discomfort to center of chest with mild sob, headache, dizziness, and nausea since 3pm.  Reports feeling like heart was racing for a few seconds this afternoon.

## 2014-05-10 LAB — I-STAT TROPONIN, ED: Troponin i, poc: 0 ng/mL (ref 0.00–0.08)

## 2014-05-10 LAB — BASIC METABOLIC PANEL
ANION GAP: 12 (ref 5–15)
BUN: 15 mg/dL (ref 6–23)
CHLORIDE: 100 meq/L (ref 96–112)
CO2: 25 meq/L (ref 19–32)
CREATININE: 0.7 mg/dL (ref 0.50–1.10)
Calcium: 9.2 mg/dL (ref 8.4–10.5)
GFR calc Af Amer: >90 mL/min (ref >90)
GFR calc non Af Amer: >90 mL/min (ref >90)
GLUCOSE: 107 mg/dL — AB (ref 70–99)
Potassium: 4.4 mEq/L (ref 3.7–5.3)
Sodium: 137 mEq/L (ref 137–147)

## 2014-05-10 LAB — PRO B NATRIURETIC PEPTIDE: Pro B Natriuretic peptide (BNP): <5 pg/mL (ref 0–125)

## 2014-05-10 LAB — POC URINE PREG, ED: Preg Test, Ur: NEGATIVE

## 2014-05-10 MED ORDER — METOPROLOL TARTRATE 25 MG PO TABS
12.5000 mg | ORAL_TABLET | Freq: Once | ORAL | Status: AC
  Administered 2014-05-10: 12.5 mg via ORAL
  Filled 2014-05-10: qty 1

## 2014-05-10 MED ORDER — SODIUM CHLORIDE 0.9 % IV BOLUS (SEPSIS)
1000.0000 mL | Freq: Once | INTRAVENOUS | Status: AC
  Administered 2014-05-10: 1000 mL via INTRAVENOUS

## 2014-05-10 NOTE — ED Provider Notes (Signed)
CSN: [id number]     Arrival date & time 05/09/14  2253 History   First MD Initiated Contact with Patient 05/10/14 0005     Chief Complaint  Patient presents with  . Chest Pain     (Consider location/radiation/quality/duration/timing/severity/associated sxs/prior Treatment) Patient is a 28 y.o. female presenting with chest pain. The history is provided by the patient. No language interpreter was used.  Chest Pain Associated symptoms: dizziness and palpitations   Associated symptoms: no fever   Associated symptoms comment:  Chest tightness with periodic tachycardia/palpitations that are worse today than her normal. 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 She did not take her medications this morning. No fever, cough, SOB or vomiting.   Past Medical History  Diagnosis Date  . Dysautonomia   . SVT (supraventricular tachycardia) 2004    S/P slow path modification  . POTS (postural orthostatic tachycardia syndrome)    Past Surgical History  Procedure Laterality Date  . Cesarean section    . Ventricular ablation surgery      For supraventricular tachycardia   Family History  Problem Relation Age of Onset  . Hypertension Other   . Diabetes Other   . Depression Other    History  Substance Use Topics  . Smoking status: Never Smoker   . Smokeless tobacco: Not on file  . Alcohol Use: No     Comment: Denies   OB History    Gravida Para Term Preterm AB TAB SAB Ectopic Multiple Living   4 4 4             Review of Systems  Constitutional: Negative for fever and chills.  HENT: Negative.   Respiratory: Negative.   Cardiovascular: Positive for chest pain and palpitations.  Gastrointestinal: Negative.   Musculoskeletal: Negative.   Skin: Negative.   Neurological: Positive for dizziness and light-headedness. Negative for syncope.      Allergies  Vancomycin  Home Medications   Prior to Admission medications   Medication Sig Start Date  End Date Taking? Authorizing Provider  Cholecalciferol (VITAMIN D3) 1000 UNITS CAPS Take 1 capsule by mouth daily.     Historical Provider, MD  clonazePAM (KLONOPIN) 0.5 MG tablet Take 0.5 mg by mouth 2 (two) times daily.  02/19/14   Historical Provider, MD  metoprolol tartrate (LOPRESSOR) 25 MG tablet Take 0.5 tablets (12.5 mg total) by mouth 2 (two) times daily. 02/24/14   Dyann KiefMichele M Lenze, PA-C  sertraline (ZOLOFT) 50 MG tablet Take 25 mg by mouth daily. For two weeks then 50 mg 02/19/14   Historical Provider, MD   BP 115/78 mmHg  Pulse 97  Temp(Src) 97.9 F (36.6 C) (Oral)  Resp 17  Ht 5\' 1"  (1.549 m)  Wt 198 lb (89.812 kg)  BMI 37.43 kg/m2  SpO2 99%  LMP 05/02/2014 Physical Exam  Constitutional: She is oriented to person, place, and time. She appears well-developed and well-nourished.  HENT:  Head: Normocephalic.  Eyes: Pupils are equal, round, and reactive to light.  Neck: Normal range of motion. Neck supple.  Cardiovascular: Regular rhythm.  Tachycardia present.   Pulmonary/Chest: Effort normal and breath sounds normal. She has no wheezes. She has no rales.  Abdominal: Soft. Bowel sounds are normal. There is no tenderness. There is no rebound and no guarding.  Musculoskeletal: Normal range of motion.  Neurological: She is alert and oriented to person, place, and time. She has normal strength and normal reflexes. No sensory deficit. She displays a negative  Romberg sign. Coordination normal.  Ambulatory without ataxia. Coordination intact without deficits. CN's 3-12 grossly intact.   Skin: Skin is warm and dry. No rash noted.  Psychiatric: She has a normal mood and affect.    ED Course  Procedures (including critical care time) Labs Review Labs Reviewed  BASIC METABOLIC PANEL - Abnormal; Notable for the following:    Glucose, Bld 107 (*)    All other components within normal limits  CBC  PRO B NATRIURETIC PEPTIDE  I-STAT TROPOININ, ED  POC URINE PREG, ED   Results for  orders placed or performed during the hospital encounter of 05/09/14  CBC  Result Value Ref Range   WBC 10.1 4.0 - 10.5 K/uL   RBC 4.50 3.87 - 5.11 MIL/uL   Hemoglobin 12.7 12.0 - 15.0 g/dL   HCT 16.139.9 09.636.0 - 04.546.0 %   MCV 88.7 78.0 - 100.0 fL   MCH 28.2 26.0 - 34.0 pg   MCHC 31.8 30.0 - 36.0 g/dL   RDW 40.913.1 81.111.5 - 91.415.5 %   Platelets 349 150 - 400 K/uL  Basic metabolic panel  Result Value Ref Range   Sodium 137 137 - 147 mEq/L   Potassium 4.4 3.7 - 5.3 mEq/L   Chloride 100 96 - 112 mEq/L   CO2 25 19 - 32 mEq/L   Glucose, Bld 107 (H) 70 - 99 mg/dL   BUN 15 6 - 23 mg/dL   Creatinine, Ser 7.820.70 0.50 - 1.10 mg/dL   Calcium 9.2 8.4 - 95.610.5 mg/dL   GFR calc non Af Amer >90 >90 mL/min   GFR calc Af Amer >90 >90 mL/min   Anion gap 12 5 - 15  BNP (order ONLY if patient complains of dyspnea/SOB AND you have documented it for THIS visit)  Result Value Ref Range   Pro B Natriuretic peptide (BNP) <5.0 0 - 125 pg/mL  POC Urine Pregnancy, ED (pre-menopausal females) - do not order at [facility]  Result Value Ref Range   Preg Test, Ur NEGATIVE NEGATIVE     Imaging Review Dg Chest Port 1 View  05/09/2014   CLINICAL DATA:  Acute onset of discomfort at the center of the chest, with mild shortness of breath, headache, dizziness and nausea. Brief episode of tachycardia. Initial encounter.  EXAM: PORTABLE CHEST - 1 VIEW  COMPARISON:  Chest radiograph performed 05/06/2009  FINDINGS: The lungs are well-aerated and clear. There is no evidence of focal opacification, pleural effusion or pneumothorax.  The cardiomediastinal silhouette is within normal limits. No acute osseous abnormalities are seen.  IMPRESSION: No acute cardiopulmonary process seen.   Electronically Signed   By: Roanna RaiderJeffery  Chang M.D.   On: 05/09/2014 23:54     EKG Interpretation None      MDM   Final diagnoses:  None    1. Palpitations  No syncope or near syncope. IV fluids provided. Heart rate fluctuates between 100 and 110. No  orthostasis. metoprolol given in ED per her scheduled dosage. She is encouraged to take her medication as prescribed and follow up with Dr. Tenny Crawoss next week for recheck. She is considered appropriate for discharge.     Arnoldo HookerShari A Garan Frappier, PA-C 05/10/14 0157  Olivia Mackielga M Otter, MD 05/10/14 [phone]

## 2014-05-10 NOTE — Discharge Instructions (Signed)

## 2014-07-11 ENCOUNTER — Emergency Department (HOSPITAL_COMMUNITY)
Admission: EM | Admit: 2014-07-11 | Discharge: 2014-07-12 | Disposition: A | Discharge status: Home / Self Care | Payer: 59 | Attending: Emergency Medicine | Admitting: Emergency Medicine

## 2014-07-11 ENCOUNTER — Encounter (HOSPITAL_COMMUNITY): Payer: Self-pay | Admitting: *Deleted

## 2014-07-11 ENCOUNTER — Emergency Department (HOSPITAL_COMMUNITY): Payer: 59

## 2014-07-11 DIAGNOSIS — R000 Tachycardia, unspecified: Secondary | ICD-10-CM

## 2014-07-11 DIAGNOSIS — E876 Hypokalemia: Secondary | ICD-10-CM

## 2014-07-11 DIAGNOSIS — Z8669 Personal history of other diseases of the nervous system and sense organs: Secondary | ICD-10-CM | POA: Diagnosis not present

## 2014-07-11 DIAGNOSIS — R079 Chest pain, unspecified: Secondary | ICD-10-CM | POA: Insufficient documentation

## 2014-07-11 DIAGNOSIS — R109 Unspecified abdominal pain: Secondary | ICD-10-CM | POA: Diagnosis not present

## 2014-07-11 DIAGNOSIS — I471 Supraventricular tachycardia: Secondary | ICD-10-CM | POA: Insufficient documentation

## 2014-07-11 DIAGNOSIS — Z79899 Other long term (current) drug therapy: Secondary | ICD-10-CM | POA: Insufficient documentation

## 2014-07-11 LAB — CBC
HCT: 39.4 % (ref 36.0–46.0)
Hemoglobin: 12.9 g/dL (ref 12.0–15.0)
MCH: 29.2 pg (ref 26.0–34.0)
MCHC: 32.7 g/dL (ref 30.0–36.0)
MCV: 89.1 fL (ref 78.0–100.0)
PLATELETS: 305 10*3/uL (ref 150–400)
RBC: 4.42 MIL/uL (ref 3.87–5.11)
RDW: 13.2 % (ref 11.5–15.5)
WBC: 12.4 10*3/uL — ABNORMAL HIGH (ref 4.0–10.5)

## 2014-07-11 LAB — URINALYSIS, ROUTINE W REFLEX MICROSCOPIC
BILIRUBIN URINE: NEGATIVE
Glucose, UA: NEGATIVE mg/dL
Hgb urine dipstick: NEGATIVE
KETONES UR: NEGATIVE mg/dL
Leukocytes, UA: NEGATIVE
NITRITE: NEGATIVE
Protein, ur: NEGATIVE mg/dL
SPECIFIC GRAVITY, URINE: 1.01 (ref 1.005–1.030)
UROBILINOGEN UA: 0.2 mg/dL (ref 0.0–1.0)
pH: 7 (ref 5.0–8.0)

## 2014-07-11 LAB — BASIC METABOLIC PANEL
Anion gap: 6 (ref 5–15)
BUN: 13 mg/dL (ref 6–23)
CHLORIDE: 107 mmol/L (ref 96–112)
CO2: 24 mmol/L (ref 19–32)
CREATININE: 0.77 mg/dL (ref 0.50–1.10)
Calcium: 8.7 mg/dL (ref 8.4–10.5)
GFR calc Af Amer: >90 mL/min (ref >90)
GLUCOSE: 103 mg/dL — AB (ref 70–99)
POTASSIUM: 3 mmol/L — AB (ref 3.5–5.1)
Sodium: 137 mmol/L (ref 135–145)

## 2014-07-11 LAB — I-STAT TROPONIN, ED: TROPONIN I, POC: 0 ng/mL (ref 0.00–0.08)

## 2014-07-11 LAB — PREGNANCY, URINE: Preg Test, Ur: NEGATIVE

## 2014-07-11 NOTE — ED Notes (Signed)
Pt c/o chest pain with dizziness and nausea, pt states she just doesn't feel well

## 2014-07-12 LAB — HEPATIC FUNCTION PANEL
ALK PHOS: 82 U/L (ref 39–117)
ALT: 18 U/L (ref 0–35)
AST: 16 U/L (ref 0–37)
Albumin: 4.4 g/dL (ref 3.5–5.2)
BILIRUBIN DIRECT: 0.1 mg/dL (ref 0.0–0.5)
Indirect Bilirubin: 0.4 mg/dL (ref 0.3–0.9)
Total Bilirubin: 0.5 mg/dL (ref 0.3–1.2)
Total Protein: 8.3 g/dL (ref 6.0–8.3)

## 2014-07-12 LAB — LIPASE, BLOOD: Lipase: 29 U/L (ref 11–59)

## 2014-07-12 MED ORDER — KETOROLAC TROMETHAMINE 30 MG/ML IJ SOLN
30.0000 mg | Freq: Once | INTRAMUSCULAR | Status: AC
  Administered 2014-07-12: 30 mg via INTRAVENOUS
  Filled 2014-07-12: qty 1

## 2014-07-12 MED ORDER — POTASSIUM CHLORIDE CRYS ER 20 MEQ PO TBCR: 20.0000 meq | EXTENDED_RELEASE_TABLET | Freq: Two times a day (BID) | ORAL | Status: DC

## 2014-07-12 MED ORDER — POTASSIUM CHLORIDE CRYS ER 20 MEQ PO TBCR
40.0000 meq | EXTENDED_RELEASE_TABLET | Freq: Once | ORAL | Status: AC
  Administered 2014-07-12: 40 meq via ORAL
  Filled 2014-07-12: qty 2

## 2014-07-12 MED ORDER — SODIUM CHLORIDE 0.9 % IV BOLUS (SEPSIS)
1000.0000 mL | Freq: Once | INTRAVENOUS | Status: AC
  Administered 2014-07-12: 1000 mL via INTRAVENOUS

## 2014-07-12 NOTE — ED Provider Notes (Signed)
CSN: [id number]     Arrival date & time 07/11/14  2122 History   First MD Initiated Contact with Patient 07/11/14 2301     Chief Complaint  Patient presents with  . Chest Pain     (Consider location/radiation/quality/duration/timing/severity/associated sxs/prior Treatment) HPI  Patient reports when she woke up this morning she "didn't feel right". She states she felt "off". She reports this evening she started feeling off balance and feels like her legs are heavy. She denies a spinning sensation. She has had nausea without vomiting. She also states since 8 PM she's had a all, tight, and pressure type pain in the center of her chest that comes and goes. She cannot tell me how long it lasted when she gets it. She denies feeling her heart is racing during my interview and her heart rate was 109 during that discussion. She does admit to drinking several [employer] today. Patient does state however she feels like whenever she exerts herself her heart rate really jumps up. She also complains of an epigastric abdominal pain that she's had for several months and feels like it's "bulging". She states she feels full frequently. She states this is a new problem although she's been seen in the ED several times before in the past year and a half for abdominal pain. She states the feeling off balance is something old she's had before. She has been diagnosed with pots. She reports she was diagnosed with SVT at age 29 and had a ablation done at age 29.   [facility] Cardiologist Dr Tenny Crawoss, Interventionalist Dr Graciela HusbandsKlein  Past Medical History  Diagnosis Date  . Dysautonomia   . SVT (supraventricular tachycardia) 2004    S/P slow path modification  . POTS (postural orthostatic tachycardia syndrome)    Past Surgical History  Procedure Laterality Date  . Cesarean section    . Ventricular ablation surgery      For supraventricular tachycardia   Family History  Problem Relation Age of Onset    . Hypertension Other   . Diabetes Other   . Depression Other    History  Substance Use Topics  . Smoking status: Never Smoker   . Smokeless tobacco: Not on file  . Alcohol Use: No     Comment: Denies  denies ETOH Pt is afraid to work b/o dizzy spells   OB History    Gravida Para Term Preterm AB TAB SAB Ectopic Multiple Living   4 4 4             Review of Systems  All other systems reviewed and are negative.     Allergies  Vancomycin  Home Medications   Prior to Admission medications   Medication Sig Start Date End Date Taking? Authorizing Provider  clonazePAM (KLONOPIN) 0.5 MG tablet Take 0.5 mg by mouth 2 (two) times daily as needed for anxiety.  02/19/14  Yes Historical Provider, MD  metoprolol tartrate (LOPRESSOR) 25 MG tablet Take 0.5 tablets (12.5 mg total) by mouth 2 (two) times daily. 02/24/14  Yes Dyann KiefMichele M Lenze, PA-C  sertraline (ZOLOFT) 50 MG tablet Take 50 mg by mouth daily.  02/19/14  Yes Historical Provider, MD   BP 121/71 mmHg  Pulse 97  Temp(Src) 98.2 F (36.8 C) (Oral)  Resp 20  SpO2 100%  LMP 06/27/2014  Vital signs normal   Physical Exam  Constitutional: She is oriented to person, place, and time. She appears well-developed and well-nourished.  Non-toxic appearance. She does not appear  ill. No distress.  HENT:  Head: Normocephalic and atraumatic.  Right Ear: External ear normal.  Left Ear: External ear normal.  Nose: Nose normal. No mucosal edema or rhinorrhea.  Mouth/Throat: Oropharynx is clear and moist and mucous membranes are normal. No dental abscesses or uvula swelling.  Eyes: Conjunctivae and EOM are normal. Pupils are equal, round, and reactive to light.  Neck: Normal range of motion and full passive range of motion without pain. Neck supple.  Cardiovascular: Regular rhythm and normal heart sounds.  Tachycardia present.  Exam reveals no gallop and no friction rub.   No murmur heard. Pulmonary/Chest: Effort normal and breath sounds  normal. No respiratory distress. She has no wheezes. She has no rhonchi. She has no rales. She exhibits tenderness. She exhibits no crepitus.    Abdominal: Soft. Normal appearance and bowel sounds are normal. She exhibits no distension. There is no tenderness. There is no rebound and no guarding.    Area of pain noted  Musculoskeletal: Normal range of motion. She exhibits no edema or tenderness.  Moves all extremities well.   Neurological: She is alert and oriented to person, place, and time. She has normal strength. No cranial nerve deficit.  Skin: Skin is warm, dry and intact. No rash noted. No erythema. No pallor.  Psychiatric: She has a normal mood and affect. Her speech is normal and behavior is normal. Her mood appears not anxious.  Nursing note and vitals reviewed.   ED Course  Procedures (including critical care time)  Medications  sodium chloride 0.9 % bolus 1,000 mL (0 mLs Intravenous Stopped 07/12/14 0115)  ketorolac (TORADOL) 30 MG/ML injection 30 mg (30 mg Intravenous Given 07/12/14 0018)  potassium chloride SA (K-DUR,KLOR-CON) CR tablet 40 mEq (40 mEq Oral Given 07/12/14 0018)   Recheck at discharge. Patient is feeling much better after the IV fluids. Her heart rate is now 88 at rest with a pulse ox of 100%. We discussed stopping the caffeinated sodas that she drinks a lot of every day. She states Dr. Tenny Craw, her cardiologist, had told her she was losing sodium in her urine when she did a 24-hour collection. She was advised to follow-up with Dr. Tenny Craw to investigate her hypokalemia.    Labs Review Results for orders placed or performed during the hospital encounter of 07/11/14  CBC  Result Value Ref Range   WBC 12.4 (H) 4.0 - 10.5 K/uL   RBC 4.42 3.87 - 5.11 MIL/uL   Hemoglobin 12.9 12.0 - 15.0 g/dL   HCT 40.9 81.1 - 91.4 %   MCV 89.1 78.0 - 100.0 fL   MCH 29.2 26.0 - 34.0 pg   MCHC 32.7 30.0 - 36.0 g/dL   RDW 78.2 95.6 - 21.3 %   Platelets 305 150 - 400 K/uL  Basic  metabolic panel  Result Value Ref Range   Sodium 137 135 - 145 mmol/L   Potassium 3.0 (L) 3.5 - 5.1 mmol/L   Chloride 107 96 - 112 mmol/L   CO2 24 19 - 32 mmol/L   Glucose, Bld 103 (H) 70 - 99 mg/dL   BUN 13 6 - 23 mg/dL   Creatinine, Ser 0.86 0.50 - 1.10 mg/dL   Calcium 8.7 8.4 - 57.8 mg/dL   GFR calc non Af Amer >90 >90 mL/min   GFR calc Af Amer >90 >90 mL/min   Anion gap 6 5 - 15  Urinalysis, Routine w reflex microscopic  Result Value Ref Range   Color, Urine YELLOW YELLOW  APPearance CLEAR CLEAR   Specific Gravity, Urine 1.010 1.005 - 1.030   pH 7.0 5.0 - 8.0   Glucose, UA NEGATIVE NEGATIVE mg/dL   Hgb urine dipstick NEGATIVE NEGATIVE   Bilirubin Urine NEGATIVE NEGATIVE   Ketones, ur NEGATIVE NEGATIVE mg/dL   Protein, ur NEGATIVE NEGATIVE mg/dL   Urobilinogen, UA 0.2 0.0 - 1.0 mg/dL   Nitrite NEGATIVE NEGATIVE   Leukocytes, UA NEGATIVE NEGATIVE  Pregnancy, urine  Result Value Ref Range   Preg Test, Ur NEGATIVE NEGATIVE  Lipase, blood  Result Value Ref Range   Lipase 29 11 - 59 U/L  Hepatic function panel  Result Value Ref Range   Total Protein 8.3 6.0 - 8.3 g/dL   Albumin 4.4 3.5 - 5.2 g/dL   AST 16 0 - 37 U/L   ALT 18 0 - 35 U/L   Alkaline Phosphatase 82 39 - 117 U/L   Total Bilirubin 0.5 0.3 - 1.2 mg/dL   Bilirubin, Direct 0.1 0.0 - 0.5 mg/dL   Indirect Bilirubin 0.4 0.3 - 0.9 mg/dL  I-stat troponin, ED (not at [facility])  Result Value Ref Range   Troponin i, poc 0.00 0.00 - 0.08 ng/mL   Comment 3            Laboratory interpretation all normal except leukocytosis, hypokalemia    Imaging Review Dg Chest Port 1 View  07/11/2014   CLINICAL DATA:  Acute onset of generalized chest pain, dizziness and nausea. Initial encounter.  EXAM: PORTABLE CHEST - 1 VIEW  COMPARISON:  Chest radiograph performed 05/09/2014  FINDINGS: The lungs are well-aerated. Mild vascular congestion is noted. There is no evidence of focal opacification, pleural effusion or pneumothorax.  The  cardiomediastinal silhouette is within normal limits. No acute osseous abnormalities are seen.  IMPRESSION: Mild vascular congestion noted; lungs otherwise clear.   Electronically Signed   By: Roanna Raider M.D.   On: 07/11/2014 22:23     EKG Interpretation   Date/Time:  Sunday July 11 2014 21:26:48 EST Ventricular Rate:  102 PR Interval:  116 QRS Duration: 92 QT Interval:  332 QTC Calculation: 432 R Axis:   21 Text Interpretation:  Sinus tachycardia Possible Left atrial enlargement  Cannot rule out Anterior infarct , age undetermined No significant change  since last tracing 09 May 2014 Confirmed by [facility]  MD-I, Ysabel Cowgill ([id number]) on  07/11/2014 11:04:50 PM      MDM   Final diagnoses:  Chest pain, unspecified chest pain type  Abdominal pain, unspecified abdominal location  Hypokalemia  Tachycardia   New Prescriptions   POTASSIUM CHLORIDE SA (K-DUR,KLOR-CON) 20 MEQ TABLET    Take 1 tablet (20 mEq total) by mouth 2 (two) times daily.    Plan discharge  Devoria Albe, MD, Franz Dell, MD 07/12/14 [phone]

## 2014-07-12 NOTE — Discharge Instructions (Signed)
Try to stop drinking caffeinated drinks. They will keep you dehydrated and also increase your heart rate. Try to drink more fluids like water or sports drinks. Take the potassium pills until gone. Discuss your abdominal pain with your primary care doctor. Let Dr Charlott Rakesoss's office know about your ED visit and that your potassium level was low.     Hypokalemia Hypokalemia means that the amount of potassium in the blood is lower than normal.Potassium is a chemical, called an electrolyte, that helps regulate the amount of fluid in the body. It also stimulates muscle contraction and helps nerves function properly.Most of the body's potassium is inside of cells, and only a very small amount is in the blood. Because the amount in the blood is so small, minor changes can be life-threatening. CAUSES  Antibiotics.  Diarrhea or vomiting.  Using laxatives too much, which can cause diarrhea.  Chronic kidney disease.  Water pills (diuretics).  Eating disorders (bulimia).  Low magnesium level.  Sweating a lot. SIGNS AND SYMPTOMS  Weakness.  Constipation.  Fatigue.  Muscle cramps.  Mental confusion.  Skipped heartbeats or irregular heartbeat (palpitations).  Tingling or numbness. DIAGNOSIS  Your health care provider can diagnose hypokalemia with blood tests. In addition to checking your potassium level, your health care provider may also check other lab tests. TREATMENT Hypokalemia can be treated with potassium supplements taken by mouth or adjustments in your current medicines. If your potassium level is very low, you may need to get potassium through a vein (IV) and be monitored in the hospital. A diet high in potassium is also helpful. Foods high in potassium are:  Nuts, such as peanuts and pistachios.  Seeds, such as sunflower seeds and pumpkin seeds.  Peas, lentils, and lima beans.  Whole grain and bran cereals and breads.  Fresh fruit and vegetables, such as apricots, avocado,  bananas, cantaloupe, kiwi, oranges, tomatoes, asparagus, and potatoes.  Orange and tomato juices.  Red meats.  Fruit yogurt. HOME CARE INSTRUCTIONS  Take all medicines as prescribed by your health care provider.  Maintain a healthy diet by including nutritious food, such as fruits, vegetables, nuts, whole grains, and lean meats.  If you are taking a laxative, be sure to follow the directions on the label. SEEK MEDICAL CARE IF:  Your weakness gets worse.  You feel your heart pounding or racing.  You are vomiting or having diarrhea.  You are diabetic and having trouble keeping your blood glucose in the normal range. SEEK IMMEDIATE MEDICAL CARE IF:  You have chest pain, shortness of breath, or dizziness.  You are vomiting or having diarrhea for more than 2 days.  You faint. MAKE SURE YOU:   Understand these instructions.  Will watch your condition.  Will get help right away if you are not doing well or get worse. Document Released: 05/28/2005 Document Revised: 03/18/2013 Document Reviewed: 11/28/2012 [facility] Patient Information 2015 [employer] [place]. This information is not intended to replace advice given to you by your health care provider. Make sure you discuss any questions you have with your health care provider.

## 2014-07-13 ENCOUNTER — Other Ambulatory Visit: Payer: Self-pay

## 2014-07-21 ENCOUNTER — Encounter: Payer: Self-pay | Admitting: Internal Medicine

## 2014-08-23 ENCOUNTER — Encounter: Payer: Self-pay | Admitting: Internal Medicine

## 2014-08-23 ENCOUNTER — Ambulatory Visit (INDEPENDENT_AMBULATORY_CARE_PROVIDER_SITE_OTHER): Payer: 59 | Admitting: Internal Medicine

## 2014-08-23 VITALS — BP 120/70 | HR 98 | Ht 61.0 in | Wt 206.4 lb

## 2014-08-23 DIAGNOSIS — G90A Postural orthostatic tachycardia syndrome (POTS): Secondary | ICD-10-CM

## 2014-08-23 DIAGNOSIS — I951 Orthostatic hypotension: Secondary | ICD-10-CM

## 2014-08-23 DIAGNOSIS — R000 Tachycardia, unspecified: Secondary | ICD-10-CM

## 2014-08-23 LAB — BASIC METABOLIC PANEL
BUN: 18 mg/dL (ref 6–23)
CO2: 26 mEq/L (ref 19–32)
CREATININE: 0.71 mg/dL (ref 0.40–1.20)
Calcium: 9.5 mg/dL (ref 8.4–10.5)
Chloride: 104 mEq/L (ref 96–112)
GFR: 102.53 mL/min (ref >60.00)
Glucose, Bld: 87 mg/dL (ref 70–99)
Potassium: 4.1 mEq/L (ref 3.5–5.1)
Sodium: 136 mEq/L (ref 135–145)

## 2014-08-23 NOTE — Patient Instructions (Signed)
Your physician recommends that you continue on your current medications as directed. Please refer to the Current Medication list given to you today. Your physician recommends that you return for lab work in: today [employer](BMET)  Your physician wants you to follow-up in: October, 2016 WITH DR. Tenny CrawOSS.  You will receive a reminder letter in the mail two months in advance. If you don't receive a letter, please call our office to schedule the follow-up appointment.

## 2014-08-23 NOTE — Progress Notes (Signed)
Cardiology Office Note   Date:  08/23/2014   ID:  [name] [name], DOB January 16, 1986, MRN [id number]  PCP:  PROVIDER NOT IN SYSTEM  Cardiologist:   Dietrich Pates, MD   Patient presents for continued follow up of POTS      History of Present Illness: [name] [name] is a 29 y.o. female with a history of autonomic dysfunction  She was last in clinic to see me in 2014  She saw Leda Gauze in 2015 She has done fairly well  Does good with occasional bad days of dizziness, headache, weakness  She is not sure what triggers thise Wt has gone up   Was seen in ER in Jan with CP and palpitations./heart racing  Again, no clear trigger   K was low at time  Was placed on KCL Given IV fluids with improvement in symptoms.    Consitpated.   Mad about increased wt. Not too active        Current Outpatient Prescriptions  Medication Sig Dispense Refill  . clonazePAM (KLONOPIN) 0.5 MG tablet Take 0.5 mg by mouth 2 (two) times daily as needed for anxiety.     . metoprolol tartrate (LOPRESSOR) 25 MG tablet Take 0.5 tablets (12.5 mg total) by mouth 2 (two) times daily. 30 tablet 5  . potassium chloride SA (K-DUR,KLOR-CON) 20 MEQ tablet Take 1 tablet (20 mEq total) by mouth 2 (two) times daily. 8 tablet 0  . sertraline (ZOLOFT) 50 MG tablet Take 50 mg by mouth daily.     . [DISCONTINUED] gabapentin (NEURONTIN) 100 MG capsule Take 100 mg by mouth at bedtime.       No current facility-administered medications for this visit.    Allergies:   Vancomycin   Past Medical History  Diagnosis Date  . Dysautonomia   . SVT (supraventricular tachycardia) 2004    S/P slow path modification  . POTS (postural orthostatic tachycardia syndrome)     Past Surgical History  Procedure Laterality Date  . Cesarean section    . Ventricular ablation surgery      For supraventricular tachycardia     Social History:  The patient  reports that she has never smoked. She does not have any smokeless tobacco history on file.  She reports that she does not drink alcohol or use illicit drugs.   Family History:  The patient's family history includes Depression in her other; Diabetes in her mother and other; Heart disease in her mother; Heart failure in her father and mother; Hypertension in her mother and other.    ROS:  Please see the history of present illness. All other systems are reviewed and  Negative to the above problem except as noted.    PHYSICAL EXAM: VS:  BP 120/70 mmHg  Pulse 98  Ht  (1.549 m)  Wt 206 lb 6.4 oz (93.622 kg)  BMI 39.02 kg/m2  GEN: Well nourished, well developed, in no acute distress HEENT: normal Neck: no JVD, carotid bruits, or masses Cardiac: RRR; no murmurs, rubs, or gallops,no edema  Respiratory:  clear to auscultation bilaterally, normal work of breathing GI: soft, nontender, nondistended, + BS  No hepatomegaly  MS: no deformity Moving all extremities   Skin: warm and dry, no rash Neuro:  Strength and sensation are intact Psych: euthymic mood, full affect   EKG:  EKG is not  ordered today.   Lipid Panel    Component Value Date/Time   CHOL 151 08/08/2012 0934   TRIG 101.0 08/08/2012 0934  HDL 45.70 08/08/2012 0934   CHOLHDL 3 08/08/2012 0934   VLDL 20.2 08/08/2012 0934   LDLCALC 85 08/08/2012 0934      Wt Readings from Last 3 Encounters:  08/23/14 206 lb 6.4 oz (93.622 kg)  05/09/14 198 lb (89.812 kg)  02/24/14 202 lb 12.8 oz (91.989 kg)      ASSESSMENT AND PLAN:  1.  Autonomic dysfunction   Patient appears to be doing fairly well  She has good days, not many bad days   She is not orthostatic on exam  I have told her if she has a bad day wit palpitations can take 1/2 Klonopin She should increase her physcial activity  This would help Keep hydrated  2.  Hx hypokalemia  Will set up for BMET today     Current medicines are reviewed at length with the patient today.  The patient does not have concerns regarding medicines.  The following changes  have been made: As above    Labs/ tests ordered today include:  BMET No orders of the defined types were placed in this encounter.     Disposition:   FU with me in fall/winter  Signed, Dietrich PatesPaula Jane Broughton, MD  08/23/2014 11:56 AM    [facility] [address], [place], [place]  [address] Phone: [phone]; Fax: [phone]

## 2014-08-27 ENCOUNTER — Telehealth: Payer: Self-pay | Admitting: Internal Medicine

## 2014-08-27 NOTE — Telephone Encounter (Signed)
New problem   Pt is dizzy, lightheaded and feels like she is going to pass out. Pt want to speak to nurse and be seen today.

## 2014-08-27 NOTE — Telephone Encounter (Signed)
Left message for patient to call back on Monday also stated that I will try her back on Monday.

## 2014-08-30 NOTE — Telephone Encounter (Signed)
Called patient. Got voicemail, left message to call back if she is still feeling bad or needs anything.

## 2014-09-11 ENCOUNTER — Emergency Department (HOSPITAL_COMMUNITY)
Admission: EM | Admit: 2014-09-11 | Discharge: 2014-09-11 | Disposition: A | Discharge status: Home / Self Care | Payer: 59 | Attending: Emergency Medicine | Admitting: Emergency Medicine

## 2014-09-11 ENCOUNTER — Encounter (HOSPITAL_COMMUNITY): Payer: Self-pay | Admitting: Emergency Medicine

## 2014-09-11 DIAGNOSIS — L509 Urticaria, unspecified: Secondary | ICD-10-CM | POA: Insufficient documentation

## 2014-09-11 DIAGNOSIS — Z79899 Other long term (current) drug therapy: Secondary | ICD-10-CM | POA: Insufficient documentation

## 2014-09-11 DIAGNOSIS — Z8679 Personal history of other diseases of the circulatory system: Secondary | ICD-10-CM | POA: Insufficient documentation

## 2014-09-11 DIAGNOSIS — G909 Disorder of the autonomic nervous system, unspecified: Secondary | ICD-10-CM | POA: Diagnosis not present

## 2014-09-11 DIAGNOSIS — R21 Rash and other nonspecific skin eruption: Secondary | ICD-10-CM | POA: Diagnosis present

## 2014-09-11 MED ORDER — EPINEPHRINE 0.3 MG/0.3ML IJ SOAJ: 0.3000 mg | Freq: Once | INTRAMUSCULAR | Status: DC

## 2014-09-11 MED ORDER — PREDNISONE 50 MG PO TABS
60.0000 mg | ORAL_TABLET | Freq: Once | ORAL | Status: AC
  Administered 2014-09-11: 60 mg via ORAL
  Filled 2014-09-11 (×2): qty 1

## 2014-09-11 MED ORDER — DIPHENHYDRAMINE HCL 25 MG PO CAPS
50.0000 mg | ORAL_CAPSULE | Freq: Once | ORAL | Status: AC
  Administered 2014-09-11: 50 mg via ORAL
  Filled 2014-09-11: qty 2

## 2014-09-11 MED ORDER — PREDNISONE 50 MG PO TABS
ORAL_TABLET | ORAL | Status: DC
Start: 2014-09-11 — End: 2014-09-18

## 2014-09-11 NOTE — ED Provider Notes (Signed)
CSN: [id number]     Arrival date & time 09/11/14  0125 History   First MD Initiated Contact with Patient 09/11/14 0132     Chief Complaint  Patient presents with  . Urticaria    Patient is a 29 y.o. female presenting with urticaria. The history is provided by the patient.  Urticaria This is a new problem. The current episode started 2 days ago. The problem occurs daily. The problem has been gradually worsening. Pertinent negatives include no chest pain, no abdominal pain and no shortness of breath. Nothing aggravates the symptoms. Nothing relieves the symptoms.  pt reports hives to her body/legs for past 2 days No sob No diarrhea/vomiting/syncope No difficulty breathing/swallowing She thinks it is due to "black mold" in her house.    Past Medical History  Diagnosis Date  . Dysautonomia   . SVT (supraventricular tachycardia) 2004    S/P slow path modification  . POTS (postural orthostatic tachycardia syndrome)    Past Surgical History  Procedure Laterality Date  . Cesarean section    . Ventricular ablation surgery      For supraventricular tachycardia  . Tubal ligation     Family History  Problem Relation Age of Onset  . Hypertension Other   . Diabetes Other   . Depression Other   . Heart failure Mother   . Heart disease Mother   . Hypertension Mother   . Diabetes Mother   . Heart failure Father    History  Substance Use Topics  . Smoking status: Never Smoker   . Smokeless tobacco: Not on file  . Alcohol Use: No     Comment: Denies   OB History    Gravida Para Term Preterm AB TAB SAB Ectopic Multiple Living   Review of Systems  Constitutional: Negative for fever.  Respiratory: Negative for shortness of breath.   Cardiovascular: Negative for chest pain.  Gastrointestinal: Negative for abdominal pain and diarrhea.  Skin: Positive for rash.  Allergic/Immunologic: Positive for environmental allergies.  Neurological: Negative for syncope.  All  other systems reviewed and are negative.     Allergies  Vancomycin  Home Medications   Prior to Admission medications   Medication Sig Start Date End Date Taking? Authorizing Provider  clonazePAM (KLONOPIN) 0.5 MG tablet Take 0.5 mg by mouth 2 (two) times daily as needed for anxiety.  02/19/14  Yes Historical Provider, MD  metoprolol tartrate (LOPRESSOR) 25 MG tablet Take 0.5 tablets (12.5 mg total) by mouth 2 (two) times daily. 02/24/14  Yes Dyann Kief, PA-C  sertraline (ZOLOFT) 50 MG tablet Take 50 mg by mouth daily.  02/19/14  Yes Historical Provider, MD  EPINEPHrine (EPIPEN 2-PAK) 0.3 mg/0.3 mL IJ SOAJ injection Inject 0.3 mLs (0.3 mg total) into the muscle once. 09/11/14   [name], MD  predniSONE (DELTASONE) 50 MG tablet One tablet PO daily for 4 days 09/11/14   [name], MD   BP 113/73 mmHg  Pulse 99  Temp(Src) 97.6 F (36.4 C) (Oral)  Resp 20  Ht  (1.6 m)  Wt 203 lb (92.08 kg)  BMI 35.97 kg/m2  SpO2 100%  LMP 08/19/2014 Physical Exam CONSTITUTIONAL: Well developed/well nourished HEAD: Normocephalic/atraumatic EYES: EOMI/PERRL ENMT: Mucous membranes moist, no angioedema to tongue/lips NECK: supple no meningeal signs SPINE/BACK:entire spine nontender CV: S1/S2 noted, no murmurs/rubs/gallops noted LUNGS: Lungs are clear to auscultation bilaterally, no apparent distress, no wheeze ABDOMEN:  soft, nontender, no rebound or guarding, bowel sounds noted throughout abdomen GU:no cva tenderness NEURO: Pt is awake/alert/appropriate, moves all extremitiesx4.  No facial droop.   EXTREMITIES: pulses normal/equal, full ROM SKIN: warm, color normal, urticaria noted to abdomen/legs.   PSYCH: no abnormalities of mood noted, alert and oriented to situation   ED Course  Procedures   MDM   Final diagnoses:  Urticaria    Nursing notes including past medical history and social history reviewed and considered in documentation  Discussed appropriate use of epipen  if symptoms worsen with difficulty breathing/swallowing, facial swelling or syncope     Zadie Rhineonald Shontay Wallner, MD 09/11/14 0201

## 2014-09-11 NOTE — ED Notes (Signed)
Patient complaining of rash and itching on and off for approximately two days. States she has recently had allergy testing and tested positive for mold. States she has found mold around water heater at home. States has felt nauseous at home. Denies shortness of breath or any airway swelling or swelling in mouth. No acute distress noted.

## 2014-09-17 ENCOUNTER — Encounter (HOSPITAL_COMMUNITY): Payer: Self-pay | Admitting: *Deleted

## 2014-09-17 ENCOUNTER — Emergency Department (HOSPITAL_COMMUNITY)
Admission: EM | Admit: 2014-09-17 | Discharge: 2014-09-18 | Disposition: A | Discharge status: Home / Self Care | Payer: 59 | Attending: Emergency Medicine | Admitting: Emergency Medicine

## 2014-09-17 DIAGNOSIS — R110 Nausea: Secondary | ICD-10-CM | POA: Insufficient documentation

## 2014-09-17 DIAGNOSIS — L509 Urticaria, unspecified: Secondary | ICD-10-CM | POA: Insufficient documentation

## 2014-09-17 DIAGNOSIS — Z8679 Personal history of other diseases of the circulatory system: Secondary | ICD-10-CM | POA: Insufficient documentation

## 2014-09-17 DIAGNOSIS — Z8669 Personal history of other diseases of the nervous system and sense organs: Secondary | ICD-10-CM | POA: Diagnosis not present

## 2014-09-17 DIAGNOSIS — Z79899 Other long term (current) drug therapy: Secondary | ICD-10-CM | POA: Insufficient documentation

## 2014-09-17 DIAGNOSIS — R197 Diarrhea, unspecified: Secondary | ICD-10-CM | POA: Insufficient documentation

## 2014-09-17 DIAGNOSIS — Z3202 Encounter for pregnancy test, result negative: Secondary | ICD-10-CM | POA: Insufficient documentation

## 2014-09-17 DIAGNOSIS — R1084 Generalized abdominal pain: Secondary | ICD-10-CM | POA: Diagnosis present

## 2014-09-17 NOTE — ED Notes (Signed)
The pt  Is c/o a generalized rash for 2 weeks and she also has bad pain  With nv and diarrhea.  lmp now

## 2014-09-17 NOTE — ED Notes (Signed)
[facility]  And she reports that they spent all of 15 minutes with her.  4-2

## 2014-09-18 ENCOUNTER — Encounter (HOSPITAL_COMMUNITY): Payer: Self-pay | Admitting: Emergency Medicine

## 2014-09-18 ENCOUNTER — Emergency Department (HOSPITAL_COMMUNITY)
Admission: EM | Admit: 2014-09-18 | Discharge: 2014-09-18 | Disposition: A | Discharge status: Home / Self Care | Payer: 59 | Attending: Emergency Medicine | Admitting: Emergency Medicine

## 2014-09-18 ENCOUNTER — Emergency Department (HOSPITAL_COMMUNITY): Payer: 59

## 2014-09-18 DIAGNOSIS — R000 Tachycardia, unspecified: Secondary | ICD-10-CM | POA: Diagnosis not present

## 2014-09-18 DIAGNOSIS — F419 Anxiety disorder, unspecified: Secondary | ICD-10-CM | POA: Insufficient documentation

## 2014-09-18 DIAGNOSIS — Z79899 Other long term (current) drug therapy: Secondary | ICD-10-CM | POA: Insufficient documentation

## 2014-09-18 DIAGNOSIS — Z7952 Long term (current) use of systemic steroids: Secondary | ICD-10-CM | POA: Diagnosis not present

## 2014-09-18 DIAGNOSIS — R197 Diarrhea, unspecified: Secondary | ICD-10-CM | POA: Diagnosis not present

## 2014-09-18 DIAGNOSIS — R1013 Epigastric pain: Secondary | ICD-10-CM | POA: Diagnosis not present

## 2014-09-18 DIAGNOSIS — T7840XA Allergy, unspecified, initial encounter: Secondary | ICD-10-CM | POA: Diagnosis present

## 2014-09-18 HISTORY — DX: Anxiety disorder, unspecified: F41.9

## 2014-09-18 HISTORY — DX: Panic disorder (episodic paroxysmal anxiety): F41.0

## 2014-09-18 LAB — URINALYSIS, ROUTINE W REFLEX MICROSCOPIC
BILIRUBIN URINE: NEGATIVE
BILIRUBIN URINE: NEGATIVE
GLUCOSE, UA: NEGATIVE mg/dL
KETONES UR: NEGATIVE mg/dL
Ketones, ur: NEGATIVE mg/dL
LEUKOCYTES UA: NEGATIVE
Leukocytes, UA: NEGATIVE
Nitrite: NEGATIVE
Nitrite: NEGATIVE
PROTEIN: NEGATIVE mg/dL
Protein, ur: NEGATIVE mg/dL
SPECIFIC GRAVITY, URINE: 1.008 (ref 1.005–1.030)
SPECIFIC GRAVITY, URINE: 1.012 (ref 1.005–1.030)
Urobilinogen, UA: 0.2 mg/dL (ref 0.0–1.0)
Urobilinogen, UA: 0.2 mg/dL (ref 0.0–1.0)
pH: 5.5 (ref 5.0–8.0)
pH: 6.5 (ref 5.0–8.0)

## 2014-09-18 LAB — CBC WITH DIFFERENTIAL/PLATELET
BASOS ABS: 0 10*3/uL (ref 0.0–0.1)
Basophils Absolute: 0 10*3/uL (ref 0.0–0.1)
Basophils Relative: 0 % (ref 0–1)
Basophils Relative: 0 % (ref 0–1)
Eosinophils Absolute: 0 10*3/uL (ref 0.0–0.7)
Eosinophils Absolute: 0.2 10*3/uL (ref 0.0–0.7)
Eosinophils Relative: 0 % (ref 0–5)
Eosinophils Relative: 2 % (ref 0–5)
HCT: 38.5 % (ref 36.0–46.0)
HEMATOCRIT: 40.5 % (ref 36.0–46.0)
Hemoglobin: 12.7 g/dL (ref 12.0–15.0)
Hemoglobin: 13.1 g/dL (ref 12.0–15.0)
LYMPHS ABS: 1.1 10*3/uL (ref 0.7–4.0)
LYMPHS PCT: 38 % (ref 12–46)
Lymphocytes Relative: 8 % — ABNORMAL LOW (ref 12–46)
Lymphs Abs: 3.9 10*3/uL (ref 0.7–4.0)
MCH: 28.9 pg (ref 26.0–34.0)
MCH: 29.1 pg (ref 26.0–34.0)
MCHC: 32.3 g/dL (ref 30.0–36.0)
MCHC: 33 g/dL (ref 30.0–36.0)
MCV: 88.1 fL (ref 78.0–100.0)
MCV: 89.4 fL (ref 78.0–100.0)
MONOS PCT: 5 % (ref 3–12)
Monocytes Absolute: 0.2 10*3/uL (ref 0.1–1.0)
Monocytes Absolute: 0.5 10*3/uL (ref 0.1–1.0)
Monocytes Relative: 1 % — ABNORMAL LOW (ref 3–12)
Neutro Abs: 12.5 10*3/uL — ABNORMAL HIGH (ref 1.7–7.7)
Neutro Abs: 5.7 10*3/uL (ref 1.7–7.7)
Neutrophils Relative %: 55 % (ref 43–77)
Neutrophils Relative %: 91 % — ABNORMAL HIGH (ref 43–77)
PLATELETS: 319 10*3/uL (ref 150–400)
PLATELETS: 338 10*3/uL (ref 150–400)
RBC: 4.37 MIL/uL (ref 3.87–5.11)
RBC: 4.53 MIL/uL (ref 3.87–5.11)
RDW: 13.5 % (ref 11.5–15.5)
RDW: 13.5 % (ref 11.5–15.5)
WBC: 10.3 10*3/uL (ref 4.0–10.5)
WBC: 13.8 10*3/uL — ABNORMAL HIGH (ref 4.0–10.5)

## 2014-09-18 LAB — COMPREHENSIVE METABOLIC PANEL
ALT: 22 U/L (ref 0–35)
ALT: 25 U/L (ref 0–35)
AST: 25 U/L (ref 0–37)
AST: 36 U/L (ref 0–37)
Albumin: 3.7 g/dL (ref 3.5–5.2)
Albumin: 3.8 g/dL (ref 3.5–5.2)
Alkaline Phosphatase: 71 U/L (ref 39–117)
Alkaline Phosphatase: 73 U/L (ref 39–117)
Anion gap: 8 (ref 5–15)
Anion gap: 8 (ref 5–15)
BILIRUBIN TOTAL: 0.3 mg/dL (ref 0.3–1.2)
BUN: 11 mg/dL (ref 6–23)
BUN: 6 mg/dL (ref 6–23)
CALCIUM: 8.8 mg/dL (ref 8.4–10.5)
CO2: 22 mmol/L (ref 19–32)
CO2: 25 mmol/L (ref 19–32)
CREATININE: 0.79 mg/dL (ref 0.50–1.10)
Calcium: 8.8 mg/dL (ref 8.4–10.5)
Chloride: 106 mmol/L (ref 96–112)
Chloride: 108 mmol/L (ref 96–112)
Creatinine, Ser: 0.73 mg/dL (ref 0.50–1.10)
GFR calc Af Amer: >90 mL/min (ref >90)
GFR calc non Af Amer: >90 mL/min (ref >90)
GFR calc non Af Amer: >90 mL/min (ref >90)
Glucose, Bld: 140 mg/dL — ABNORMAL HIGH (ref 70–99)
Glucose, Bld: 151 mg/dL — ABNORMAL HIGH (ref 70–99)
Potassium: 3.8 mmol/L (ref 3.5–5.1)
Potassium: 4.2 mmol/L (ref 3.5–5.1)
Sodium: 138 mmol/L (ref 135–145)
Sodium: 139 mmol/L (ref 135–145)
TOTAL PROTEIN: 6.6 g/dL (ref 6.0–8.3)
TOTAL PROTEIN: 7.5 g/dL (ref 6.0–8.3)
Total Bilirubin: 1.1 mg/dL (ref 0.3–1.2)

## 2014-09-18 LAB — URINE MICROSCOPIC-ADD ON

## 2014-09-18 LAB — I-STAT CG4 LACTIC ACID, ED: Lactic Acid, Venous: 2.88 mmol/L (ref 0.5–2.0)

## 2014-09-18 LAB — POC URINE PREG, ED: Preg Test, Ur: NEGATIVE

## 2014-09-18 LAB — LIPASE, BLOOD: Lipase: 34 U/L (ref 11–59)

## 2014-09-18 MED ORDER — METRONIDAZOLE 500 MG PO TABS: 500.0000 mg | ORAL_TABLET | Freq: Two times a day (BID) | ORAL | Status: DC

## 2014-09-18 MED ORDER — FAMOTIDINE IN NACL 20-0.9 MG/50ML-% IV SOLN
20.0000 mg | Freq: Once | INTRAVENOUS | Status: AC
  Administered 2014-09-18: 20 mg via INTRAVENOUS
  Filled 2014-09-18: qty 50

## 2014-09-18 MED ORDER — METRONIDAZOLE 500 MG PO TABS
500.0000 mg | ORAL_TABLET | Freq: Once | ORAL | Status: AC
  Administered 2014-09-18: 500 mg via ORAL
  Filled 2014-09-18: qty 1

## 2014-09-18 MED ORDER — LORAZEPAM 2 MG/ML IJ SOLN
0.5000 mg | Freq: Once | INTRAMUSCULAR | Status: AC
  Administered 2014-09-18: 0.5 mg via INTRAVENOUS
  Filled 2014-09-18: qty 1

## 2014-09-18 MED ORDER — METHYLPREDNISOLONE SODIUM SUCC 125 MG IJ SOLR
125.0000 mg | Freq: Once | INTRAMUSCULAR | Status: AC
  Administered 2014-09-18: 125 mg via INTRAVENOUS
  Filled 2014-09-18: qty 2

## 2014-09-18 MED ORDER — SODIUM CHLORIDE 0.9 % IV BOLUS (SEPSIS)
1000.0000 mL | Freq: Once | INTRAVENOUS | Status: AC
  Administered 2014-09-18: 1000 mL via INTRAVENOUS

## 2014-09-18 MED ORDER — LORAZEPAM 2 MG/ML IJ SOLN
1.0000 mg | Freq: Once | INTRAMUSCULAR | Status: AC
  Administered 2014-09-18: 1 mg via INTRAVENOUS
  Filled 2014-09-18: qty 1

## 2014-09-18 MED ORDER — FAMOTIDINE 20 MG PO TABS: 20.0000 mg | ORAL_TABLET | Freq: Two times a day (BID) | ORAL | Status: DC

## 2014-09-18 MED ORDER — LORAZEPAM 1 MG PO TABS: 1.0000 mg | ORAL_TABLET | Freq: Three times a day (TID) | ORAL | Status: DC | PRN

## 2014-09-18 MED ORDER — EPINEPHRINE 0.3 MG/0.3ML IJ SOAJ: 0.3000 mg | Freq: Once | INTRAMUSCULAR | Status: DC

## 2014-09-18 MED ORDER — PREDNISONE 10 MG PO TABS: ORAL_TABLET | ORAL | Status: DC

## 2014-09-18 MED ORDER — HYDROXYZINE HCL 25 MG PO TABS: 25.0000 mg | ORAL_TABLET | Freq: Four times a day (QID) | ORAL | Status: DC

## 2014-09-18 NOTE — Discharge Instructions (Signed)
TAKE IMMODIUM IF HAVING GREATER THAN 5 BOWEL MOVEMENTS DAILY. RETURN HERE IF SYMPTOMS WORSEN OR FOR NEW CONCERN.  Food Choices to Help Relieve Diarrhea When you have diarrhea, the foods you eat and your eating habits are very important. Choosing the right foods and drinks can help relieve diarrhea. Also, because diarrhea can last up to 7 days, you need to replace lost fluids and electrolytes (such as sodium, potassium, and chloride) in order to help prevent dehydration.  WHAT GENERAL GUIDELINES DO I NEED TO FOLLOW?  Slowly drink 1 cup (8 oz) of fluid for each episode of diarrhea. If you are getting enough fluid, your urine will be clear or pale yellow.  Eat starchy foods. Some good choices include white rice, white toast, pasta, low-fiber cereal, baked potatoes (without the skin), saltine crackers, and bagels.  Avoid large servings of any cooked vegetables.  Limit fruit to two servings per day. A serving is  cup or 1 small piece.  Choose foods with less than 2 g of fiber per serving.  Limit fats to less than 8 tsp (38 g) per day.  Avoid fried foods.  Eat foods that have probiotics in them. Probiotics can be found in certain dairy products.  Avoid foods and beverages that may increase the speed at which food moves through the stomach and intestines (gastrointestinal tract). Things to avoid include:  High-fiber foods, such as dried fruit, raw fruits and vegetables, nuts, seeds, and whole grain foods.  Spicy foods and high-fat foods.  Foods and beverages sweetened with high-fructose corn syrup, honey, or sugar alcohols such as xylitol, sorbitol, and mannitol. WHAT FOODS ARE RECOMMENDED? Grains White rice. White, [place], or pita breads (fresh or toasted), including plain rolls, buns, or bagels. White pasta. Saltine, soda, or graham crackers. Pretzels. Low-fiber cereal. Cooked cereals made with water (such as cornmeal, farina, or cream cereals). Plain muffins. Matzo. Melba toast. Zwieback.   Vegetables Potatoes (without the skin). Strained tomato and vegetable juices. Most well-cooked and canned vegetables without seeds. Tender lettuce. Fruits Cooked or canned applesauce, apricots, cherries, fruit cocktail, grapefruit, peaches, pears, or plums. Fresh bananas, apples without skin, cherries, grapes, cantaloupe, grapefruit, peaches, oranges, or plums.  Meat and Other Protein Products Baked or boiled chicken. Eggs. Tofu. Fish. Seafood. Smooth peanut butter. Ground or well-cooked tender beef, ham, veal, lamb, pork, or poultry.  Dairy Plain yogurt, kefir, and unsweetened liquid yogurt. Lactose-free milk, buttermilk, or soy milk. Plain hard cheese. Beverages Sport drinks. Clear broths. Diluted fruit juices (except prune). Regular, caffeine-free sodas such as ginger ale. Water. Decaffeinated teas. Oral rehydration solutions. Sugar-free beverages not sweetened with sugar alcohols. Other Bouillon, broth, or soups made from recommended foods.  The items listed above may not be a complete list of recommended foods or beverages. Contact your dietitian for more options. WHAT FOODS ARE NOT RECOMMENDED? Grains Whole grain, whole wheat, bran, or rye breads, rolls, pastas, crackers, and cereals. Wild or brown rice. Cereals that contain more than 2 g of fiber per serving. Corn tortillas or taco shells. Cooked or dry oatmeal. Granola. Popcorn. Vegetables Raw vegetables. Cabbage, broccoli, Brussels sprouts, artichokes, baked beans, beet greens, corn, kale, legumes, peas, sweet potatoes, and yams. Potato skins. Cooked spinach and cabbage. Fruits Dried fruit, including raisins and dates. Raw fruits. Stewed or dried prunes. Fresh apples with skin, apricots, mangoes, pears, raspberries, and strawberries.  Meat and Other Protein Products Chunky peanut butter. Nuts and seeds. Beans and lentils. [name] [name].  Dairy High-fat cheeses. Milk, chocolate milk, and beverages  made with milk, such as milk shakes. Cream.  Ice cream. Sweets and Desserts Sweet rolls, doughnuts, and sweet breads. Pancakes and waffles. Fats and Oils Butter. Cream sauces. Margarine. Salad oils. Plain salad dressings. Olives. Avocados.  Beverages Caffeinated beverages (such as coffee, tea, soda, or energy drinks). Alcoholic beverages. Fruit juices with pulp. Prune juice. Soft drinks sweetened with high-fructose corn syrup or sugar alcohols. Other Coconut. Hot sauce. Chili powder. Mayonnaise. Gravy. Cream-based or milk-based soups.  The items listed above may not be a complete list of foods and beverages to avoid. Contact your dietitian for more information. WHAT SHOULD I DO IF I BECOME DEHYDRATED? Diarrhea can sometimes lead to dehydration. Signs of dehydration include dark urine and dry mouth and skin. If you think you are dehydrated, you should rehydrate with an oral rehydration solution. These solutions can be purchased at pharmacies, retail stores, or online.  Drink -1 cup (120-240 mL) of oral rehydration solution each time you have an episode of diarrhea. If drinking this amount makes your diarrhea worse, try drinking smaller amounts more often. For example, drink 1-3 tsp (5-15 mL) every 5-10 minutes.  A general rule for staying hydrated is to drink 1-2 L of fluid per day. Talk to your health care provider about the specific amount you should be drinking each day. Drink enough fluids to keep your urine clear or pale yellow. Document Released: 08/18/2003 Document Revised: 06/02/2013 Document Reviewed: 04/20/2013 [facility] Patient Information 2015 [place], [place]. This information is not intended to replace advice given to you by your health care provider. Make sure you discuss any questions you have with your health care provider.  Hives Hives are itchy, red, swollen areas of the skin. They can vary in size and location on your body. Hives can come and go for hours or several days (acute hives) or for several weeks (chronic hives).  Hives do not spread from person to person (noncontagious). They may get worse with scratching, exercise, and emotional stress. CAUSES   Allergic reaction to food, additives, or drugs.  Infections, including the common cold.  Illness, such as vasculitis, lupus, or thyroid disease.  Exposure to sunlight, heat, or cold.  Exercise.  Stress.  Contact with chemicals. SYMPTOMS   Red or white swollen patches on the skin. The patches may change size, shape, and location quickly and repeatedly.  Itching.  Swelling of the hands, feet, and face. This may occur if hives develop deeper in the skin. DIAGNOSIS  Your caregiver can usually tell what is wrong by performing a physical exam. Skin or blood tests may also be done to determine the cause of your hives. In some cases, the cause cannot be determined. TREATMENT  Mild cases usually get better with medicines such as antihistamines. Severe cases may require an emergency epinephrine injection. If the cause of your hives is known, treatment includes avoiding that trigger.  HOME CARE INSTRUCTIONS   Avoid causes that trigger your hives.  Take antihistamines as directed by your caregiver to reduce the severity of your hives. Non-sedating or low-sedating antihistamines are usually recommended. Do not drive while taking an antihistamine.  Take any other medicines prescribed for itching as directed by your caregiver.  Wear loose-fitting clothing.  Keep all follow-up appointments as directed by your caregiver. SEEK MEDICAL CARE IF:   You have persistent or severe itching that is not relieved with medicine.  You have painful or swollen joints. SEEK IMMEDIATE MEDICAL CARE IF:   You have a fever.  Your tongue  or lips are swollen.  You have trouble breathing or swallowing.  You feel tightness in the throat or chest.  You have abdominal pain. These problems may be the first sign of a life-threatening allergic reaction. Call your local  emergency services (911 in [place] MAKE SURE YOU:   Understand these instructions.  Will watch your condition.  Will get help right away if you are not doing well or get worse. Document Released: 05/28/2005 Document Revised: 06/02/2013 Document Reviewed: 08/21/2011 [facility] Patient Information 2015 [place]. This information is not intended to replace advice given to you by your health care provider. Make sure you discuss any questions you have with your health care provider.

## 2014-09-18 NOTE — ED Notes (Signed)
Attempt blood draw, unsuccessful. Notified phlebotomy.

## 2014-09-18 NOTE — ED Notes (Signed)
Pt reports transient itching of mouth and lips, not present at this time.

## 2014-09-18 NOTE — ED Provider Notes (Signed)
CSN: [id number]     Arrival date & time 09/18/14  1141 History   First MD Initiated Contact with Patient 09/18/14 1142     Chief Complaint  Patient presents with  . Allergic Reaction     (Consider location/radiation/quality/duration/timing/severity/associated sxs/prior Treatment) HPI Patient presents 11 hours after recent being evaluated here, now with continued concern for diarrhea, cutaneous changes about the abdomen. Patient states that since discharge she has not filled any of prescription, continues to feel anxiety about the persistent diarrhea, abdominal cutaneous changes, left arm tingling. All symptoms were present yesterday, have persisted since that evaluation. No new syncope, chest pain, dyspnea. No new fever, chills. No new vomiting. Patient has not taken any new medication.  Past Medical History  Diagnosis Date  . Dysautonomia   . SVT (supraventricular tachycardia) 2004    S/P slow path modification  . POTS (postural orthostatic tachycardia syndrome)   . Anxiety   . Panic attacks    Past Surgical History  Procedure Laterality Date  . Cesarean section    . Ventricular ablation surgery      For supraventricular tachycardia  . Tubal ligation     Family History  Problem Relation Age of Onset  . Hypertension Other   . Diabetes Other   . Depression Other   . Heart failure Mother   . Heart disease Mother   . Hypertension Mother   . Diabetes Mother   . Heart failure Father    History  Substance Use Topics  . Smoking status: Never Smoker   . Smokeless tobacco: Not on file  . Alcohol Use: No     Comment: Denies   OB History    Gravida Para Term Preterm AB TAB SAB Ectopic Multiple Living   Review of Systems  Constitutional:       Per HPI, otherwise negative  HENT:       Per HPI, otherwise negative  Respiratory:       Per HPI, otherwise negative  Cardiovascular:       Per HPI, otherwise negative  Gastrointestinal: Positive for nausea  and diarrhea. Negative for vomiting.  Endocrine:       Negative aside from HPI  Genitourinary:       Neg aside from HPI   Musculoskeletal:       Per HPI, otherwise negative  Skin: Positive for color change and rash.  Neurological: Negative for syncope.      Allergies  Vancomycin  Home Medications   Prior to Admission medications   Medication Sig Start Date End Date Taking? Authorizing Provider  clonazePAM (KLONOPIN) 0.5 MG tablet Take 0.5 mg by mouth 2 (two) times daily as needed for anxiety.  02/19/14   Historical Provider, MD  diphenhydrAMINE (BENADRYL) 25 mg capsule Take 25 mg by mouth every 6 (six) hours as needed for itching.    Historical Provider, MD  EPINEPHrine (EPIPEN 2-PAK) 0.3 mg/0.3 mL IJ SOAJ injection Inject 0.3 mLs (0.3 mg total) into the muscle once. 09/18/14   Elpidio Anis, PA-C  famotidine (PEPCID) 20 MG tablet Take 1 tablet (20 mg total) by mouth 2 (two) times daily. 09/18/14   Elpidio Anis, PA-C  hydrOXYzine (ATARAX/VISTARIL) 25 MG tablet Take 1 tablet (25 mg total) by mouth every 6 (six) hours. 09/18/14   Elpidio Anis, PA-C  LORazepam (ATIVAN) 1 MG tablet Take 1 tablet (1 mg total) by mouth 3 (three) times daily as  needed (ITCHING). 09/18/14   Elpidio Anis, PA-C  metoprolol tartrate (LOPRESSOR) 25 MG tablet Take 0.5 tablets (12.5 mg total) by mouth 2 (two) times daily. 02/24/14   Dyann Kief, PA-C  predniSONE (DELTASONE) 10 MG tablet Take 6 tablets on days 1 and 2 Take 5 tablets on days 3 and 4 Take 4 tablets on days 5 and 6 Take 3 tablets on days 7 and 8 Take 2 tablets on days 9 and 10 Take 1 tablet on days 11 and 12 09/18/14   Elpidio Anis, PA-C  sertraline (ZOLOFT) 50 MG tablet Take 50 mg by mouth daily.  02/19/14   Historical Provider, MD   BP 123/68 mmHg  Pulse 134  Temp(Src) 97.6 F (36.4 C) (Oral)  Resp 14  Ht  (1.549 m)  Wt 213 lb (96.616 kg)  BMI 40.27 kg/m2  SpO2 100%  LMP 09/17/2014 Physical Exam  Constitutional: She is oriented to  person, place, and time. She appears well-developed and well-nourished. No distress.  HENT:  Head: Normocephalic and atraumatic.  Eyes: Conjunctivae and EOM are normal.  Cardiovascular: Regular rhythm.  Tachycardia present.   Pulmonary/Chest: Effort normal and breath sounds normal. No stridor. No respiratory distress.  Abdominal: She exhibits no distension. There is tenderness in the epigastric area.    Musculoskeletal: She exhibits no edema.  Neurological: She is alert and oriented to person, place, and time. No cranial nerve deficit.  Skin: Skin is warm and dry.  Superficial changes consistent with urticaria on the upper abdomen.  Psychiatric: Her mood appears anxious.  Nursing note and vitals reviewed.   ED Course  Procedures (including critical care time) Labs Review Labs Reviewed  COMPREHENSIVE METABOLIC PANEL - Abnormal; Notable for the following:    Glucose, Bld 151 (*)    All other components within normal limits  CBC WITH DIFFERENTIAL/PLATELET - Abnormal; Notable for the following:    WBC 13.8 (*)    Neutrophils Relative % 91 (*)    Neutro Abs 12.5 (*)    Lymphocytes Relative 8 (*)    Monocytes Relative 1 (*)    All other components within normal limits  URINALYSIS, ROUTINE W REFLEX MICROSCOPIC - Abnormal; Notable for the following:    APPearance CLOUDY (*)    Glucose, UA >1000 (*)    Hgb urine dipstick LARGE (*)    All other components within normal limits  I-STAT CG4 LACTIC ACID, ED - Abnormal; Notable for the following:    Lactic Acid, Venous 2.88 (*)    All other components within normal limits  URINE MICROSCOPIC-ADD ON    Imaging Review Dg Chest 2 View  09/18/2014   CLINICAL DATA:  Seen less 9 for allergic reaction. This morning patient woke with left-sided chest pain. Blood sugar of 304  EXAM: CHEST  2 VIEW  COMPARISON:  07/11/2014  FINDINGS: The heart size and mediastinal contours are within normal limits. Both lungs are clear. The visualized skeletal  structures are unremarkable.  IMPRESSION: No active cardiopulmonary disease.   Electronically Signed   By: Signa Kell M.D.   On: 09/18/2014 13:08    Patient requested chest x-ray.  After the initial evaluation I reviewed the patient's chart, including her visit earlier today. I also reviewed her d/c paperwork and prescriptions, which have not been filled thus far.   3:54 PM Patient mildly tachycardic.  No new complaints.  She is aware of all results, including ketonuria, concern for possible dehydration. Given her description of ongoing pain in  the left upper quadrant, left lateral side, colitis is a consideration.   MDM  Patient presents with concern of ongoing diarrhea, left lateral abdominal pain, generalized complaints. Here the patient is afebrile, awake, alert, in no distress. Patient's cutaneous lesions to her ongoing diarrhea, though there might be some medication interactions. No evidence for acute abdominal processes, though colitis is a possibility given her history, the tachycardia, persistent diarrhea. Patient improved with IV fluids here, was discharged in stable condition with a course of Flagyl, to follow-up with GI.     Gerhard Munchobert Aryonna Gunnerson, MD 09/18/14 1556

## 2014-09-18 NOTE — ED Provider Notes (Signed)
CSN: [id number]     Arrival date & time 09/17/14  2332 History   First MD Initiated Contact with Patient 09/18/14 0001     Chief Complaint  Patient presents with  . Abdominal Pain     (Consider location/radiation/quality/duration/timing/severity/associated sxs/prior Treatment) Patient is a 29 y.o. female presenting with abdominal pain. The history is provided by the patient. No language interpreter was used.  Abdominal Pain Pain location:  Generalized Associated symptoms: diarrhea and nausea   Associated symptoms: no chills, no fever, no shortness of breath and no vomiting   Associated symptoms comment:  She presents to [facility] ED with complaint of diffuse abdominal cramping and diarrhea, approximately 8 episodes daily for the past 2-3 days. Nausea without vomiting. No fever. She also continues to have an urticarial rash seen and treated on 09/11/14 in [facility] ED and by her primary care physician. Despite use of steroids (3-day taper) and benadryl, she continues to have a rash with significant itching. No SOB, difficulty swallowing.    Past Medical History  Diagnosis Date  . Dysautonomia   . SVT (supraventricular tachycardia) 2004    S/P slow path modification  . POTS (postural orthostatic tachycardia syndrome)    Past Surgical History  Procedure Laterality Date  . Cesarean section    . Ventricular ablation surgery      For supraventricular tachycardia  . Tubal ligation     Family History  Problem Relation Age of Onset  . Hypertension Other   . Diabetes Other   . Depression Other   . Heart failure Mother   . Heart disease Mother   . Hypertension Mother   . Diabetes Mother   . Heart failure Father    History  Substance Use Topics  . Smoking status: Never Smoker   . Smokeless tobacco: Not on file  . Alcohol Use: No     Comment: Denies   OB History    Gravida Para Term Preterm AB TAB SAB Ectopic Multiple Living   Review of Systems  Constitutional:  Negative for fever and chills.  HENT: Negative.  Negative for trouble swallowing.   Respiratory: Negative.  Negative for chest tightness and shortness of breath.   Cardiovascular: Negative.   Gastrointestinal: Positive for nausea, abdominal pain and diarrhea. Negative for vomiting.  Musculoskeletal: Negative.  Negative for myalgias.  Skin: Positive for rash.  Neurological: Negative.       Allergies  Vancomycin  Home Medications   Prior to Admission medications   Medication Sig Start Date End Date Taking? Authorizing Provider  clonazePAM (KLONOPIN) 0.5 MG tablet Take 0.5 mg by mouth 2 (two) times daily as needed for anxiety.  02/19/14  Yes Historical Provider, MD  diphenhydrAMINE (BENADRYL) 25 mg capsule Take 25 mg by mouth every 6 (six) hours as needed for itching.   Yes Historical Provider, MD  metoprolol tartrate (LOPRESSOR) 25 MG tablet Take 0.5 tablets (12.5 mg total) by mouth 2 (two) times daily. 02/24/14  Yes Dyann Kief, PA-C  sertraline (ZOLOFT) 50 MG tablet Take 50 mg by mouth daily.  02/19/14  Yes Historical Provider, MD  EPINEPHrine (EPIPEN 2-PAK) 0.3 mg/0.3 mL IJ SOAJ injection Inject 0.3 mLs (0.3 mg total) into the muscle once. Patient not taking: Reported on 09/18/2014 09/11/14   [name], MD  predniSONE (DELTASONE) 50 MG tablet One tablet PO daily for 4 days Patient not taking: Reported on 09/18/2014 09/11/14  Zadie Rhineonald Wickline, MD   BP 114/77 mmHg  Pulse 85  Temp(Src) 97.6 F (36.4 C) (Oral)  Resp 16  Wt 213 lb (96.616 kg)  SpO2 97%  LMP 09/17/2014 Physical Exam  Constitutional: She is oriented to person, place, and time. She appears well-developed and well-nourished.  HENT:  Head: Normocephalic.  Neck: Normal range of motion. Neck supple.  Cardiovascular: Normal rate and regular rhythm.   Pulmonary/Chest: Effort normal and breath sounds normal.  Abdominal: Soft. Bowel sounds are normal. There is no tenderness. There is no rebound and no guarding.   Musculoskeletal: Normal range of motion.  Neurological: She is alert and oriented to person, place, and time.  Skin: Skin is warm and dry. No rash noted.  Raised, erythematous welts over abdomen, back and upper extremities c/w hives.   Psychiatric: She has a normal mood and affect.    ED Course  Procedures (including critical care time) Labs Review Labs Reviewed  COMPREHENSIVE METABOLIC PANEL - Abnormal; Notable for the following:    Glucose, Bld 140 (*)    All other components within normal limits  URINALYSIS, ROUTINE W REFLEX MICROSCOPIC - Abnormal; Notable for the following:    Hgb urine dipstick LARGE (*)    All other components within normal limits  CBC WITH DIFFERENTIAL/PLATELET  LIPASE, BLOOD  URINE MICROSCOPIC-ADD ON  POC URINE PREG, ED    Imaging Review No results found.   EKG Interpretation None      MDM   Final diagnoses:  None    1. Urticaria 2. Diarrhea  Recommended Immodium for diarrhea. VSS no fever. Diffuse mild abdominal tenderness. Doubt acute process.   Ativan, solumedrol, pepcid provided for itching (she took Benadryl 2 hours PTA)  with minimal relief.  Will change benadryl to atarax, continue steroids and pepcid, refer to allergist.     Elpidio AnisShari Alee Gressman, PA-C 09/18/14 0201  Dione Boozeavid Glick, MD 09/18/14 [phone]

## 2014-09-18 NOTE — Discharge Instructions (Signed)
As discussed, it is important that you follow up with your physicians for continued management of your condition.  These be sure to discuss your elevated sugar readings.  Your diarrhea and abdominal pain may be due to colitis, though it is important that you follow-up with our gastroenterologist.   If you develop any new, or concerning changes in your condition, please return to the emergency department immediately.

## 2014-09-18 NOTE — ED Notes (Signed)
Patient transported to X-ray 

## 2014-09-18 NOTE — ED Notes (Signed)
EDP at bedside  

## 2014-09-18 NOTE — ED Notes (Signed)
Per EMS, pt comes from home with c/o allergic reaction along with dull chest pain, numbness & tingling left arm. Pt seen last night, d/c at 3am this morning. Pt reports blurred vision. Pt has h/o SVT. CBG 304. Pt not a diabetic.

## 2014-09-18 NOTE — ED Notes (Signed)
Notified phlebotomy for lab draw

## 2014-12-02 ENCOUNTER — Telehealth: Payer: Self-pay | Admitting: Internal Medicine

## 2014-12-02 NOTE — Telephone Encounter (Signed)
Patient left papers for Dr. Tenny Craw to fill out.  Also has question about medicines she is taking for her allergies.   Need to make sure they are note effecting her other meds.

## 2014-12-03 ENCOUNTER — Telehealth: Payer: Self-pay | Admitting: Internal Medicine

## 2014-12-03 NOTE — Telephone Encounter (Signed)
allergies to shellfish, lips mouth tingling, and soy, grasses, pollen She has seen allergist--was prescribed benadryl, atarax, zyrtec, zantac, at 2-3 times per day.   She is concerned that taking all this medication will affect her heart rate.  It is usually 90s-120s.  Lately it has been mostly in the 70s.     Rec to not take any over the counter allergy medication with decongestant in it. She is aware that I am forwarding to Dr. Tenny Craw to review.    Advised will call her back if there are any new recommendations.   Verbalizes understanding.   DMV papers have been sent to [facility] according to medical records, patient does not know the reason papers need filled out.  If they are not signed "they're gonna take my license". Will call patient when papers are complete.

## 2014-12-03 NOTE — Telephone Encounter (Signed)
Received [place] Department [place] [employer] [place] [employer] form to be completed sent to [employer].  12/03/14  ltd

## 2014-12-16 ENCOUNTER — Other Ambulatory Visit: Payer: Self-pay | Admitting: Physician Assistant

## 2014-12-16 DIAGNOSIS — R1032 Left lower quadrant pain: Secondary | ICD-10-CM

## 2014-12-21 ENCOUNTER — Ambulatory Visit
Admission: RE | Admit: 2014-12-21 | Discharge: 2014-12-21 | Disposition: A | Discharge status: Home / Self Care | Payer: 59 | Source: Ambulatory Visit | Attending: Physician Assistant | Admitting: Physician Assistant

## 2014-12-21 DIAGNOSIS — R1032 Left lower quadrant pain: Secondary | ICD-10-CM

## 2015-01-04 ENCOUNTER — Telehealth: Payer: Self-pay | Admitting: Internal Medicine

## 2015-01-04 NOTE — Telephone Encounter (Signed)
Department of Transportation paper completed by Dr.Ross patient called for pick up.

## 2015-01-12 ENCOUNTER — Emergency Department (HOSPITAL_COMMUNITY)
Admission: EM | Admit: 2015-01-12 | Discharge: 2015-01-12 | Disposition: A | Discharge status: Home / Self Care | Payer: 59 | Attending: Emergency Medicine | Admitting: Emergency Medicine

## 2015-01-12 ENCOUNTER — Encounter (HOSPITAL_COMMUNITY): Payer: Self-pay | Admitting: *Deleted

## 2015-01-12 DIAGNOSIS — Z8669 Personal history of other diseases of the nervous system and sense organs: Secondary | ICD-10-CM | POA: Insufficient documentation

## 2015-01-12 DIAGNOSIS — Y9289 Other specified places as the place of occurrence of the external cause: Secondary | ICD-10-CM | POA: Insufficient documentation

## 2015-01-12 DIAGNOSIS — Z79899 Other long term (current) drug therapy: Secondary | ICD-10-CM | POA: Diagnosis not present

## 2015-01-12 DIAGNOSIS — F410 Panic disorder [episodic paroxysmal anxiety] without agoraphobia: Secondary | ICD-10-CM | POA: Insufficient documentation

## 2015-01-12 DIAGNOSIS — Y9389 Activity, other specified: Secondary | ICD-10-CM | POA: Insufficient documentation

## 2015-01-12 DIAGNOSIS — X58XXXA Exposure to other specified factors, initial encounter: Secondary | ICD-10-CM | POA: Insufficient documentation

## 2015-01-12 DIAGNOSIS — T169XXA Foreign body in ear, unspecified ear, initial encounter: Secondary | ICD-10-CM | POA: Diagnosis present

## 2015-01-12 DIAGNOSIS — T162XXA Foreign body in left ear, initial encounter: Secondary | ICD-10-CM | POA: Diagnosis not present

## 2015-01-12 DIAGNOSIS — Y998 Other external cause status: Secondary | ICD-10-CM | POA: Diagnosis not present

## 2015-01-12 DIAGNOSIS — Z862 Personal history of diseases of the blood and blood-forming organs and certain disorders involving the immune mechanism: Secondary | ICD-10-CM | POA: Diagnosis not present

## 2015-01-12 NOTE — Discharge Instructions (Signed)
Ear Foreign Body °An ear foreign body is an object that is stuck in the ear. It is common for young children to put objects into the ear canal. These may include pebbles, beads, beans, and any other small objects which will fit. In adults, objects such as cotton swabs may become lodged in the ear canal. In all ages, the most common foreign bodies are insects that enter the ear canal.  °SYMPTOMS  °Foreign bodies may cause pain, buzzing or roaring sounds, hearing loss, and ear drainage.  °HOME CARE INSTRUCTIONS  °· Keep all follow-up appointments with your caregiver as told. °· Keep small objects out of reach of young children. Tell them not to put anything in their ears. °SEEK IMMEDIATE MEDICAL CARE IF:  °· You have bleeding from the ear. °· You have increased pain or swelling of the ear. °· You have reduced hearing. °· You have discharge coming from the ear. °· You have a fever. °· You have a headache. °MAKE SURE YOU:  °· Understand these instructions. °· Will watch your condition. °· Will get help right away if you are not doing well or get worse. °Document Released: 05/25/2000 Document Revised: 08/20/2011 Document Reviewed: 01/14/2008 °ExitCare® Patient Information ©2015 [employer]. This information is not intended to replace advice given to you by your health care provider. Make sure you discuss any questions you have with your health care provider. ° °

## 2015-01-12 NOTE — ED Notes (Signed)
Pt c/o ? Fly in left ear that started about 30 minutes ago,

## 2015-01-14 NOTE — ED Provider Notes (Signed)
CSN: [id number]     Arrival date & time 01/12/15  2245 History   First MD Initiated Contact with Patient 01/12/15 2310     Chief Complaint  Patient presents with  . Foreign Body in Ear     (Consider location/radiation/quality/duration/timing/severity/associated sxs/prior Treatment) Patient is a 29 y.o. female presenting with foreign body in ear. The history is provided by the patient and the spouse.  Foreign Body in Ear This is a new problem. Episode onset: Pt believes a fly flew in her ear about 30 minutes ago.  Husband applied water in the ear and she reports it stopped moving but has not flushed out. The problem occurs constantly. The problem has been unchanged. Pertinent negatives include no congestion or fever. Associated symptoms comments: Denies ear pain, drainage, decreased hearing acuity. . Nothing aggravates the symptoms. The treatment provided no relief.    Past Medical History  Diagnosis Date  . Dysautonomia   . SVT (supraventricular tachycardia) 2004    S/P slow path modification  . POTS (postural orthostatic tachycardia syndrome)   . Anxiety   . Panic attacks    Past Surgical History  Procedure Laterality Date  . Cesarean section    . Ventricular ablation surgery      For supraventricular tachycardia  . Tubal ligation     Family History  Problem Relation Age of Onset  . Hypertension Other   . Diabetes Other   . Depression Other   . Heart failure Mother   . Heart disease Mother   . Hypertension Mother   . Diabetes Mother   . Heart failure Father    History  Substance Use Topics  . Smoking status: Never Smoker   . Smokeless tobacco: Not on file  . Alcohol Use: No     Comment: Denies   OB History    Gravida Para Term Preterm AB TAB SAB Ectopic Multiple Living   4 4 4             Review of Systems  Constitutional: Negative.  Negative for fever.  HENT: Negative for congestion, ear discharge, ear pain and hearing loss.   Neurological: Negative for  dizziness and light-headedness.      Allergies  Shellfish allergy; Soy allergy; and Vancomycin  Home Medications   Prior to Admission medications   Medication Sig Start Date End Date Taking? Authorizing Provider  clonazePAM (KLONOPIN) 0.5 MG tablet Take 0.5 mg by mouth 2 (two) times daily.  02/19/14   Historical Provider, MD  diphenhydrAMINE (BENADRYL) 25 mg capsule Take 25 mg by mouth every 6 (six) hours as needed for itching.    Historical Provider, MD  EPINEPHrine (EPIPEN 2-PAK) 0.3 mg/0.3 mL IJ SOAJ injection Inject 0.3 mLs (0.3 mg total) into the muscle once. 09/18/14   Elpidio Anis, PA-C  famotidine (PEPCID) 20 MG tablet Take 1 tablet (20 mg total) by mouth 2 (two) times daily. 09/18/14   Elpidio Anis, PA-C  hydrOXYzine (ATARAX/VISTARIL) 25 MG tablet Take 1 tablet (25 mg total) by mouth every 6 (six) hours. Patient not taking: Reported on 09/18/2014 09/18/14   Elpidio Anis, PA-C  LORazepam (ATIVAN) 1 MG tablet Take 1 tablet (1 mg total) by mouth 3 (three) times daily as needed (ITCHING). 09/18/14   Elpidio Anis, PA-C  metoprolol tartrate (LOPRESSOR) 25 MG tablet Take 0.5 tablets (12.5 mg total) by mouth 2 (two) times daily. 02/24/14   Dyann Kief, PA-C  metroNIDAZOLE (FLAGYL) 500 MG tablet Take 1 tablet (500 mg total) by  mouth 2 (two) times daily. 09/18/14   Gerhard Munch, MD  predniSONE (DELTASONE) 10 MG tablet Take 6 tablets on days 1 and 2 Take 5 tablets on days 3 and 4 Take 4 tablets on days 5 and 6 Take 3 tablets on days 7 and 8 Take 2 tablets on days 9 and 10 Take 1 tablet on days 11 and 12 09/18/14   Elpidio Anis, PA-C  sertraline (ZOLOFT) 50 MG tablet Take 50 mg by mouth daily.  02/19/14   Historical Provider, MD   BP 130/80 mmHg  Pulse 104  Temp(Src) 98.1 F (36.7 C) (Oral)  Resp 20  Ht  (1.549 m)  Wt 210 lb (95.255 kg)  BMI 39.70 kg/m2  SpO2 100%  LMP 01/03/2015 Physical Exam  Constitutional: She is oriented to person, place, and time. She appears well-developed  and well-nourished.  HENT:  Head: Normocephalic.  Right Ear: Hearing, tympanic membrane and ear canal normal.  Left Ear: Hearing and tympanic membrane normal. A foreign body is present.  Small black dead fly distal ear canal  Eyes: Conjunctivae are normal.  Neurological: She is alert and oriented to person, place, and time.  Skin: Skin is warm and dry.  Psychiatric: She has a normal mood and affect.    ED Course  FOREIGN BODY REMOVAL Date/Time: 01/12/2015 11:15 PM Performed by: Burgess Amor Authorized by: Burgess Amor Patient identity confirmed: verbally with patient Time out: Immediately prior to procedure a "time out" was called to verify the correct patient, procedure, equipment, support staff and site/side marked as required. Body area: ear Location details: left ear Localization method: ENT speculum Removal mechanism: alligator forceps Complexity: simple 1 objects recovered. Objects recovered: fly Post-procedure assessment: foreign body removed Patient tolerance: Patient tolerated the procedure well with no immediate complications Comments: Examined ear canal post extraction, no trauma, canal and TM  normal appearance.   (including critical care time) Labs Review Labs Reviewed - No data to display  Imaging Review No results found.   EKG Interpretation None      MDM   Final diagnoses:  Ear foreign body, left, initial encounter    Prn f/u anticipated.    Burgess Amor, PA-C 01/14/15 1404  Eber Hong, MD 01/14/15 [phone]

## 2015-03-28 ENCOUNTER — Emergency Department (HOSPITAL_COMMUNITY)
Admission: EM | Admit: 2015-03-28 | Discharge: 2015-03-28 | Disposition: A | Discharge status: Home / Self Care | Payer: 59 | Attending: Emergency Medicine | Admitting: Emergency Medicine

## 2015-03-28 ENCOUNTER — Encounter (HOSPITAL_COMMUNITY): Payer: Self-pay | Admitting: Emergency Medicine

## 2015-03-28 DIAGNOSIS — R1012 Left upper quadrant pain: Secondary | ICD-10-CM | POA: Insufficient documentation

## 2015-03-28 DIAGNOSIS — R300 Dysuria: Secondary | ICD-10-CM | POA: Insufficient documentation

## 2015-03-28 DIAGNOSIS — Z9851 Tubal ligation status: Secondary | ICD-10-CM | POA: Insufficient documentation

## 2015-03-28 DIAGNOSIS — R197 Diarrhea, unspecified: Secondary | ICD-10-CM | POA: Insufficient documentation

## 2015-03-28 DIAGNOSIS — R110 Nausea: Secondary | ICD-10-CM | POA: Insufficient documentation

## 2015-03-28 DIAGNOSIS — Z3202 Encounter for pregnancy test, result negative: Secondary | ICD-10-CM | POA: Insufficient documentation

## 2015-03-28 DIAGNOSIS — Z792 Long term (current) use of antibiotics: Secondary | ICD-10-CM | POA: Insufficient documentation

## 2015-03-28 DIAGNOSIS — Z8669 Personal history of other diseases of the nervous system and sense organs: Secondary | ICD-10-CM | POA: Insufficient documentation

## 2015-03-28 DIAGNOSIS — R51 Headache: Secondary | ICD-10-CM | POA: Insufficient documentation

## 2015-03-28 DIAGNOSIS — Z8679 Personal history of other diseases of the circulatory system: Secondary | ICD-10-CM | POA: Insufficient documentation

## 2015-03-28 DIAGNOSIS — R350 Frequency of micturition: Secondary | ICD-10-CM | POA: Insufficient documentation

## 2015-03-28 DIAGNOSIS — F419 Anxiety disorder, unspecified: Secondary | ICD-10-CM | POA: Insufficient documentation

## 2015-03-28 DIAGNOSIS — F410 Panic disorder [episodic paroxysmal anxiety] without agoraphobia: Secondary | ICD-10-CM | POA: Insufficient documentation

## 2015-03-28 DIAGNOSIS — Z79899 Other long term (current) drug therapy: Secondary | ICD-10-CM | POA: Insufficient documentation

## 2015-03-28 DIAGNOSIS — M545 Low back pain: Secondary | ICD-10-CM | POA: Insufficient documentation

## 2015-03-28 LAB — COMPREHENSIVE METABOLIC PANEL
ALBUMIN: 4.2 g/dL (ref 3.5–5.0)
ALK PHOS: 75 U/L (ref 38–126)
ALT: 30 U/L (ref 14–54)
AST: 27 U/L (ref 15–41)
Anion gap: 10 (ref 5–15)
BUN: 12 mg/dL (ref 6–20)
CALCIUM: 8.9 mg/dL (ref 8.9–10.3)
CO2: 26 mmol/L (ref 22–32)
Chloride: 104 mmol/L (ref 101–111)
Creatinine, Ser: 0.87 mg/dL (ref 0.44–1.00)
GFR calc Af Amer: >60 mL/min (ref >60)
GFR calc non Af Amer: >60 mL/min (ref >60)
GLUCOSE: 113 mg/dL — AB (ref 65–99)
Potassium: 3.1 mmol/L — ABNORMAL LOW (ref 3.5–5.1)
Sodium: 140 mmol/L (ref 135–145)
TOTAL PROTEIN: 7.4 g/dL (ref 6.5–8.1)
Total Bilirubin: 0.3 mg/dL (ref 0.3–1.2)

## 2015-03-28 LAB — URINALYSIS, ROUTINE W REFLEX MICROSCOPIC
BILIRUBIN URINE: NEGATIVE
GLUCOSE, UA: NEGATIVE mg/dL
Ketones, ur: NEGATIVE mg/dL
Leukocytes, UA: NEGATIVE
Nitrite: NEGATIVE
PROTEIN: NEGATIVE mg/dL
Specific Gravity, Urine: 1.013 (ref 1.005–1.030)
UROBILINOGEN UA: 0.2 mg/dL (ref 0.0–1.0)
pH: 7.5 (ref 5.0–8.0)

## 2015-03-28 LAB — URINE MICROSCOPIC-ADD ON

## 2015-03-28 LAB — POC URINE PREG, ED: PREG TEST UR: NEGATIVE

## 2015-03-28 LAB — CBC
HCT: 38.1 % (ref 36.0–46.0)
Hemoglobin: 12.6 g/dL (ref 12.0–15.0)
MCH: 29.3 pg (ref 26.0–34.0)
MCHC: 33.1 g/dL (ref 30.0–36.0)
MCV: 88.6 fL (ref 78.0–100.0)
Platelets: 342 10*3/uL (ref 150–400)
RBC: 4.3 MIL/uL (ref 3.87–5.11)
RDW: 13.2 % (ref 11.5–15.5)
WBC: 10.7 10*3/uL — ABNORMAL HIGH (ref 4.0–10.5)

## 2015-03-28 LAB — LIPASE, BLOOD: Lipase: 33 U/L (ref 22–51)

## 2015-03-28 NOTE — ED Notes (Signed)
Bladder scan 229 ml in her bladder

## 2015-03-28 NOTE — ED Provider Notes (Signed)
CSN: [id number]   Arrival date & time 03/28/15 0017  History  By signing my name below, I, [name] [name], attest that this documentation has been prepared under the direction and in the presence of [name], MD. Electronically Signed: Bethel [name], ED Scribe. 03/28/2015. 2:39 AM.  Chief Complaint  Patient presents with  . Dysuria  . Abdominal Pain     Patient is a 29 y.o. female presenting with female genitourinary complaint. The history is provided by the patient. No language interpreter was used.  Female GU Problem This is a new problem. The current episode started 2 days ago. The problem occurs constantly. The problem has not changed since onset.Associated symptoms include abdominal pain and headaches. Nothing aggravates the symptoms. Nothing relieves the symptoms. She has tried nothing for the symptoms.   [name] [name] is a 29 y.o. female who presents to the Emergency Department complaining of new increased urinary frequency with onset 2 days ago. She states that she frequently feels as if she needs to void but passes little urine.  Associated symptoms include upper abdominal pain, lower back pain, nausea, and diarrhea (yesterday). Also complains of headache. Pt denies dysuria, vomiting, abnormal vaginal discharge, LE weakness or numbness. She is currently menstruating. No history of HTN or DM.   Past Medical History  Diagnosis Date  . Dysautonomia   . SVT (supraventricular tachycardia) (HCC) 2004    S/P slow path modification  . POTS (postural orthostatic tachycardia syndrome)   . Anxiety   . Panic attacks     Past Surgical History  Procedure Laterality Date  . Cesarean section    . Ventricular ablation surgery      For supraventricular tachycardia  . Tubal ligation      Family History  Problem Relation Age of Onset  . Hypertension Other   . Diabetes Other   . Depression Other   . Heart failure Mother   . Heart disease Mother   . Hypertension Mother   .  Diabetes Mother   . Heart failure Father     Social History  Substance Use Topics  . Smoking status: Never Smoker   . Smokeless tobacco: None  . Alcohol Use: No     Comment: Denies     Review of Systems  Constitutional: Negative for fever and chills.  Gastrointestinal: Positive for nausea, abdominal pain and diarrhea. Negative for vomiting.  Genitourinary: Positive for frequency. Negative for dysuria, hematuria and vaginal discharge.  Musculoskeletal: Positive for back pain.  Neurological: Positive for headaches. Negative for weakness.  All other systems reviewed and are negative.  Home Medications   Prior to Admission medications   Medication Sig Start Date End Date Taking? Authorizing Provider  clonazePAM (KLONOPIN) 0.5 MG tablet Take 0.5 mg by mouth 2 (two) times daily.  02/19/14   Historical Provider, MD  diphenhydrAMINE (BENADRYL) 25 mg capsule Take 25 mg by mouth every 6 (six) hours as needed for itching.    Historical Provider, MD  EPINEPHrine (EPIPEN 2-PAK) 0.3 mg/0.3 mL IJ SOAJ injection Inject 0.3 mLs (0.3 mg total) into the muscle once. 09/18/14   Elpidio Anis, PA-C  famotidine (PEPCID) 20 MG tablet Take 1 tablet (20 mg total) by mouth 2 (two) times daily. 09/18/14   Elpidio Anis, PA-C  hydrOXYzine (ATARAX/VISTARIL) 25 MG tablet Take 1 tablet (25 mg total) by mouth every 6 (six) hours. Patient not taking: Reported on 09/18/2014 09/18/14   Elpidio Anis, PA-C  LORazepam (ATIVAN) 1 MG tablet Take 1 tablet (1  mg total) by mouth 3 (three) times daily as needed (ITCHING). 09/18/14   Elpidio AnisShari Upstill, PA-C  metoprolol tartrate (LOPRESSOR) 25 MG tablet Take 0.5 tablets (12.5 mg total) by mouth 2 (two) times daily. 02/24/14   Dyann KiefMichele M Lenze, PA-C  metroNIDAZOLE (FLAGYL) 500 MG tablet Take 1 tablet (500 mg total) by mouth 2 (two) times daily. 09/18/14   Gerhard Munchobert Lockwood, MD  predniSONE (DELTASONE) 10 MG tablet Take 6 tablets on days 1 and 2 Take 5 tablets on days 3 and 4 Take 4 tablets on days 5  and 6 Take 3 tablets on days 7 and 8 Take 2 tablets on days 9 and 10 Take 1 tablet on days 11 and 12 09/18/14   Elpidio AnisShari Upstill, PA-C  sertraline (ZOLOFT) 50 MG tablet Take 50 mg by mouth daily.  02/19/14   Historical Provider, MD    Allergies  Shellfish allergy; Soy allergy; and Vancomycin  Triage Vitals: BP 137/81 mmHg  Pulse 106  Temp(Src) 97.7 F (36.5 C) (Oral)  Resp 16  SpO2 96%  LMP 03/27/2015  Physical Exam CONSTITUTIONAL: Well developed/well nourished HEAD: Normocephalic/atraumatic EYES: EOMI/PERRL ENMT: Mucous membranes moist NECK: supple no meningeal signs SPINE/BACK:entire spine nontender CV: S1/S2 noted, no murmurs/rubs/gallops noted LUNGS: Lungs are clear to auscultation bilaterally, no apparent distress ABDOMEN: soft, nontender, no rebound or guarding, bowel sounds noted throughout abdomen GU:no cva tenderness NEURO: Pt is awake/alert/appropriate, moves all extremitiesx4.  No facial droop.   EXTREMITIES: pulses normal/equal, full ROM SKIN: warm, color normal PSYCH: no abnormalities of mood noted, alert and oriented to situation  ED Course  Procedures   DIAGNOSTIC STUDIES: Oxygen Saturation is 96% on RA, normal by my interpretation.    COORDINATION OF CARE: 1:17 AM Discussed treatment plan which includes lab work and bladder scan with pt at bedside and pt agreed to plan. Pt declined pain medication at the time of inial assessment.   Pt well appearing Abdominal exam unremarkable Aside from hematuria (likely due to menstrual bleeding) no acute findings No signs of significant urinary retention She declines pain meds After further discussion, pt would like to go home and f/u with PCP Will not proceed with further imaging/workup at  This time We discussed strict return precautions   Labs Reviewed  COMPREHENSIVE METABOLIC PANEL - Abnormal; Notable for the following:    Potassium 3.1 (*)    Glucose, Bld 113 (*)    All other components within normal limits   CBC - Abnormal; Notable for the following:    WBC 10.7 (*)    All other components within normal limits  URINALYSIS, ROUTINE W REFLEX MICROSCOPIC (NOT [facility]) - Abnormal; Notable for the following:    Hgb urine dipstick LARGE (*)    All other components within normal limits  URINE MICROSCOPIC-ADD ON - Abnormal; Notable for the following:    Squamous Epithelial / LPF FEW (*)    All other components within normal limits  LIPASE, BLOOD  POC URINE PREG, ED    I personally reviewed and evaluated these lab results as a part of my medical decision-making.    MDM   Final diagnoses:  Dysuria  Left upper quadrant pain     Nursing notes including past medical history and social history reviewed and considered in documentation Labs/vital reviewed myself and considered during evaluation   I, Joya GaskinsWICKLINE,Garik Diamant W, personally performed the services described in this documentation. All medical record entries made by the scribe were at my direction and in my presence.  I  have reviewed the chart and discharge instructions and agree that the record reflects my personal performance and is accurate and complete. Joya Gaskins.  03/28/2015. 2:42 AM.     [name], MD 03/28/15 [phone]

## 2015-03-28 NOTE — ED Notes (Signed)
diffiuclty urinating for 2 days. Headache lt abd and rt flank pain lmp now

## 2015-03-28 NOTE — Discharge Instructions (Signed)

## 2015-03-28 NOTE — ED Notes (Signed)
Reports difficulty urinating, nausea,  and LUQ pain x 2 days.  Also having lower abd pain but states LUQ is the worse.

## 2015-04-02 ENCOUNTER — Emergency Department (HOSPITAL_COMMUNITY)
Admission: EM | Admit: 2015-04-02 | Discharge: 2015-04-03 | Disposition: A | Discharge status: Home / Self Care | Payer: 59 | Attending: Emergency Medicine | Admitting: Emergency Medicine

## 2015-04-02 ENCOUNTER — Emergency Department (HOSPITAL_COMMUNITY): Payer: 59

## 2015-04-02 ENCOUNTER — Encounter (HOSPITAL_COMMUNITY): Payer: Self-pay | Admitting: Oncology

## 2015-04-02 DIAGNOSIS — Z79899 Other long term (current) drug therapy: Secondary | ICD-10-CM | POA: Insufficient documentation

## 2015-04-02 DIAGNOSIS — Z7952 Long term (current) use of systemic steroids: Secondary | ICD-10-CM | POA: Insufficient documentation

## 2015-04-02 DIAGNOSIS — R197 Diarrhea, unspecified: Secondary | ICD-10-CM | POA: Insufficient documentation

## 2015-04-02 DIAGNOSIS — R110 Nausea: Secondary | ICD-10-CM | POA: Insufficient documentation

## 2015-04-02 DIAGNOSIS — Z8679 Personal history of other diseases of the circulatory system: Secondary | ICD-10-CM | POA: Diagnosis not present

## 2015-04-02 DIAGNOSIS — G909 Disorder of the autonomic nervous system, unspecified: Secondary | ICD-10-CM | POA: Insufficient documentation

## 2015-04-02 DIAGNOSIS — F410 Panic disorder [episodic paroxysmal anxiety] without agoraphobia: Secondary | ICD-10-CM | POA: Diagnosis not present

## 2015-04-02 DIAGNOSIS — Z792 Long term (current) use of antibiotics: Secondary | ICD-10-CM | POA: Insufficient documentation

## 2015-04-02 DIAGNOSIS — K921 Melena: Secondary | ICD-10-CM | POA: Insufficient documentation

## 2015-04-02 DIAGNOSIS — R1084 Generalized abdominal pain: Secondary | ICD-10-CM | POA: Insufficient documentation

## 2015-04-02 LAB — URINALYSIS, ROUTINE W REFLEX MICROSCOPIC
BILIRUBIN URINE: NEGATIVE
GLUCOSE, UA: NEGATIVE mg/dL
KETONES UR: NEGATIVE mg/dL
Leukocytes, UA: NEGATIVE
NITRITE: NEGATIVE
PH: 7 (ref 5.0–8.0)
Protein, ur: NEGATIVE mg/dL
SPECIFIC GRAVITY, URINE: 1.006 (ref 1.005–1.030)
Urobilinogen, UA: 0.2 mg/dL (ref 0.0–1.0)

## 2015-04-02 LAB — COMPREHENSIVE METABOLIC PANEL
ALBUMIN: 4.4 g/dL (ref 3.5–5.0)
ALT: 24 U/L (ref 14–54)
AST: 23 U/L (ref 15–41)
Alkaline Phosphatase: 86 U/L (ref 38–126)
Anion gap: 8 (ref 5–15)
BUN: 13 mg/dL (ref 6–20)
CHLORIDE: 104 mmol/L (ref 101–111)
CO2: 27 mmol/L (ref 22–32)
Calcium: 9.5 mg/dL (ref 8.9–10.3)
Creatinine, Ser: 0.81 mg/dL (ref 0.44–1.00)
GFR calc Af Amer: >60 mL/min (ref >60)
GFR calc non Af Amer: >60 mL/min (ref >60)
GLUCOSE: 102 mg/dL — AB (ref 65–99)
POTASSIUM: 3.9 mmol/L (ref 3.5–5.1)
Sodium: 139 mmol/L (ref 135–145)
Total Bilirubin: 0.5 mg/dL (ref 0.3–1.2)
Total Protein: 8.2 g/dL — ABNORMAL HIGH (ref 6.5–8.1)

## 2015-04-02 LAB — CBC
HEMATOCRIT: 41.7 % (ref 36.0–46.0)
Hemoglobin: 13.7 g/dL (ref 12.0–15.0)
MCH: 29.3 pg (ref 26.0–34.0)
MCHC: 32.9 g/dL (ref 30.0–36.0)
MCV: 89.3 fL (ref 78.0–100.0)
Platelets: 371 10*3/uL (ref 150–400)
RBC: 4.67 MIL/uL (ref 3.87–5.11)
RDW: 12.9 % (ref 11.5–15.5)
WBC: 11.4 10*3/uL — ABNORMAL HIGH (ref 4.0–10.5)

## 2015-04-02 LAB — URINE MICROSCOPIC-ADD ON

## 2015-04-02 LAB — LIPASE, BLOOD: LIPASE: 35 U/L (ref 11–51)

## 2015-04-02 MED ORDER — IOHEXOL 300 MG/ML  SOLN
25.0000 mL | Freq: Once | INTRAMUSCULAR | Status: AC | PRN
  Administered 2015-04-02: 25 mL via ORAL

## 2015-04-02 MED ORDER — IOHEXOL 300 MG/ML  SOLN
100.0000 mL | Freq: Once | INTRAMUSCULAR | Status: AC | PRN
  Administered 2015-04-02: 100 mL via INTRAVENOUS

## 2015-04-02 MED ORDER — DIPHENHYDRAMINE HCL 50 MG/ML IJ SOLN
25.0000 mg | Freq: Once | INTRAMUSCULAR | Status: AC
  Administered 2015-04-03: 25 mg via INTRAVENOUS
  Filled 2015-04-02: qty 1

## 2015-04-02 MED ORDER — ONDANSETRON 4 MG PO TBDP: 4.0000 mg | ORAL_TABLET | Freq: Three times a day (TID) | ORAL | Status: DC | PRN

## 2015-04-02 MED ORDER — SODIUM CHLORIDE 0.9 % IV BOLUS (SEPSIS)
1000.0000 mL | Freq: Once | INTRAVENOUS | Status: AC
  Administered 2015-04-02: 1000 mL via INTRAVENOUS

## 2015-04-02 NOTE — ED Provider Notes (Signed)
CSN: [id number]     Arrival date & time 04/02/15  1931 History   First MD Initiated Contact with Patient 04/02/15 2002     Chief Complaint  Patient presents with  . Abdominal Pain     (Consider location/radiation/quality/duration/timing/severity/associated sxs/prior Treatment) HPI Comments: Patient with no history of inflammatory bowel disease or abdominal surgeries other than tubal ligation -- presents with continued abdominal pain, nausea, and diarrhea. Patient was initially seen on 03/28/2015 in the emergency department where she had reassuring labs, normal urine. No imaging was performed. Patient states symptoms were improving however worsened today when she had worsening pain and began to pass mucous and blood in the stool. Patient denies any travel, camping, suspicious foods. No urinary sx today. Pain is generalized and does not radiate. Worse in epigastrium and left abdomen. The onset of this condition was acute. The course is recurrent. Aggravating factors: none. Alleviating factors: none.    Patient is a 29 y.o. female presenting with abdominal pain. The history is provided by the patient and medical records.  Abdominal Pain Associated symptoms: diarrhea and nausea   Associated symptoms: no chest pain, no constipation, no cough, no dysuria, no fever, no sore throat and no vomiting     Past Medical History  Diagnosis Date  . Dysautonomia   . SVT (supraventricular tachycardia) (HCC) 2004    S/P slow path modification  . POTS (postural orthostatic tachycardia syndrome)   . Anxiety   . Panic attacks    Past Surgical History  Procedure Laterality Date  . Cesarean section    . Ventricular ablation surgery      For supraventricular tachycardia  . Tubal ligation     Family History  Problem Relation Age of Onset  . Hypertension Other   . Diabetes Other   . Depression Other   . Heart failure Mother   . Heart disease Mother   . Hypertension Mother   . Diabetes Mother   .  Heart failure Father    Social History  Substance Use Topics  . Smoking status: Never Smoker   . Smokeless tobacco: Never Used  . Alcohol Use: No     Comment: Denies   OB History    Gravida Para Term Preterm AB TAB SAB Ectopic Multiple Living   Review of Systems  Constitutional: Negative for fever.  HENT: Negative for rhinorrhea and sore throat.   Eyes: Negative for redness.  Respiratory: Negative for cough.   Cardiovascular: Negative for chest pain.  Gastrointestinal: Positive for nausea, abdominal pain, diarrhea and blood in stool. Negative for vomiting and constipation.  Genitourinary: Negative for dysuria.  Musculoskeletal: Negative for myalgias.  Skin: Negative for rash.  Neurological: Negative for headaches.    Allergies  Shellfish allergy; Soy allergy; and Vancomycin  Home Medications   Prior to Admission medications   Medication Sig Start Date End Date Taking? Authorizing Provider  clonazePAM (KLONOPIN) 0.5 MG tablet Take 0.5 mg by mouth 2 (two) times daily.  02/19/14   Historical Provider, MD  diphenhydrAMINE (BENADRYL) 25 mg capsule Take 25 mg by mouth every 6 (six) hours as needed for itching.    Historical Provider, MD  EPINEPHrine (EPIPEN 2-PAK) 0.3 mg/0.3 mL IJ SOAJ injection Inject 0.3 mLs (0.3 mg total) into the muscle once. 09/18/14   Elpidio Anis, PA-C  famotidine (PEPCID) 20 MG tablet Take 1 tablet (20 mg total) by mouth 2 (two) times daily.  09/18/14   Elpidio Anis, PA-C  hydrOXYzine (ATARAX/VISTARIL) 25 MG tablet Take 1 tablet (25 mg total) by mouth every 6 (six) hours. Patient not taking: Reported on 09/18/2014 09/18/14   Elpidio Anis, PA-C  LORazepam (ATIVAN) 1 MG tablet Take 1 tablet (1 mg total) by mouth 3 (three) times daily as needed (ITCHING). 09/18/14   Elpidio Anis, PA-C  metoprolol tartrate (LOPRESSOR) 25 MG tablet Take 0.5 tablets (12.5 mg total) by mouth 2 (two) times daily. 02/24/14   Dyann Kief, PA-C  metroNIDAZOLE (FLAGYL) 500  MG tablet Take 1 tablet (500 mg total) by mouth 2 (two) times daily. 09/18/14   Gerhard Munch, MD  predniSONE (DELTASONE) 10 MG tablet Take 6 tablets on days 1 and 2 Take 5 tablets on days 3 and 4 Take 4 tablets on days 5 and 6 Take 3 tablets on days 7 and 8 Take 2 tablets on days 9 and 10 Take 1 tablet on days 11 and 12 09/18/14   Elpidio Anis, PA-C  sertraline (ZOLOFT) 50 MG tablet Take 50 mg by mouth daily.  02/19/14   Historical Provider, MD   BP 133/79 mmHg  Pulse 102  Temp(Src) 98.2 F (36.8 C) (Oral)  Resp 18  Ht  (1.549 m)  Wt 200 lb (90.719 kg)  BMI 37.81 kg/m2  SpO2 95%  LMP 03/27/2015   Physical Exam  Constitutional: She appears well-developed and well-nourished.  HENT:  Head: Normocephalic and atraumatic.  Mouth/Throat: Oropharynx is clear and moist.  Eyes: Conjunctivae are normal. Right eye exhibits no discharge. Left eye exhibits no discharge.  Neck: Normal range of motion. Neck supple.  Cardiovascular: Normal rate, regular rhythm and normal heart sounds.   No murmur heard. Pulmonary/Chest: Effort normal and breath sounds normal. No respiratory distress. She has no wheezes. She has no rales.  Abdominal: Soft. There is tenderness (mild, generalized). There is no rebound and no guarding.  Neurological: She is alert.  Skin: Skin is warm and dry.  Psychiatric: She has a normal mood and affect.  Nursing note and vitals reviewed.   ED Course  Procedures (including critical care time) Labs Review Labs Reviewed  COMPREHENSIVE METABOLIC PANEL - Abnormal; Notable for the following:    Glucose, Bld 102 (*)    Total Protein 8.2 (*)    All other components within normal limits  CBC - Abnormal; Notable for the following:    WBC 11.4 (*)    All other components within normal limits  URINALYSIS, ROUTINE W REFLEX MICROSCOPIC (NOT AT [facility]) - Abnormal; Notable for the following:    APPearance CLOUDY (*)    Hgb urine dipstick MODERATE (*)    All other components within  normal limits  URINE MICROSCOPIC-ADD ON - Abnormal; Notable for the following:    Squamous Epithelial / LPF FEW (*)    Bacteria, UA FEW (*)    All other components within normal limits  LIPASE, BLOOD    Imaging Review Ct Abdomen Pelvis W Contrast  04/02/2015  CLINICAL DATA:  29 year old female with acute abdominal and pelvic pain with diarrhea. EXAM: CT ABDOMEN AND PELVIS WITH CONTRAST TECHNIQUE: Multidetector CT imaging of the abdomen and pelvis was performed using the standard protocol following bolus administration of intravenous contrast. CONTRAST:  OMNIPAQUE IOHEXOL 300 MG/ML  SOLN COMPARISON:  09/06/2013 CT FINDINGS: Lower chest:  Unremarkable Hepatobiliary: The liver and gallbladder are unremarkable. There is no evidence of biliary dilatation. Pancreas: Unremarkable Spleen: Unremarkable Adrenals/Urinary Tract: The adrenal glands, kidneys and  bladder are unremarkable. Stomach/Bowel: Unremarkable. There is no evidence of bowel obstruction or focal bowel wall thickening. The appendix is normal. Vascular/Lymphatic: Unremarkable. No enlarged lymph nodes or abdominal aortic aneurysm. Reproductive: Bilateral tubal ligation clips again noted. The uterus and adnexal regions are within normal limits. Other: No free fluid, abscess or pneumoperitoneum. Musculoskeletal: No acute or suspicious abnormality. IMPRESSION: No acute or significant abnormality. Electronically Signed   By: Harmon PierJeffrey  Hu M.D.   On: 04/02/2015 23:02   I have personally reviewed and evaluated these images and lab results as part of my medical decision-making.   EKG Interpretation None       8:19 PM Patient seen and examined. Work-up initiated. Medications ordered.   Vital signs reviewed and are as follows: BP 133/79 mmHg  Pulse 102  Temp(Src) 98.2 F (36.8 C) (Oral)  Resp 18  Ht 5\' 1"  (1.549 m)  Wt 200 lb (90.719 kg)  BMI 37.81 kg/m2  SpO2 95%  LMP 03/27/2015  Patient has done well during ED stay. CT performed  and is neg. No further vomiting or diarrhea. Slight itching after IV contrast -- benadryl given prior to discharge.   Will discharge to home with GI follow-up. Zofran given.  The patient was urged to return to the Emergency Department immediately with worsening of current symptoms, worsening abdominal pain, persistent vomiting, blood noted in stools, fever, or any other concerns. The patient verbalized understanding.    MDM   Final diagnoses:  Generalized abdominal pain  Diarrhea, unspecified type   Patient presents to the emergency department with continued abdominal pain, now with bloody diarrhea. Lab work is essentially unchanged from previous visit. CT performed tonight which was negative. Patient appears well, nontoxic. She is tolerating fluids. Her abdomen is generally tender, mild tenderness, soft. Will discharge home with symptomatic care and GI follow-up. Vitals are stable, no fever. Lungs are clear. No focal abdominal pain, no concern for appendicitis, cholecystitis, pancreatitis, ruptured viscus, UTI, kidney stone, or any other emergent abdominal etiology. Supportive therapy indicated with return if symptoms worsen. Patient counseled.    Renne CriglerJoshua Lander Eslick, PA-C 04/03/15 [id number]  Doug SouSam Jacubowitz, MD 04/03/15 [id number]

## 2015-04-02 NOTE — ED Notes (Signed)
Pt presents d/t abdominal pain and diarrhea.  On third episode of diarrhea pt noticed blood in her stool and has a photo.  Pain rated 4/10 in LUQ/RLQ with intermittent severe pain.

## 2015-04-02 NOTE — Discharge Instructions (Signed)
Please read and follow all provided instructions.  Your diagnoses today include:  1. Generalized abdominal pain   2. Diarrhea, unspecified type     Tests performed today include:  Blood counts and electrolytes  Blood tests to check liver and kidney function  Blood tests to check pancreas function  Vital signs. See below for your results today.   Medications prescribed:   Zofran (ondansetron) - for nausea and vomiting  Take any prescribed medications only as directed.  Home care instructions:   Follow any educational materials contained in this packet.  Follow-up instructions: Please follow-up with your primary care provider in the next 3 days for further evaluation of your symptoms.    Return instructions:  SEEK IMMEDIATE MEDICAL ATTENTION IF:  The pain does not go away or becomes severe   A temperature above 101F develops   Repeated vomiting occurs (multiple episodes)   The pain becomes localized to portions of the abdomen. The right side could possibly be appendicitis. In an adult, the left lower portion of the abdomen could be colitis or diverticulitis.   Blood is being passed in stools or vomit (bright red or black tarry stools)   You develop chest pain, difficulty breathing, dizziness or fainting, or become confused, poorly responsive, or inconsolable (young children)  If you have any other emergent concerns regarding your health  Additional Information: Abdominal (belly) pain can be caused by many things. Your caregiver performed an examination and possibly ordered blood/urine tests and imaging (CT scan, x-rays, ultrasound). Many cases can be observed and treated at home after initial evaluation in the emergency department. Even though you are being discharged home, abdominal pain can be unpredictable. Therefore, you need a repeated exam if your pain does not resolve, returns, or worsens. Most patients with abdominal pain don't have to be admitted to the hospital  or have surgery, but serious problems like appendicitis and gallbladder attacks can start out as nonspecific pain. Many abdominal conditions cannot be diagnosed in one visit, so follow-up evaluations are very important.  Your vital signs today were: BP 147/82 mmHg   Pulse 108   Temp(Src) 98.9 F (37.2 C) (Oral)   Resp 18   Ht 5\' 1"  (1.549 m)   Wt 200 lb (90.719 kg)   BMI 37.81 kg/m2   SpO2 97%   LMP 03/27/2015 If your blood pressure (bp) was elevated above 135/85 this visit, please have this repeated by your doctor within one month. --------------

## 2015-05-16 ENCOUNTER — Ambulatory Visit: Payer: 59 | Admitting: Internal Medicine

## 2015-05-16 DIAGNOSIS — R0989 Other specified symptoms and signs involving the circulatory and respiratory systems: Secondary | ICD-10-CM

## 2015-05-18 ENCOUNTER — Encounter: Payer: Self-pay | Admitting: Internal Medicine

## 2016-01-02 ENCOUNTER — Telehealth: Payer: Self-pay | Admitting: Internal Medicine

## 2016-01-02 NOTE — Telephone Encounter (Signed)
Lm to call back.  Need Dr. Henriette Combs office to send request to [place].

## 2016-01-02 NOTE — Telephone Encounter (Signed)
Returning call.  Advised that I had contacted Dr. Henriette Combs office to fax clearance form to Dr. Tenny Craw.  She also would like to make an appointment to come in to get meds refilled.  Gave her an appointment 8/31 w/Scott Weaver,PA.  She is agreeable and will keep that appointment.

## 2016-01-02 NOTE — Telephone Encounter (Signed)
Spoke w/Dr. Henriette Combs office and they will be faxing clearance form to Dr. Tenny Craw regarding he appointment for tooth extraction.

## 2016-01-02 NOTE — Telephone Encounter (Signed)
New message    Pt needs surgical clearance  Request for surgical clearance:  1. What type of surgery is being performed? Tooth extracted   2. When is this surgery scheduled? Sept 13, 2017  3. Are there any medications that need to be held prior to surgery and how long? no  Name of physician performing surgery? Dr. Laural Benes  4. What is your office phone and fax number? [phone], no fax #    *STAT* If patient is at the pharmacy, call can be transferred to refill team.   1. Which medications need to be refilled? (please list name of each medication and dose if known) metropolol 45mg  1/2 twice a day  2. Which pharmacy/location (including street and city if local pharmacy) is medication to be sent to? [facility] in [place], [place]  3. Do they need a 30 day or 90 day supply? 30  Pt offered 10.02.17 appt/refused would like an earlier appt b/c heart racing, light headed, confusion, feeling of being off balance. Experiencing symptoms for 2 years. Please call.

## 2016-01-09 ENCOUNTER — Encounter: Payer: Self-pay | Admitting: Physician Assistant

## 2016-01-16 ENCOUNTER — Encounter: Payer: Self-pay | Admitting: *Deleted

## 2016-01-18 ENCOUNTER — Encounter: Payer: Self-pay | Admitting: Obstetrics & Gynecology

## 2016-01-24 ENCOUNTER — Ambulatory Visit (INDEPENDENT_AMBULATORY_CARE_PROVIDER_SITE_OTHER): Payer: Medicaid Other | Admitting: Obstetrics & Gynecology

## 2016-01-24 ENCOUNTER — Encounter: Payer: Self-pay | Admitting: Obstetrics & Gynecology

## 2016-01-24 VITALS — BP 100/70 | HR 76 | Ht 61.0 in | Wt 196.0 lb

## 2016-01-24 DIAGNOSIS — N941 Unspecified dyspareunia: Secondary | ICD-10-CM

## 2016-01-24 DIAGNOSIS — N921 Excessive and frequent menstruation with irregular cycle: Secondary | ICD-10-CM | POA: Diagnosis not present

## 2016-01-24 DIAGNOSIS — N946 Dysmenorrhea, unspecified: Secondary | ICD-10-CM

## 2016-01-24 DIAGNOSIS — F3281 Premenstrual dysphoric disorder: Secondary | ICD-10-CM | POA: Diagnosis not present

## 2016-01-24 MED ORDER — MEGESTROL ACETATE 40 MG PO TABS: ORAL_TABLET | ORAL | 3 refills | Status: DC

## 2016-01-24 NOTE — Progress Notes (Signed)
Chief Complaint  Patient presents with  . Referral    frequent/ heavy period and cramping. no bleeding today.     HPI:    30 y.o. GPatient's last menstrual period was 01/10/2016.  Uses pads occasionally sore sheets and clothes S/p tubal ligation with her last Caesarean section [id number] Location:  Pain goes all the way across to worsen the left side. Quality:  Crampy to sharp. Severity:  Moderate severe. Timing:  Bleeds once or twice a month up to 7 days at a time. Duration:  7-8 days. Context:  Generally associated with her periods. Modifying factors:  She gets premenstrual dysphoric disorder before cycles Signs/Symptoms:  Associated with menses  Has with intercourse midline doesn't happen every time    Current Outpatient Prescriptions:  .  diphenhydrAMINE (BENADRYL) 25 mg capsule, Take 25 mg by mouth every 6 (six) hours as needed for itching., Disp: , Rfl:  .  metoprolol tartrate (LOPRESSOR) 25 MG tablet, Take 0.5 tablets (12.5 mg total) by mouth 2 (two) times daily., Disp: 30 tablet, Rfl: 5 .  ZOLOFT 100 MG tablet, Take 100 mg by mouth daily., Disp: , Rfl:  .  megestrol (MEGACE) 40 MG tablet, 3 tablets a day for 5 days, 2 tablets a day for 5 days then 1 tablet daily, Disp: 45 tablet, Rfl: 3  Problem Pertinent ROS:       No burning with urination, + frequency sometimes gets up frequently at night sometimes as often as every 30 minutes No nausea, vomiting or diarrhea Nor fever chills or other constitutional symptoms   Extended ROS:     Not applicable   PMFSH:             Past Medical History:  Diagnosis Date  . Anxiety   . Depression   . DUB (dysfunctional uterine bleeding)   . Dysautonomia   . Panic attacks   . POTS (postural orthostatic tachycardia syndrome)   . SVT (supraventricular tachycardia) (HCC) 2004   S/P slow path modification    Past Surgical History:  Procedure Laterality Date  . CESAREAN SECTION    . TUBAL LIGATION    . VENTRICULAR  ABLATION SURGERY     For supraventricular tachycardia    OB History    Gravida Para Term Preterm AB Living   4 4 4          SAB TAB Ectopic Multiple Live Births                  Allergies  Allergen Reactions  . Shellfish Allergy Hives  . Soy Allergy Hives  . 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 Marital status: Married    Spouse name: N/A  . Number of children: N/A  . Years of education: N/A   Social History Main Topics  . Smoking status: Never Smoker  . Smokeless tobacco: Never Used  . Alcohol use No     Comment: Denies  . Drug use: No     Comment: Denies  . Sexual activity: Yes    Birth control/ protection: Surgical   Other Topics Concern  . None   Social History Narrative  . None    Family History  Problem Relation Age of Onset  . Heart failure Mother   . Heart disease Mother   . Hypertension Mother   . Diabetes Mother   . Heart failure  Father   . Hypertension Other   . Diabetes Other   . Depression Other      Examination:  Vitals:  Blood pressure 100/70, pulse 76, height 5\' 1"  (1.549 m), weight 196 lb (88.9 kg), last menstrual period 01/10/2016.    Physical Examination:     General Appearance:  well developed, well nourished  Vulva:  normal appearing vulva with no masses, tenderness or lesions Vagina:  normal mucosa, no discharge, no  lesions Cervix:  no cervical motion tenderness and no lesions Uterus:  normal size, contour, position, consistency, mobility, non-tender stuck up high in the pelvis probably secondary to 3 C-sections Adnexa: ovaries:present,  normal adnexa in size, nontender and no masses  No results found for this or any previous visit (from the past 72 hour(s)).   DATA orders and reviews: Labs were not ordered today:   Imaging studies were ordered today:  sonogram  Lab tests were reviewed today:   From referring office Imaging  studies were not reviewed today:  There were not any  I did not independently review/view images, tracing or specimen(not simply the report) myself.    Prescription Drug Management:   Prescriptions prescribed this encounter:    Meds ordered this encounter  Medications  . megestrol (MEGACE) 40 MG tablet    Sig: 3 tablets a day for 5 days, 2 tablets a day for 5 days then 1 tablet daily    Dispense:  45 tablet    Refill:  3    Renewed Prescriptions:    Current prescription changes:     Impression/Plan(Problem Based):  There are no diagnoses linked to this encounter. Menometrorrhagia - Plan: US Pelvis Complete, US Transvaginal Non-OB  Dysmenorrhea - Plan: US Pelvis Complete, US Transvaginal Non-OB  Dyspareunia, female - Plan: US Pelvis Complete, US Transvaginal Non-OB  PMDD (premenstrual dysphoric disorder) - Plan: US Pelvis Complete, US Transvaginal Non-OB    Follow Up:   Return in about 1 month (around 02/24/2016) for GYN sono, Follow up, with Dr Despina HiddenEure.         All questions were answered.

## 2016-02-09 ENCOUNTER — Ambulatory Visit: Payer: Self-pay | Admitting: Physician Assistant

## 2016-02-22 ENCOUNTER — Telehealth: Payer: Self-pay | Admitting: *Deleted

## 2016-02-22 NOTE — Telephone Encounter (Signed)
Received fax from Sonny DandyMalrie E. Johnson, DDS [facility](PH [phone], Valinda HoarFAX [phone]) Treatment needs: #15 - extraction Notes: anesthetic used: Lidocaine 1:100,000 epinephrine (we do have anesthetic without epinephrine available for use if needed)  Pt was scheduled for today.   I just received fax in Dr. Charlott Rakesoss's mailbox today, it originally was sent on 01/02/16.  Forwarding to Dr. Tenny Crawoss for review/advisement and will fax back to dental office.

## 2016-02-23 NOTE — Telephone Encounter (Signed)
Pt would be OK for either anesthetic   Probably better without epi

## 2016-02-23 NOTE — Telephone Encounter (Signed)
[facility] in EPIC [facility].

## 2016-02-24 ENCOUNTER — Ambulatory Visit (INDEPENDENT_AMBULATORY_CARE_PROVIDER_SITE_OTHER): Payer: Medicaid Other | Admitting: Obstetrics & Gynecology

## 2016-02-24 ENCOUNTER — Encounter: Payer: Self-pay | Admitting: Obstetrics & Gynecology

## 2016-02-24 ENCOUNTER — Ambulatory Visit (INDEPENDENT_AMBULATORY_CARE_PROVIDER_SITE_OTHER): Payer: Medicaid Other

## 2016-02-24 ENCOUNTER — Encounter (INDEPENDENT_AMBULATORY_CARE_PROVIDER_SITE_OTHER): Payer: Self-pay

## 2016-02-24 VITALS — BP 124/80 | HR 82 | Ht 61.0 in | Wt 192.0 lb

## 2016-02-24 DIAGNOSIS — N941 Unspecified dyspareunia: Secondary | ICD-10-CM

## 2016-02-24 DIAGNOSIS — F3281 Premenstrual dysphoric disorder: Secondary | ICD-10-CM

## 2016-02-24 DIAGNOSIS — N946 Dysmenorrhea, unspecified: Secondary | ICD-10-CM

## 2016-02-24 DIAGNOSIS — N854 Malposition of uterus: Secondary | ICD-10-CM

## 2016-02-24 DIAGNOSIS — N921 Excessive and frequent menstruation with irregular cycle: Secondary | ICD-10-CM

## 2016-02-24 MED ORDER — MEGESTROL ACETATE 40 MG PO TABS: ORAL_TABLET | ORAL | 11 refills | Status: DC

## 2016-02-24 NOTE — Progress Notes (Signed)
Follow up appointment for results  Chief Complaint  Patient presents with  . Follow-up    Ultrasound today    Blood pressure 124/80, pulse 82, height 5\' 1"  (1.549 m), weight 192 lb (87.1 kg), last menstrual period 02/14/2016.  [place] Transvaginal Non-ob  Result Date: 02/24/2016 GYNECOLOGIC SONOGRAM [name] [name] is a 30 y.o. G4P4000 LMP 02/14/2016 for a pelvic sonogram for menometrorrhagia/dysmenorrhea. Uterus                      11.3 x 6.2 x 4.5 cm, homogeneous retroverted uterus wnl Endometrium          10.3 mm, symmetrical, wnl (limited view) Right ovary             2 x 1.4 x 2 cm, wnl Left ovary                2.2 x 2.7 x 2 cm, wnl No free fluid,pelvic pain during ultrasound Technician Comments: PELVIC US TA/TV:Homogeneous retroverted uterus wnl,EEC 10.3 mm,normal ov's bilat,pelvic pain during ultrasound,no free fluid,limited view TV because of uterine position. Amber Flora LippsJ Carl 02/24/2016 11:39 AM Clinical Impression and recommendations: I have reviewed the sonogram results above, combined with the patient's current clinical course, below are my impressions and any appropriate recommendations for management based on the sonographic findings. Normal uterus and endometrium Normal ovaries EURE,LUTHER H 02/24/2016 11:40 AM   [place] Pelvis Complete  Result Date: 02/24/2016 GYNECOLOGIC SONOGRAM [name] [name] is a 30 y.o. G4P4000 LMP 02/14/2016 for a pelvic sonogram for menometrorrhagia/dysmenorrhea. Uterus                      11.3 x 6.2 x 4.5 cm, homogeneous retroverted uterus wnl Endometrium          10.3 mm, symmetrical, wnl (limited view) Right ovary             2 x 1.4 x 2 cm, wnl Left ovary                2.2 x 2.7 x 2 cm, wnl No free fluid,pelvic pain during ultrasound Technician Comments: PELVIC US TA/TV:Homogeneous retroverted uterus wnl,EEC 10.3 mm,normal ov's bilat,pelvic pain during ultrasound,no free fluid,limited view TV because of uterine position. Amber Flora LippsJ Carl 02/24/2016 11:39 AM Clinical  Impression and recommendations: I have reviewed the sonogram results above, combined with the patient's current clinical course, below are my impressions and any appropriate recommendations for management based on the sonographic findings. Normal uterus and endometrium Normal ovaries EURE,LUTHER H 02/24/2016 11:40 AM      MEDS ordered this encounter: Meds ordered this encounter  Medications  . buPROPion (WELLBUTRIN XL) 150 MG 24 hr tablet    Sig: Take 150 mg by mouth daily.   Marland Kitchen. EPINEPHrine 0.3 mg/0.3 mL IJ SOAJ injection    Sig: Inject 0.3 mg into the muscle as needed.     Orders for this encounter: No orders of the defined types were placed in this encounter.   Impression: Menometrorrhagia  Dysmenorrhea  Dyspareunia, female  PMDD (premenstrual dysphoric disorder)    Plan: Megestrol therapy for now, knows ablation would be solution for bleeding dysmenorrhea Hysterectomy for all the issues including dyspareunia  For now she will stick with the megestrol     Follow Up: Return in about 4 months (around 06/25/2016) for Follow up, with Dr Despina HiddenEure.    All questions were answered.  Past Medical History:  Diagnosis Date  . Anxiety   . Depression   .  DUB (dysfunctional uterine bleeding)   . Dysautonomia   . Panic attacks   . POTS (postural orthostatic tachycardia syndrome)   . SVT (supraventricular tachycardia) (HCC) 2004   S/P slow path modification    Past Surgical History:  Procedure Laterality Date  . CESAREAN SECTION    . TUBAL LIGATION    . VENTRICULAR ABLATION SURGERY     For supraventricular tachycardia    OB History    Gravida Para Term Preterm AB Living   4 4 4          SAB TAB Ectopic Multiple Live Births                  Allergies  Allergen Reactions  . Shellfish Allergy Hives  . Soy Allergy Hives  . 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 Marital  status: Married    Spouse name: N/A  . Number of children: N/A  . Years of education: N/A   Social History Main Topics  . Smoking status: Never Smoker  . Smokeless tobacco: Never Used  . Alcohol use No     Comment: Denies  . Drug use: No     Comment: Denies  . Sexual activity: Yes    Partners: Male    Birth control/ protection: Surgical   Other Topics Concern  . None   Social History Narrative  . None    Family History  Problem Relation Age of Onset  . Heart failure Mother   . Heart disease Mother   . Hypertension Mother   . Diabetes Mother   . Heart failure Father   . Hypertension Other   . Diabetes Other   . Depression Other

## 2016-02-24 NOTE — Progress Notes (Addendum)
PELVIC US TA/TV:Homogeneous retroverted uterus wnl,EEC 10.3 mm,normal ov's bilat,pelvic pain during ultrasound,no free fluid,limited view TV because of uterine position.

## 2016-05-01 ENCOUNTER — Other Ambulatory Visit (HOSPITAL_COMMUNITY): Payer: Self-pay | Admitting: Adult Health Nurse Practitioner

## 2016-05-01 ENCOUNTER — Ambulatory Visit (HOSPITAL_COMMUNITY)
Admission: RE | Admit: 2016-05-01 | Discharge: 2016-05-01 | Disposition: A | Discharge status: Home / Self Care | Payer: Medicaid Other | Source: Ambulatory Visit | Attending: Adult Health Nurse Practitioner | Admitting: Adult Health Nurse Practitioner

## 2016-05-01 DIAGNOSIS — M5441 Lumbago with sciatica, right side: Secondary | ICD-10-CM | POA: Insufficient documentation

## 2016-05-01 DIAGNOSIS — M542 Cervicalgia: Secondary | ICD-10-CM

## 2016-05-09 ENCOUNTER — Ambulatory Visit: Payer: Medicaid Other | Attending: Adult Health Nurse Practitioner | Admitting: Physical Therapy

## 2016-05-09 DIAGNOSIS — M545 Low back pain: Secondary | ICD-10-CM | POA: Diagnosis present

## 2016-05-09 DIAGNOSIS — R293 Abnormal posture: Secondary | ICD-10-CM | POA: Insufficient documentation

## 2016-05-09 DIAGNOSIS — M542 Cervicalgia: Secondary | ICD-10-CM | POA: Insufficient documentation

## 2016-05-09 DIAGNOSIS — G8929 Other chronic pain: Secondary | ICD-10-CM | POA: Diagnosis present

## 2016-05-09 NOTE — Therapy (Signed)
[facility] [address] [place], [place], [address] Phone: [phone]   Fax:  [phone]  Physical Therapy Evaluation  Patient Details  Name: [name] [name] MRN: [id number] Date of Birth: 07-13-1985 Referring Provider: Sharon SellerKatherine Hemberg   Encounter Date: 05/09/2016      PT End of Session - 05/09/16 1742    PT Start Time 0152   PT Stop Time 0229   PT Time Calculation (min) 37 min   Activity Tolerance Patient tolerated treatment well   Behavior During Therapy [facility] for tasks assessed/performed      Past Medical History:  Diagnosis Date  . Anxiety   . Depression   . DUB (dysfunctional uterine bleeding)   . Dysautonomia   . Panic attacks   . POTS (postural orthostatic tachycardia syndrome)   . SVT (supraventricular tachycardia) (HCC) 2004   S/P slow path modification    Past Surgical History:  Procedure Laterality Date  . CESAREAN SECTION    . TUBAL LIGATION    . VENTRICULAR ABLATION SURGERY     For supraventricular tachycardia    There were no vitals filed for this visit.       Subjective Assessment - 05/09/16 1737    Subjective The patient presents to OPPT with c/o neck and low back pain over many years.  She remembers during one of her pregnancies (third trimester) that she slipped and fell on her buttocks.  She reports since then she has had chronic low back pain with pain eventually moving up her spine to her neck.  She reports pain essentially all along her spine and occasional right LE numbness.  Her resting pain-level is a 4/10 today but can rise to a 10/10 with increased activites especially lifting.   Limitations Standing   How long can you sit comfortably? 15 minutes.   How long can you stand comfortably? 15 minutes.   Patient Stated Goals Get out of pain.   Currently in Pain? Yes   Pain Score 4    Pain Location Back   Pain Orientation Right   Pain Descriptors / Indicators Aching   Pain Type Chronic  pain   Pain Onset More than a month ago   Pain Frequency Constant   Aggravating Factors  Please see above.   Pain Relieving Factors Please see above.   Multiple Pain Sites Yes   Pain Score 4   Pain Location Neck   Pain Orientation Right;Left   Pain Descriptors / Indicators Aching   Pain Type Chronic pain   Pain Onset More than a month ago   Pain Frequency Constant   Aggravating Factors  Please see above.   Pain Relieving Factors Please see above.            [facility] PT Assessment - 05/09/16 0001      Assessment   Medical Diagnosis Chronic neck and low back pain.   Referring Provider Sharon SellerKatherine Hemberg    Onset Date/Surgical Date --  Many years.     Precautions   Precautions None     Restrictions   Weight Bearing Restrictions No     Balance Screen   Has the patient fallen in the past 6 months Yes   How many times? --  "Several."   Has the patient had a decrease in activity level because of a fear of falling?  Yes   Is the patient reluctant to leave their home because of a fear of falling?  --  "Somewhat"  Home Environment   Living Environment Private residence     Prior Function   Level of Independence Independent     Posture/Postural Control   Posture/Postural Control Postural limitations   Posture Comments Right posterior pelvic rotation.     ROM / Strength   AROM / PROM / Strength AROM;Strength     AROM   Overall AROM Comments Normal bilateral LE AROM.  Lumbar extension painful and limited to 18 degrees with lumbar flexion limited by 50% due to pain.     Strength   Overall Strength Comments Normal bilateral LE strength.     Palpation   Palpation comment Diffuse c/o pain over both UT; thoracic musculature diffusely and right low back/QL region with active TP's noted.  Tender over right SIJ.     Special Tests    Special Tests Lumbar;Sacrolliac Tests;Leg LengthTest   Lumbar Tests --  (+) SLR.   Sacroiliac Tests  --  (+) Right FABER test.   Leg  length test  --  Right leg significantly shorter than left.     Ambulation/Gait   Gait Comments Antalgic gait pattern.                                PT Long Term Goals - 05/09/16 1745      PT LONG TERM GOAL #1   Title Eval only.               Plan - 05/09/16 1743    Clinical Impression Statement The patient has diffuse spinal pain.  This has been an avolving situation and worsening over many years.  She has a leg length discrepancy with left shorter than right due to a posterior pelvic rotation.  She has active triggers points over her cervical paraspinal musculature; thoracic musculature and lumbar musculature.   Rehab Potential Fair   PT Frequency --  Eval only.   Consulted and Agree with Plan of Care Patient      Patient will benefit from skilled therapeutic intervention in order to improve the following deficits and impairments:  Pain, Decreased activity tolerance, Decreased range of motion  Visit Diagnosis: Cervicalgia - Plan: PT plan of care cert/re-cert  Chronic bilateral low back pain, with sciatica presence unspecified - Plan: PT plan of care cert/re-cert  Abnormal posture - Plan: PT plan of care cert/re-cert     Problem List Patient Active Problem List   Diagnosis Date Noted  . Obesity (BMI 35.0-39.9 without comorbidity) 02/24/2014  . Dysautonomia 12/21/2010  . 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 UNSPECIFIED HYPOTENSION 02/23/2009  . UNSPECIFIED TACHYCARDIA 02/23/2009    [name], [place] MPT 05/09/2016, 5:54 PM  [facility] [address] [place], [place], [address] Phone: [phone]   Fax:  [phone]  Name: [name] [name] MRN: [id number] Date of Birth: Nov 16, 1985

## 2016-05-15 ENCOUNTER — Telehealth: Payer: Self-pay | Admitting: Internal Medicine

## 2016-05-15 NOTE — Telephone Encounter (Signed)
New message  1. No chest pain now 2. Nausea, dizzy, weak, fatique, HR goes up and down, SOB with activity 3. Past 3-4 weeks almost every day 4. Does not take nitro

## 2016-05-15 NOTE — Telephone Encounter (Signed)
Returned call.  Patient stated that she had been having episodes of sob, dizziness, lightheadedness with excertion for the last several weeks.  Had missed last appt and wants to schedule another one as soon as possible.  Scheduled her for 05-18-16 at 8:15.  Patient good with appt.

## 2016-05-17 ENCOUNTER — Encounter: Payer: Self-pay | Admitting: Physician Assistant

## 2016-05-17 NOTE — Progress Notes (Signed)
Cardiology Office Note:    Date:  05/18/2016   ID:  [name] SkeansAmanda Aldaba, DOB Aug 06, 1985, MRN [id number]  PCP:  PROVIDER NOT IN SYSTEM  Cardiologist:  Dr. Dietrich PatesPaula Ross   Electrophysiologist:  n/a  Referring MD: No ref. provider found   Chief Complaint  Patient presents with  . Chest Pain  . Palpitations    History of Present Illness:    [name] [name] Marsch is a 30 y.o. female with a hx of autonomic dysfunction/POTS and SVT in 2004 s/p RFCA by Dr. Sherryl MangesSteven Klein.  Last seen by Dr. Tenny Crawoss in 3/16.  She comes in today for the evaluation of chest discomfort and palpitations. This has been ongoing for the past 3 weeks. Of note, she started a new job about 6-8 weeks ago. She works in a [employer] company. Prior to this, she was not working. She does do a lot of bending and lifting. She notes that she is cold at times. At other times, she is also warm and does sweat. She has also recently been diagnosed with bipolar schizoaffective disorder with PTSD. She was placed on Saphris which is an antipsychotic. She was also placed on Prazosin to help with nightmares.  She notes her chest discomfort with any type of activity. This seems to be associated with palpitations. She has noticed her heart rate as high as 150s at times. She has noticed this with just sitting still resting. She does note associated dyspnea with her chest symptoms. She denies any radiating symptoms or nausea. She's been diaphoretic at times. She denies syncope. She denies orthopnea or PND. She denies significant edema. She does note some dizziness with going from a seated to standing position. She had a gastrointestinal illness recently with diarrhea for about 2 days. This has since resolved.  Prior CV studies that were reviewed today include:    Echo 6/04 EF 55-65, normal wall motion   Past Medical History:  Diagnosis Date  . Anxiety   . Depression   . DUB (dysfunctional uterine bleeding)   . Dysautonomia   . Panic attacks   . POTS  (postural orthostatic tachycardia syndrome)   . Schizoaffective disorder, bipolar type (HCC)    With PTSD  . SVT (supraventricular tachycardia) (HCC) 2004   S/P slow path modification    Past Surgical History:  Procedure Laterality Date  . CESAREAN SECTION    . TUBAL LIGATION    . VENTRICULAR ABLATION SURGERY     For supraventricular tachycardia    Current Medications: Current Meds  Medication Sig  . clonazePAM (KLONOPIN) 0.5 MG tablet Take 0.5 mg by mouth daily as needed for anxiety.  . diphenhydrAMINE (BENADRYL) 25 mg capsule Take 25 mg by mouth every 6 (six) hours as needed for itching.  Marland Kitchen. EPINEPHrine 0.3 mg/0.3 mL IJ SOAJ injection Inject 0.3 mg into the muscle as needed.   . megestrol (MEGACE) 40 MG tablet 3 tablets a day for 5 days, 2 tablets a day for 5 days then 1 tablet daily  . prazosin (MINIPRESS) 1 MG capsule Take 1 mg by mouth daily.     Allergies:   Shellfish allergy; Soy allergy; and Vancomycin   Social History   Social History  . Marital status: Married    Spouse name: N/A  . Number of children: N/A  . Years of education: N/A   Social History Main Topics  . Smoking status: Never Smoker  . Smokeless tobacco: Never Used  . Alcohol use No     Comment: Denies  .  Drug use: No     Comment: Denies  . Sexual activity: Yes    Partners: Male    Birth control/ protection: Surgical   Other Topics Concern  . None   Social History Narrative  . None     Family History:  The patient's family history includes Depression in her other; Diabetes in her mother and other; Heart disease in her mother; Heart failure in her father and mother; Hypertension in her mother and other.   ROS:   Please see the history of present illness.    Review of Systems  Constitution: Positive for chills, diaphoresis and malaise/fatigue.  HENT: Positive for hearing loss.   Eyes: Positive for visual disturbance.  Cardiovascular: Positive for chest pain, dyspnea on exertion and  irregular heartbeat.  Hematologic/Lymphatic: Bruises/bleeds easily.  Musculoskeletal: Positive for back pain, joint pain and myalgias.  Gastrointestinal: Positive for abdominal pain, diarrhea and nausea.  Neurological: Positive for dizziness, headaches and loss of balance.  Psychiatric/Behavioral: Positive for depression. The patient is nervous/anxious.    All other systems reviewed and are negative.   EKGs/Labs/Other Test Reviewed:    EKG:  EKG is  ordered today.  The ekg ordered today demonstrates NSR, HR 96, normal axis, QTC 439 ms, no ST-T wave changes  Recent Labs: No results found for requested labs within last 8760 hours.   Recent Lipid Panel    Component Value Date/Time   CHOL 151 08/08/2012 0934   TRIG 101.0 08/08/2012 0934   HDL 45.70 08/08/2012 0934   CHOLHDL 3 08/08/2012 0934   VLDL 20.2 08/08/2012 0934   LDLCALC 85 08/08/2012 0934     Physical Exam:    VS:  BP 115/79 (BP Location: Left Arm, Cuff Size: Normal)   Pulse 100   Ht 5\' 1"  (1.549 m)   Wt 197 lb (89.4 kg)   BMI 37.22 kg/m     No data found.  Wt Readings from Last 3 Encounters:  05/18/16 197 lb (89.4 kg)  02/24/16 192 lb (87.1 kg)  01/24/16 196 lb (88.9 kg)     Physical Exam  Constitutional: She is oriented to person, place, and time. She appears well-developed and well-nourished. No distress.  HENT:  Head: Normocephalic and atraumatic.  Eyes: No scleral icterus.  Neck: No JVD present.  Cardiovascular: Normal rate, regular rhythm and normal heart sounds.   No murmur heard. Pulmonary/Chest: Effort normal. She has no wheezes. She has no rales.  Abdominal: Soft. There is no tenderness.  Musculoskeletal: She exhibits no edema.  Neurological: She is alert and oriented to person, place, and time.  Skin: Skin is warm and dry.  Psychiatric: She has a normal mood and affect.    ASSESSMENT:    1. Palpitations   2. Chest pain, unspecified type   3. POTS (postural orthostatic tachycardia  syndrome)   4. PSVT (paroxysmal supraventricular tachycardia) (HCC)   5. Schizoaffective disorder, bipolar type (HCC)    PLAN:    In order of problems listed above:  1. Palpitations - Her palpitations are likely related to her autonomic dysfunction and POTS. I suspect that this has been exacerbated by starting her new job which involves a lot of physical activity. She has not been used to this type of activity. 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 Her orthostatic vital signs today do not show a significant drop in her blood pressure from lying to standing. She has a minimal increase in her heart rate. However,  it sounds as though she may be volume deplete. I have asked her to increase her volume intake. I have also asked her to add some salt to her diet and to raise the head of her bed. I also gave her a prescription to get compression stockings.  She does have some symptoms of palpitations with a rapid heartbeat while at rest. She does have a history of SVT in the past and is status post prior ablation.   -  I will arrange a 30 day event monitor.  2. Chest pain - Her chest pain is somewhat atypical. I do think that this is all related to her rapid heartbeats. However, she seems to note chest discomfort with any type of activity. She does not have significant risk factors for CAD. However, her symptoms are always exertional.  -  I will arrange an ETT-echocardiogram.  3. POTS - As noted, I have reiterated to her today that she needs to push her fluids, be liberal with salt, wear compression stockings and stand up slowly after lying or sitting for a prolonged period of time.  4. SVT - s/p RFCA in 2004.  She has had some rapid palpitations recently with heart rates in the 150s while seated. I have arranged for a 30 day event monitor.  5. Bipolar d/o - She was recently placed on 2 separate medications for her diagnosis of bipolar schizoaffective disorder. Prazosin is an  alpha-blocker and can contribute to orthostatic hypotension. I have recommended that she not use the Prazosin. Her antipsychotic drug does list orthostatic hypotension as a potential side effect. However, this is not likely to be as significant as what she would experience with the Prazosin. I have asked her to discuss her hx of POTS with her Psychiatrist to see if Saphris is still a good option for her.   Medication Adjustments/Labs and Tests Ordered: Current medicines are reviewed at length with the patient today.  Concerns regarding medicines are outlined above.  Medication changes, Labs and Tests ordered today are outlined in the Patient Instructions noted below. Patient Instructions  Medication Instructions:  I will refill your Metoprolol Tartrate 25 mg to take 1/2 tablet Twice daily as needed for palpitations   Labwork: Today - BMET, Magnesium, TSH  Testing/Procedures: 1. Schedule a stress echocardiogram  2. Schedule a 30 day event monitor  Follow-Up: Dr. Dietrich PatesPaula Ross in 6-8 weeks.  Any Other Special Instructions Will Be Listed Below (If Applicable). I will give you a prescription for compression stockings that you can get at a medical supply store. Drink plenty of water. You can even drink some Gatorade/Powerade.  You want your urine to look clear. It is ok to add salt to your foods. Raise the head of your bed so that you do not have as far to stand up after being in bed all night. Pump your calf muscles before you stand up. You may want to get some compression pants or "Spanx" to where.  If you need a refill on your cardiac medications before your next appointment, please call your pharmacy.   Signed, Tereso NewcomerScott Kassity Woodson, PA-C  05/18/2016 12:24 PM    [facility] [address], [place], [place]  [address] Phone: [phone](336) [phone]; Fax: [phone]

## 2016-05-18 ENCOUNTER — Telehealth: Payer: Self-pay | Admitting: *Deleted

## 2016-05-18 ENCOUNTER — Ambulatory Visit (INDEPENDENT_AMBULATORY_CARE_PROVIDER_SITE_OTHER): Payer: Medicaid Other | Admitting: Physician Assistant

## 2016-05-18 ENCOUNTER — Encounter: Payer: Self-pay | Admitting: Physician Assistant

## 2016-05-18 ENCOUNTER — Other Ambulatory Visit: Payer: Self-pay | Admitting: Physician Assistant

## 2016-05-18 VITALS — BP 115/79 | HR 100 | Ht 61.0 in | Wt 197.0 lb

## 2016-05-18 DIAGNOSIS — G90A Postural orthostatic tachycardia syndrome (POTS): Secondary | ICD-10-CM

## 2016-05-18 DIAGNOSIS — R079 Chest pain, unspecified: Secondary | ICD-10-CM | POA: Diagnosis not present

## 2016-05-18 DIAGNOSIS — I951 Orthostatic hypotension: Secondary | ICD-10-CM

## 2016-05-18 DIAGNOSIS — I471 Supraventricular tachycardia: Secondary | ICD-10-CM | POA: Diagnosis not present

## 2016-05-18 DIAGNOSIS — F250 Schizoaffective disorder, bipolar type: Secondary | ICD-10-CM | POA: Insufficient documentation

## 2016-05-18 DIAGNOSIS — R002 Palpitations: Secondary | ICD-10-CM | POA: Diagnosis not present

## 2016-05-18 DIAGNOSIS — R000 Tachycardia, unspecified: Secondary | ICD-10-CM

## 2016-05-18 LAB — TSH: TSH: 1.28 mIU/L

## 2016-05-18 LAB — MAGNESIUM: Magnesium: 1.9 mg/dL (ref 1.5–2.5)

## 2016-05-18 MED ORDER — METOPROLOL TARTRATE 25 MG PO TABS: 12.5000 mg | ORAL_TABLET | Freq: Two times a day (BID) | ORAL | 3 refills | Status: DC | PRN

## 2016-05-18 NOTE — Telephone Encounter (Signed)
Lmtcb to go over lab results 

## 2016-05-18 NOTE — Telephone Encounter (Signed)
Follow Up:; ° ° °Returning your call. °

## 2016-05-18 NOTE — Patient Instructions (Signed)
Medication Instructions:  I will refill your Metoprolol Tartrate 25 mg to take 1/2 tablet Twice daily as needed for palpitations   Labwork: Today - BMET, Magnesium, TSH  Testing/Procedures: 1. Schedule a stress echocardiogram  2. Schedule a 30 day event monitor  Follow-Up: Dr. Dietrich PatesPaula Ross in 6-8 weeks.  Any Other Special Instructions Will Be Listed Below (If Applicable). I will give you a prescription for compression stockings that you can get at a medical supply store. Drink plenty of water. You can even drink some Gatorade/Powerade.  You want your urine to look clear. It is ok to add salt to your foods. Raise the head of your bed so that you do not have as far to stand up after being in bed all night. Pump your calf muscles before you stand up. You may want to get some compression pants or "Spanx" to where.  If you need a refill on your cardiac medications before your next appointment, please call your pharmacy.

## 2016-05-21 ENCOUNTER — Telehealth: Payer: Self-pay | Admitting: *Deleted

## 2016-05-21 LAB — BASIC METABOLIC PANEL
BUN: 14 mg/dL (ref 7–25)
CHLORIDE: 111 mmol/L — AB (ref 98–110)
CO2: 22 mmol/L (ref 20–31)
Calcium: 8.7 mg/dL (ref 8.6–10.2)
Creat: 0.83 mg/dL (ref 0.50–1.10)
GLUCOSE: 79 mg/dL (ref 65–99)
Potassium: 4.4 mmol/L (ref 3.5–5.3)
SODIUM: 143 mmol/L (ref 135–146)

## 2016-05-21 NOTE — Telephone Encounter (Signed)
Pt is returning your call

## 2016-05-21 NOTE — Telephone Encounter (Signed)
Pt notified of lab results by phone with verbal understanding.  

## 2016-05-21 NOTE — Telephone Encounter (Signed)
Pt notified of lab results and that BMET not back yet. Pt aware we will call when BMET results are in. Pt verbalized understanding.

## 2016-05-25 ENCOUNTER — Ambulatory Visit (INDEPENDENT_AMBULATORY_CARE_PROVIDER_SITE_OTHER): Payer: Medicaid Other

## 2016-05-25 DIAGNOSIS — R002 Palpitations: Secondary | ICD-10-CM

## 2016-06-07 ENCOUNTER — Telehealth (HOSPITAL_COMMUNITY): Payer: Self-pay | Admitting: *Deleted

## 2016-06-07 NOTE — Telephone Encounter (Signed)
Left message on voicemail in reference to upcoming appointment scheduled for 06/13/16. Phone number given for a call back so details instructions can be given. Sharon S Brooks ° ° °

## 2016-06-13 ENCOUNTER — Other Ambulatory Visit (HOSPITAL_COMMUNITY): Payer: Self-pay

## 2016-06-15 ENCOUNTER — Other Ambulatory Visit: Payer: Self-pay | Admitting: *Deleted

## 2016-06-15 ENCOUNTER — Telehealth: Payer: Self-pay | Admitting: *Deleted

## 2016-06-15 DIAGNOSIS — R000 Tachycardia, unspecified: Secondary | ICD-10-CM

## 2016-06-15 NOTE — Telephone Encounter (Signed)
S/w pt to let he know that her insurance carrier denied the Stress Echo. Pt aware per Bing NeighborsScott W. PA that we will do 2 separate test, a 2  Echo and GXT. Pt aware [facility] to schedule these test. Pt was advised by phone for GXT she will need to hold her morning dose of Metoprolol. Pt is agreeable to plan of care and verbalized understanding to all instructions.

## 2016-06-25 ENCOUNTER — Ambulatory Visit: Payer: Medicaid Other | Admitting: Obstetrics & Gynecology

## 2016-07-02 ENCOUNTER — Ambulatory Visit: Payer: Self-pay | Admitting: Internal Medicine

## 2016-07-02 ENCOUNTER — Encounter: Payer: Self-pay | Admitting: Physician Assistant

## 2016-07-03 ENCOUNTER — Ambulatory Visit: Payer: Medicaid Other | Admitting: Obstetrics & Gynecology

## 2016-07-03 ENCOUNTER — Telehealth: Payer: Self-pay | Admitting: *Deleted

## 2016-07-03 NOTE — Telephone Encounter (Signed)
Pt notified of monitor results by phone with verbal understanding. Pt said she has [facility] [place]. She could not remember PCP name. I called the office of PCP [phone] and she see's Katie Hemberg, ANP. I will fax monitor results per Bing NeighborsScott W. PA to PCP fax # [phone].

## 2016-07-05 ENCOUNTER — Ambulatory Visit (INDEPENDENT_AMBULATORY_CARE_PROVIDER_SITE_OTHER): Payer: Medicaid Other

## 2016-07-05 ENCOUNTER — Other Ambulatory Visit: Payer: Self-pay

## 2016-07-05 ENCOUNTER — Ambulatory Visit (HOSPITAL_COMMUNITY): Payer: Medicaid Other | Attending: Internal Medicine

## 2016-07-05 ENCOUNTER — Other Ambulatory Visit (HOSPITAL_COMMUNITY): Payer: Self-pay

## 2016-07-05 DIAGNOSIS — R000 Tachycardia, unspecified: Secondary | ICD-10-CM | POA: Insufficient documentation

## 2016-07-05 LAB — EXERCISE TOLERANCE TEST
CHL RATE OF PERCEIVED EXERTION: 17
CSEPED: 5 min
CSEPEDS: 0 s
CSEPEW: 7 METS
CSEPHR: 97 %
MPHR: 188 {beats}/min
Peak HR: 184 {beats}/min
Rest HR: 104 {beats}/min

## 2016-07-06 ENCOUNTER — Telehealth: Payer: Self-pay | Admitting: *Deleted

## 2016-07-06 ENCOUNTER — Encounter: Payer: Self-pay | Admitting: Physician Assistant

## 2016-07-06 NOTE — Telephone Encounter (Signed)
Pt notified of GXT results by phone with verbal understanding to plan of care.

## 2016-07-12 ENCOUNTER — Encounter: Payer: Self-pay | Admitting: Physician Assistant

## 2016-07-13 ENCOUNTER — Telehealth: Payer: Self-pay | Admitting: *Deleted

## 2016-07-13 NOTE — Telephone Encounter (Signed)
Pt notified of echo results by phone with verbal understanding. I will fax a copy of results to PCP.  

## 2016-07-16 ENCOUNTER — Ambulatory Visit (INDEPENDENT_AMBULATORY_CARE_PROVIDER_SITE_OTHER): Payer: Medicaid Other | Admitting: Internal Medicine

## 2016-07-16 ENCOUNTER — Encounter (INDEPENDENT_AMBULATORY_CARE_PROVIDER_SITE_OTHER): Payer: Self-pay

## 2016-07-16 VITALS — BP 124/82 | HR 95 | Ht 61.0 in | Wt 197.8 lb

## 2016-07-16 DIAGNOSIS — G909 Disorder of the autonomic nervous system, unspecified: Secondary | ICD-10-CM

## 2016-07-16 DIAGNOSIS — G901 Familial dysautonomia [Riley-Day]: Secondary | ICD-10-CM

## 2016-07-16 LAB — URINALYSIS
Bilirubin, UA: NEGATIVE
Glucose, UA: NEGATIVE
KETONES UA: NEGATIVE
LEUKOCYTES UA: NEGATIVE
Nitrite, UA: NEGATIVE
PROTEIN UA: NEGATIVE
SPEC GRAV UA: 1.005 (ref 1.005–1.030)
Urobilinogen, Ur: 0.2 mg/dL (ref 0.2–1.0)
pH, UA: 7 (ref 5.0–7.5)

## 2016-07-16 MED ORDER — PINDOLOL 5 MG PO TABS: 2.5000 mg | ORAL_TABLET | Freq: Two times a day (BID) | ORAL | 3 refills | Status: DC

## 2016-07-16 NOTE — Progress Notes (Signed)
Cardiology Office Note   Date:  07/16/2016   ID:  Janaia [name], DOB [birth year], MRN [id number]  PCP:  Rebecka Apley, RN  Cardiologist:   Dietrich Pates, MD    F/U of dysautonomia       History of Present Illness: [name] [name] is a 31 y.o. female with a history of autonomic dysfunciton  I saw her in March 2016   Since I saw her she continues to have dizziness, palpitations   She was seen by Wende Mott in December 2017  Complained of CP and palpitations  Dysnea with chest smptoms  Had started a new job He recomm increased fluid intake, increased salt intake  Event monitor set up  Pt also set up for stress test  Echo was normal   Stress test:  Pt exercised 2.4 min with peak HR of 184  Stopped due to fatigue    The pt says she is drinking water, trying to add salt  Does not exercise regularly Note she does not have water bottle with her     Current Meds  Medication Sig  . clonazePAM (KLONOPIN) 0.5 MG tablet Take 0.5 mg by mouth daily as needed for anxiety.  . diphenhydrAMINE (BENADRYL) 25 mg capsule Take 25 mg by mouth every 6 (six) hours as needed for itching.  Marland Kitchen EPINEPHrine 0.3 mg/0.3 mL IJ SOAJ injection Inject 0.3 mg into the muscle as needed.   . megestrol (MEGACE) 40 MG tablet 3 tablets a day for 5 days, 2 tablets a day for 5 days then 1 tablet daily  . metoprolol tartrate (LOPRESSOR) 25 MG tablet Take 0.5 tablets (12.5 mg total) by mouth 2 (two) times daily as needed (PALPITATIONS).  . prazosin (MINIPRESS) 1 MG capsule Take 1 mg by mouth daily.     Allergies:   Vancomycin; Isoflavones; Shellfish allergy; Other; and Soy allergy   Past Medical History:  Diagnosis Date  . Anxiety   . Depression   . DUB (dysfunctional uterine bleeding)   . Dysautonomia   . History of echocardiogram    Echo 2/18: EF 60-65, no RWMA  . History of exercise stress test    a. ETT 1/18: Ex 5', no ischemic changes, no arrhythmias.  . Palpitations    Event Monitor 12/17: NSR, no  dysrhythmias  . Panic attacks   . POTS (postural orthostatic tachycardia syndrome)   . Schizoaffective disorder, bipolar type (HCC)    With PTSD  . SVT (supraventricular tachycardia) (HCC) 2004   S/P slow path modification    Past Surgical History:  Procedure Laterality Date  . CESAREAN SECTION    . TUBAL LIGATION    . VENTRICULAR ABLATION SURGERY     For supraventricular tachycardia     Social History:  The patient  reports that she has never smoked. She has never used smokeless tobacco. She reports that she does not drink alcohol or use drugs.   Family History:  The patient's family history includes Depression in her other; Diabetes in her mother and other; Heart disease in her mother; Heart failure in her father and mother; Hypertension in her mother and other.    ROS:  Please see the history of present illness. All other systems are reviewed and  Negative to the above problem except as noted.    PHYSICAL EXAM: VS:  BP 124/82   Pulse 95   Ht 5\' 1"  (1.549 m)   Wt 197 lb 12.8 oz (89.7 kg)   BMI 37.37 kg/m  GEN: Well nourished, well developed, in no acute distress  HEENT: normal  Neck: no JVD, carotid bruits, or masses Cardiac: RRR; no murmurs, rubs, or gallops,no edema  Respiratory:  clear to auscultation bilaterally, normal work of breathing GI: soft, nontender, nondistended, + BS  No hepatomegaly  MS: no deformity Moving all extremities   Skin: warm and dry, no rash Neuro:  Strength and sensation are intact Psych: euthymic mood, full affect   EKG:  EKG is not  ordered today.   Lipid Panel    Component Value Date/Time   CHOL 151 08/08/2012 0934   TRIG 101.0 08/08/2012 0934   HDL 45.70 08/08/2012 0934   CHOLHDL 3 08/08/2012 0934   VLDL 20.2 08/08/2012 0934   LDLCALC 85 08/08/2012 0934      Wt Readings from Last 3 Encounters:  07/16/16 197 lb 12.8 oz (89.7 kg)  05/18/16 197 lb (89.4 kg)  02/24/16 192 lb (87.1 kg)      ASSESSMENT AND PLAN: 1   Autonomic dysfunciton    BP maintained today on standing but HR increases   Marked increase in HR with treadmill I would recomm pindolol 2.5 bid  Stay active Increase fluid/salt intake  Keep fluid with pt Check UA    Current medicines are reviewed at length with the patient today.  The patient does not have concerns regarding medicines.  Signed, Dietrich PatesPaula Hai Grabe, MD  07/16/2016 6:07 PM    [facility] [address], [place], [place]  [address] Phone: [phone](336) [phone]; Fax: [phone]

## 2016-07-16 NOTE — Patient Instructions (Signed)
Your physician has recommended you make the following change in your medication:  1.) pindolol 5 mg--take 1/2 tablet two times a day  Your physician recommends that you return to lab for urinalysis.  Increase your water intake.  Try to keep a bottle of water with you all day to drink.

## 2016-07-17 ENCOUNTER — Telehealth: Payer: Self-pay | Admitting: *Deleted

## 2016-07-17 NOTE — Telephone Encounter (Signed)
called regarding pindolol, pt stated the pharmacy said that her insurance would not cover this medication. spoke w/chris, pt needs to submit to primary insurance and needs a PA, will inform pt.   called to inform pt that she will have to submitt primary insurance before her medicaid per pharmacy. she does not have any other insurance other then medicaid, will give the PA information to [place] and send a message to Dr Charlott Rakesoss's RN.

## 2016-07-18 ENCOUNTER — Telehealth: Payer: Self-pay

## 2016-07-18 NOTE — Telephone Encounter (Signed)
Prior auth for Pindolol 5mg  obtained from [employer]. [id number] and is valid through 07/13/2017. Local pharmacy notified.

## 2016-07-18 NOTE — Telephone Encounter (Signed)
PA obtained today for her Pindolol. Local pharmacy notified.

## 2016-08-05 NOTE — Progress Notes (Signed)
Cardiology Office Note   Date:  08/06/2016   ID:  [name] Skeansmanda Burchill, DOB [birth year], MRN [id number]  PCP:  Rebecka ApleyHEMBERG, KATHERINE V, NP  Cardiologist:   Dietrich PatesPaula Analiah Drum, MD   F/U of autonomic dysfunction   History of Present Illness: [name] [name] is a 31 y.o. female with a history of autonomic dysfunciton  I saw her on Jul 16 2016  Wende MottS Weaver had seen prior  Echo normal  Stress test walked only 2.4 min  Peak HR 184   When I saw her I recomm fluid and salt   Added pindolol to regime 2.5 bid    Still with dizzienss  Some days better than others  No syncope  Heart rate does bump up with activity    Current Meds  Medication Sig  . clonazePAM (KLONOPIN) 0.5 MG tablet Take 0.5 mg by mouth daily as needed for anxiety.  . diphenhydrAMINE (BENADRYL) 25 mg capsule Take 25 mg by mouth every 6 (six) hours as needed for itching.  Marland Kitchen. EPINEPHrine 0.3 mg/0.3 mL IJ SOAJ injection Inject 0.3 mg into the muscle as needed.   . megestrol (MEGACE) 40 MG tablet 3 tablets a day for 5 days, 2 tablets a day for 5 days then 1 tablet daily  . metoprolol tartrate (LOPRESSOR) 25 MG tablet Take 0.5 tablets (12.5 mg total) by mouth 2 (two) times daily as needed (PALPITATIONS).  . pindolol (VISKEN) 5 MG tablet Take 0.5 tablets (2.5 mg total) by mouth 2 (two) times daily.  . prazosin (MINIPRESS) 1 MG capsule Take 1 mg by mouth daily.     Allergies:   Vancomycin; Isoflavones; Shellfish allergy; Other; and Soy allergy   Past Medical History:  Diagnosis Date  . Anxiety   . Depression   . DUB (dysfunctional uterine bleeding)   . Dysautonomia   . History of echocardiogram    Echo 2/18: EF 60-65, no RWMA  . History of exercise stress test    a. ETT 1/18: Ex 5', no ischemic changes, no arrhythmias.  . Palpitations    Event Monitor 12/17: NSR, no dysrhythmias  . Panic attacks   . POTS (postural orthostatic tachycardia syndrome)   . Schizoaffective disorder, bipolar type (HCC)    With PTSD  . SVT (supraventricular  tachycardia) (HCC) 2004   S/P slow path modification    Past Surgical History:  Procedure Laterality Date  . CESAREAN SECTION    . TUBAL LIGATION    . VENTRICULAR ABLATION SURGERY     For supraventricular tachycardia     Social History:  The patient  reports that she has never smoked. She has never used smokeless tobacco. She reports that she does not drink alcohol or use drugs.   Family History:  The patient's family history includes Depression in her other; Diabetes in her mother and other; Heart disease in her mother; Heart failure in her father and mother; Hypertension in her mother and other.    ROS:  Please see the history of present illness. All other systems are reviewed and  Negative to the above problem except as noted.    PHYSICAL EXAM: VS:  BP 118/76   Pulse 85   Ht 5\' 1"  (1.549 m)   Wt 198 lb 12.8 oz (90.2 kg)   BMI 37.56 kg/m   GEN: Well nourished, well developed, in no acute distress  HEENT: normal  Neck: no JVD, carotid bruits, or masses Cardiac: RRR; no murmurs, rubs, or gallops,no edema  Respiratory:  clear to auscultation  bilaterally, normal work of breathing GI: soft, nontender, nondistended, + BS  No hepatomegaly  MS: no deformity Moving all extremities   Skin: warm and dry, no rash Neuro:  Strength and sensation are intact Psych: euthymic mood, full affect   EKG:  EKG is not ordered today.   Lipid Panel    Component Value Date/Time   CHOL 151 08/08/2012 0934   TRIG 101.0 08/08/2012 0934   HDL 45.70 08/08/2012 0934   CHOLHDL 3 08/08/2012 0934   VLDL 20.2 08/08/2012 0934   LDLCALC 85 08/08/2012 0934      Wt Readings from Last 3 Encounters:  08/06/16 198 lb 12.8 oz (90.2 kg)  07/16/16 197 lb 12.8 oz (89.7 kg)  05/18/16 197 lb (89.4 kg)      ASSESSMENT AND PLAN:  1  Autonomic dysfunction  BP with from 120/78 lying to 100/80 standing  HR 94 to 109    She was symptaomtic   I would recomm Florinef 0.05 at 8 and 2  Continue pindolol    Fluids!!!  Salt  Elevate HOB  Small meals  Abdominal binder  Stay active with walking and resistance training    BMET in 10 days   F/U in clinic in 4 to 6 wks     Current medicines are reviewed at length with the patient today.  The patient does not have concerns regarding medicines.  Signed, Dietrich Pates, MD  08/06/2016 4:55 PM    [facility] [address] [place], [place], [place]  [address] Phone: [phone]; Fax: [phone]

## 2016-08-06 ENCOUNTER — Encounter: Payer: Self-pay | Admitting: Internal Medicine

## 2016-08-06 ENCOUNTER — Encounter (INDEPENDENT_AMBULATORY_CARE_PROVIDER_SITE_OTHER): Payer: Self-pay

## 2016-08-06 ENCOUNTER — Ambulatory Visit (INDEPENDENT_AMBULATORY_CARE_PROVIDER_SITE_OTHER): Payer: Medicaid Other | Admitting: Internal Medicine

## 2016-08-06 VITALS — BP 118/76 | HR 85 | Ht 61.0 in | Wt 198.8 lb

## 2016-08-06 DIAGNOSIS — G909 Disorder of the autonomic nervous system, unspecified: Secondary | ICD-10-CM

## 2016-08-06 DIAGNOSIS — G901 Familial dysautonomia [Riley-Day]: Secondary | ICD-10-CM

## 2016-08-06 MED ORDER — FLUDROCORTISONE ACETATE 0.1 MG PO TABS: 0.0500 mg | ORAL_TABLET | Freq: Two times a day (BID) | ORAL | 3 refills | Status: DC

## 2016-08-06 NOTE — Patient Instructions (Signed)
Your physician has recommended you make the following change in your medication:  1.) florinef 0.1 mg --take 1/2 tablet by mouth at 8am and 2 pm  Your physician recommends that you return for lab work in: 10 days (BMET)  Your physician recommends that you schedule a follow-up appointment in: 4-6 weeks with Dr. Tenny Crawoss.

## 2016-08-16 ENCOUNTER — Other Ambulatory Visit: Payer: Medicaid Other

## 2016-09-21 ENCOUNTER — Ambulatory Visit: Payer: Medicaid Other | Admitting: Internal Medicine

## 2017-01-22 ENCOUNTER — Ambulatory Visit: Payer: Medicaid Other | Attending: Orthopedic Surgery | Admitting: Physical Therapy

## 2017-01-22 DIAGNOSIS — R262 Difficulty in walking, not elsewhere classified: Secondary | ICD-10-CM | POA: Insufficient documentation

## 2017-01-22 DIAGNOSIS — M25571 Pain in right ankle and joints of right foot: Secondary | ICD-10-CM | POA: Insufficient documentation

## 2017-02-04 ENCOUNTER — Ambulatory Visit: Payer: Medicaid Other | Admitting: Physical Therapy

## 2017-02-04 ENCOUNTER — Encounter: Payer: Self-pay | Admitting: Physical Therapy

## 2017-02-04 DIAGNOSIS — M25571 Pain in right ankle and joints of right foot: Secondary | ICD-10-CM | POA: Diagnosis present

## 2017-02-04 DIAGNOSIS — R262 Difficulty in walking, not elsewhere classified: Secondary | ICD-10-CM

## 2017-02-04 NOTE — Patient Instructions (Signed)
  Perform all 4 four direction: up/down/to each side 15-20 reps 1-2 set 2-3 times a day   Write your ABC's  2-3 times a day  15-20 times  2-3 times a day   Same as above

## 2017-02-04 NOTE — Therapy (Signed)
[facility] [address] [place], [place], [address] Phone: [phone]   Fax:  [phone]  Physical Therapy Evaluation  Patient Details  Name: [name] [name] MRN: [id number] Date of Birth: [birth year] Referring Provider: Arturo Morton, MD  Encounter Date: 02/04/2017      PT End of Session - 02/04/17 1344    Visit Number 1   Number of Visits 1   Authorization Type Medicaid, one eval due to fiancial restraints   PT Start Time 0906   PT Stop Time 0950   PT Time Calculation (min) 44 min   Activity Tolerance Patient tolerated treatment well   Behavior During Therapy [facility] for tasks assessed/performed      Past Medical History:  Diagnosis Date  . Anxiety   . Depression   . DUB (dysfunctional uterine bleeding)   . Dysautonomia   . History of echocardiogram    Echo 2/18: EF 60-65, no RWMA  . History of exercise stress test    a. ETT 1/18: Ex 5', no ischemic changes, no arrhythmias.  . Palpitations    Event Monitor 12/17: NSR, no dysrhythmias  . Panic attacks   . POTS (postural orthostatic tachycardia syndrome)   . Schizoaffective disorder, bipolar type (HCC)    With PTSD  . SVT (supraventricular tachycardia) (HCC) 2004   S/P slow path modification    Past Surgical History:  Procedure Laterality Date  . CESAREAN SECTION    . TUBAL LIGATION    . VENTRICULAR ABLATION SURGERY     For supraventricular tachycardia    There were no vitals filed for this visit.       Subjective Assessment - 02/04/17 1332    Subjective Patient presenting today to OPPT with c/o pain in her Right ankle and "cracking" in her right knee. Pt reporting multiple falls in the last few months. Pt reported falling in her yard about 1 month ago where she twisted her right ankle. Pt with weakness noted in Right ankle inversion and eversion compared to left. Pt also noted with R leg length discrepancy. Pt was issued a script for an orthotic evaluation. Due to  financial restraints pt only seen for one PT visit.    How long can you sit comfortably? unlimited   Patient Stated Goals Get out of pain, stop falling   Currently in Pain? Yes   Pain Score 4    Pain Location Ankle   Pain Orientation Right   Pain Descriptors / Indicators Aching;Throbbing;Sore   Pain Type Acute pain   Pain Onset More than a month ago   Pain Frequency Intermittent            [facility] PT Assessment - 02/04/17 0001      Assessment   Medical Diagnosis s/p fall, R ankle/foot pain and R knee pain   Referring Provider Arturo Morton, MD   Onset Date/Surgical Date 12/18/16  estimated date of fall   Hand Dominance Right   Prior Therapy yes, for back issues     Precautions   Precautions None     Restrictions   Weight Bearing Restrictions No     Balance Screen   Has the patient fallen in the past 6 months Yes   How many times? multiple   Has the patient had a decrease in activity level because of a fear of falling?  Yes   Is the patient reluctant to leave their home because of a fear of falling?  Yes     Home Environment  Living Environment Private residence   Living Arrangements Spouse/significant other;Children   Home Access Stairs to enter   Entrance Stairs-Number of Steps 3   Entrance Stairs-Rails None     Prior Function   Level of Independence Independent   Vocation Unemployed     Posture/Postural Control   Posture/Postural Control Postural limitations   Posture Comments Right posterior pelvic rotation.     ROM / Strength   AROM / PROM / Strength Strength     AROM   Overall AROM Comments bilateral ankle and knee ROM WLF     Strength   Overall Strength Deficits   Strength Assessment Site Ankle   Right/Left Ankle Right   Right Ankle Dorsiflexion 4+/5   Right Ankle Plantar Flexion 4+/5   Right Ankle Inversion 3+/5   Right Ankle Eversion 3+/5     Palpation   Palpation comment Diffuse pain over 2nd, and 3rd metartasals on R foot, Pt also reporting R  knee crepitus     Special Tests   Leg length test  --  R: 81.75 centimeters , L: 83.5 centimeters     Transfers   Transfers Sit to Stand   Sit to Stand 7: Independent     Ambulation/Gait   Gait Comments increased supination of R foot/ankle with mild antalgic gait             Objective measurements completed on examination: See above findings.                  PT Education - 02/04/17 1344    Education provided Yes   Education Details HEP   Person(s) Educated Patient   Methods Explanation;Demonstration;Handout;Verbal cues   Comprehension Verbalized understanding;Returned demonstration          PT Short Term Goals - 02/04/17 1353      PT SHORT TERM GOAL #1   Title Pt will be independent in her initial HEP.    Time 1   Period Days   Status Achieved           PT Long Term Goals - 05/09/16 1745      PT LONG TERM GOAL #1   Title Eval only.                Plan - 02/04/17 1348    Clinical Impression Statement Pt arriving to therapy complaining of right ankle pain and right knee "cracking" (crepitus). Pt reporting history of several falls over the last few months. Pt with Right leg length discrepancy and weakness in right ankle.  Pt was issued a HEP for ankle ROM and strengthening. Due to financial restraints pt is only being seen for an evaluation and HEP.    Clinical Presentation Stable   Clinical Decision Making Low   Rehab Potential Good   PT Frequency --  one time visit    PT Treatment/Interventions ADLs/Self Care Home Management;Therapeutic exercise;Patient/family education;Manual techniques   PT Next Visit Plan only one visit due to financial restraints   PT Home Exercise Plan HEP, (see pt instructions)   Consulted and Agree with Plan of Care Patient      Patient will benefit from skilled therapeutic intervention in order to improve the following deficits and impairments:  Abnormal gait, Decreased strength, Pain, Difficulty  walking, Decreased range of motion  Visit Diagnosis: Pain in right ankle and joints of right foot  Difficulty in walking, not elsewhere classified     Problem List Patient Active Problem List   Diagnosis Date  Noted  . Schizoaffective disorder, bipolar type (HCC)   . Obesity (BMI 35.0-39.9 without comorbidity) 02/24/2014  . Dysautonomia 12/21/2010  . 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 UNSPECIFIED HYPOTENSION 02/23/2009  . Tachycardia 02/23/2009    Sharmon Leyden, MPT 02/04/2017, 1:55 PM  [facility] [address] [place], [place], [address] Phone: [phone]   Fax:  [phone]  Name: Annora Guderian MRN: [id number] Date of Birth: 1985/08/20

## 2017-02-06 ENCOUNTER — Ambulatory Visit (INDEPENDENT_AMBULATORY_CARE_PROVIDER_SITE_OTHER): Payer: Medicaid Other | Admitting: Neurology

## 2017-02-06 ENCOUNTER — Encounter: Payer: Self-pay | Admitting: Neurology

## 2017-02-06 ENCOUNTER — Other Ambulatory Visit: Payer: Self-pay | Admitting: Internal Medicine

## 2017-02-06 VITALS — BP 134/84 | HR 94 | Resp 18 | Ht 61.0 in | Wt 210.5 lb

## 2017-02-06 DIAGNOSIS — G901 Familial dysautonomia [Riley-Day]: Secondary | ICD-10-CM

## 2017-02-06 DIAGNOSIS — R55 Syncope and collapse: Secondary | ICD-10-CM | POA: Diagnosis not present

## 2017-02-06 DIAGNOSIS — G909 Disorder of the autonomic nervous system, unspecified: Secondary | ICD-10-CM | POA: Diagnosis not present

## 2017-02-06 DIAGNOSIS — R200 Anesthesia of skin: Secondary | ICD-10-CM | POA: Diagnosis not present

## 2017-02-06 DIAGNOSIS — G4700 Insomnia, unspecified: Secondary | ICD-10-CM | POA: Diagnosis not present

## 2017-02-06 MED ORDER — TRAZODONE HCL 50 MG PO TABS: 50.0000 mg | ORAL_TABLET | Freq: Every day | ORAL | 5 refills | Status: DC

## 2017-02-06 NOTE — Progress Notes (Signed)
[facility]  PATIENT: [name] [name] DOB: 05-16-1986  REFERRING DOCTOR OR PCP:  Sharon Seller, NP SOURCE: Patient, notes from Ms. Hamburg, imaging and laboratory reports and spine x-ray images reviewed on PACS  _________________________________   HISTORICAL  CHIEF COMPLAINT:  Chief Complaint  Patient presents with  . Decreased Visual Acuity    Shoni is here for eval of several episodes of visual disturbance, pre-syncope. Orthostatic VS done today.  Sts. dx. with POTS several yrs. ago, also has hx. of SVT.  Normal MRI c-spine in Nov. 2017. Normal holter monitor in Dec. 2017. Normal stress test Jan. 2018. Orthostatic VS done today/fim  . Presyncope    HISTORY OF PRESENT ILLNESS:  I had the pleasure seeing you patient, [name] [name], at [facility] for neurologic consultation regarding her episodes of visual disturbance, numbness, and 2 episodes of possible syncope.  She is a 31 year old woman who has had visual disturbances and lightheadedness for many years.   She was diagnosed with POTS 7 years ago in 2011 after her son's birth.   She would get off balanced with standing and a cardiologist did orthostatic BP.   They started fludrocortisone and a beta blocker.   She was also diagnosed with PSVT (180-200 bpm a couple times) and once received adenosine.   She has been off fludrocortisone a couple years now.   She gained weight while on it.    Holter monitor 05/2016 and stress test 06/2016 were normal though her heart rate went up to 190.    She notes that 4 weeks ago while driving, she felt a jolt sensation in her heads and then noted vision went black for a second followed by changes like looking through water.    She felt she was going to pass out.   She got home and slept and felt better when she woke up.   She had a second episode, also while driving, last week that was very similar.     She also notes more memory loss and confusion the  past year.     Sleep is poor some nights, often because her mind does not shut down.    She does not snore.    She has mood problems and is very irritable.    Lexapro was recently started with some improvement.        Sometimes she feels a weird trembling sensation.   She also has had some episodes of numbness in her legs.   She also has neck, upper back and lower back pain at times.  She was once diagnosed with a occipital neuralgia.  Her pupils will sometimes oscillate even without changes in light.   No eye pain.    REVIEW OF SYSTEMS: Constitutional: No fevers, chills, sweats, or change in appetite.  Notes fatigue and poor sleep. Eyes: as aboveasal congestion, sore throat Cardiovascular: No chest pain, palpitations Respiratory: No shortness of breath at rest or with exertion.   No wheezes GastrointestinaI: No nausea, vomiting, diarrhea, abdominal pain, fecal incontinence Genitourinary: No dysuria, urinary retention or frequency.  No nocturia. Musculoskeletal: Notes pain in the upper and lower back and neck.   Integumentary: No rash, pruritus, skin lesions Neurological: as above Psychiatric: No depression at this time.  No anxiety Endocrine: No palpitations, diaphoresis, change in appetite, change in weigh or increased thirst Hematologic/Lymphatic: No anemia, purpura, petechiae. Allergic/Immunologic: No itchy/runny eyes, nasal congestion, recent allergic reactions, rashes  ALLERGIES: Allergies  Allergen Reactions  . 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 Isoflavones Hives  . Shellfish Allergy Hives  . Other Hives  . Soy Allergy Hives    HOME MEDICATIONS:  Current Outpatient Prescriptions:  .  diphenhydrAMINE (BENADRYL) 25 mg capsule, Take 25 mg by mouth every 6 (six) hours as needed for itching., Disp: , Rfl:  .   escitalopram (LEXAPRO) 5 MG tablet, Take 5 mg by mouth daily., Disp: , Rfl:  .  EPINEPHrine 0.3 mg/0.3 mL IJ SOAJ injection, Inject 0.3 mg into the muscle as needed. , Disp: , Rfl:  .  fludrocortisone (FLORINEF) 0.1 MG tablet, Take 0.5 tablets (0.05 mg total) by mouth 2 (two) times daily. 8 am and 2 pm (Patient not taking: Reported on 02/04/2017), Disp: 45 tablet, Rfl: 3 .  pindolol (VISKEN) 5 MG tablet, Take 0.5 tablets (2.5 mg total) by mouth 2 (two) times daily. (Patient not taking: Reported on 02/04/2017), Disp: 90 tablet, Rfl: 3  PAST MEDICAL HISTORY: Past Medical History:  Diagnosis Date  . Anxiety   . Depression   . DUB (dysfunctional uterine bleeding)   . Dysautonomia   . History of echocardiogram    Echo 2/18: EF 60-65, no RWMA  . History of exercise stress test    a. ETT 1/18: Ex 5', no ischemic changes, no arrhythmias.  . Palpitations    Event Monitor 12/17: NSR, no dysrhythmias  . Panic attacks   . POTS (postural orthostatic tachycardia syndrome)   . Schizoaffective disorder, bipolar type (HCC)    With PTSD  . SVT (supraventricular tachycardia) (HCC) 2004   S/P slow path modification    PAST SURGICAL HISTORY: Past Surgical History:  Procedure Laterality Date  . CESAREAN SECTION    . TUBAL LIGATION    . VENTRICULAR ABLATION SURGERY     For supraventricular tachycardia    FAMILY HISTORY: Family History  Problem Relation Age of Onset  . Heart failure Mother   . Heart disease Mother   . Hypertension Mother   . Diabetes Mother   . Heart failure Father   . Hypertension Other   . Diabetes Other   . Depression Other     SOCIAL HISTORY:  Social History   Social History  . Marital status: Married    Spouse name: N/A  . Number of children: N/A  . Years of education: N/A   Occupational History  . Not on file.   Social History Main Topics  . Smoking status: Never Smoker  . Smokeless tobacco: Never Used  . Alcohol use No     Comment: Denies  . Drug use:  No     Comment: Denies  . Sexual activity: Yes    Partners: Male    Birth control/ protection: Surgical   Other Topics Concern  . Not on file   Social History Narrative  . No narrative on file     PHYSICAL EXAM  Vitals:   02/06/17 1033  BP: 134/84  Pulse: 94  Resp: 18  Weight: 210 lb 8 oz (95.5 kg)  Height: 5\' 1"  (1.549 m)    Body mass index is 39.77 kg/m.  Orthostatic VS for the past 24 hrs:  BP- Lying Pulse- Lying BP- Sitting Pulse- Sitting BP- Standing at 0 minutes Pulse- Standing at 0 minutes  02/06/17 1037 134/84 94 117/83 83 123/85 100  General: The patient is well-developed and well-nourished and in no acute distress  Eyes:  Funduscopic exam shows normal optic discs and retinal vessels.  Neck: The neck is supple, no carotid bruits are noted.  The neck is mildly tender over the occiput. i Cardiovascular: The heart has a regular rate and rhythm with a normal S1 and S2. There were no murmurs, gallops or rubs. Lungs are clear to auscultation.  Skin: Extremities are without significant edema.  Musculoskeletal:  Back is nontender.   Right ankle is tender and she is wearing a soft brace.     Neurologic Exam  Mental status: The patient is alert and oriented x 3 at the time of the examination. The patient has apparent normal recent and remote memory, with an apparently normal attention span and concentration ability.   Speech is normal.  Cranial nerves: Extraocular movements are full. Pupils are equal, round, and reactive to light and accomodation.  Visual fields are full.  Facial symmetry is present. There is good facial sensation to soft touch bilaterally.Facial strength is normal.  Trapezius and sternocleidomastoid strength is normal. No dysarthria is noted.  The tongue is midline, and the patient has symmetric elevation of the soft palate. No obvious hearing deficits are noted.  Motor:  Muscle bulk is normal.   Tone is normal. Strength is  5 / 5 in all 4  extremities.   Sensory: Sensory testing is intact to pinprick, soft touch and vibration sensation in all 4 extremities.  Coordination: Cerebellar testing reveals good finger-nose-finger and heel-to-shin bilaterally.  Gait and station: Station is normal.   Gait is normal. Tandem gait is normal. Romberg is negative.   Reflexes: Deep tendon reflexes are symmetric and normal bilaterally.   Plantar responses are flexor.    DIAGNOSTIC DATA (LABS, IMAGING, TESTING) - I reviewed patient records, labs, notes, testing and imaging myself where available.  Lab Results  Component Value Date   WBC 11.4 (H) 04/02/2015   HGB 13.7 04/02/2015   HCT 41.7 04/02/2015   MCV 89.3 04/02/2015   PLT 371 04/02/2015      Component Value Date/Time   NA 143 05/18/2016 1004   K 4.4 05/18/2016 1004   CL 111 (H) 05/18/2016 1004   CO2 22 05/18/2016 1004   GLUCOSE 79 05/18/2016 1004   BUN 14 05/18/2016 1004   CREATININE 0.83 05/18/2016 1004   CALCIUM 8.7 05/18/2016 1004   PROT 8.2 (H) 04/02/2015 2000   ALBUMIN 4.4 04/02/2015 2000   AST 23 04/02/2015 2000   ALT 24 04/02/2015 2000   ALKPHOS 86 04/02/2015 2000   BILITOT 0.5 04/02/2015 2000   GFRNONAA >60 04/02/2015 2000   GFRAA >60 04/02/2015 2000   Lab Results  Component Value Date   CHOL 151 08/08/2012   HDL 45.70 08/08/2012   LDLCALC 85 08/08/2012   TRIG 101.0 08/08/2012   CHOLHDL 3 08/08/2012   No results found for: HGBA1C No results found for: VITAMINB12 Lab Results  Component Value Date   TSH 1.28 05/18/2016       ASSESSMENT AND PLAN  Numbness - Plan: MR BRAIN W WO CONTRAST, MR CERVICAL SPINE WO CONTRAST  Syncope, unspecified syncope type - Plan: CBC with Differential/Platelet, Comprehensive metabolic panel, MR BRAIN W WO CONTRAST, EEG adult  Dysautonomia - Plan: CBC with Differential/Platelet, Comprehensive metabolic panel, MR CERVICAL SPINE WO CONTRAST  Insomnia, unspecified type   1.    Because of her episodes of numbness  and visual disturbance, we need to check  an MRI of the brain to make sure that there is not an inflammatory, demyelinating or ischemic process contributing to the symptoms. Further evaluation treatment may be necessary based on the results. 2.    Due to the episodes of numbness we'll also need to check an MRI of the cervical spine to make sure that there is not a transverse myelitis, myelopathy or other disorder. Further evaluation or treatment may be necessary based of the results. 3.    CBC and CMP will be checked due to her symptoms. 4.    For insomnia, trial of trazodone. She is also advised to take OTC melatonin. 5.    EEG to determine if the 2 episodes of transient alteration of awareness are epileptiform. 6.    We will call her with the results of the imaging and EEG studies. Further evaluation and office visits will be scheduled based on the outcomes of the studies. She is advised to call us sooner if any new or worsening neurologic symptoms. 7.    Thank you for asking me to see Mrs. Edwyna Shell. Please let me know if you need any further assistance with her or other patients in the future.   Richard A. Epimenio Foot, MD, [facility] 02/06/2017, 11:12 AM Certified in Neurology, Clinical Neurophysiology, Sleep Medicine, Pain Medicine and Neuroimaging  [facility] [address], [address] [place], [place] [address] [phone]

## 2017-02-06 NOTE — Telephone Encounter (Addendum)
May we fill Florinef ? Does pt need bmet ?

## 2017-02-06 NOTE — Telephone Encounter (Signed)
New message    Pt is calling to schedule an appt. She said last time she was here she was prescribed some medication but she didn't get it because she was having issues with her medicaid. She has it fixed now and wants to know if they can be prescribed again. Please call.

## 2017-02-07 ENCOUNTER — Other Ambulatory Visit: Payer: Self-pay | Admitting: *Deleted

## 2017-02-07 DIAGNOSIS — G901 Familial dysautonomia [Riley-Day]: Secondary | ICD-10-CM

## 2017-02-07 MED ORDER — FLUDROCORTISONE ACETATE 0.1 MG PO TABS: 0.0500 mg | ORAL_TABLET | Freq: Two times a day (BID) | ORAL | 3 refills | Status: DC

## 2017-02-12 ENCOUNTER — Telehealth: Payer: Self-pay | Admitting: Internal Medicine

## 2017-02-12 NOTE — Telephone Encounter (Signed)
Mrs. [name] [name] is calling about the medication ( Florineff) . She went to pick up the medication and Medicaid states that they do not cover the medication.Wants to know if there is something else in which she can get or what steps would she need to do in order for  Medicaid to cover this medication . Please call

## 2017-02-14 MED ORDER — FLUDROCORTISONE ACETATE 0.1 MG PO TABS: 0.0500 mg | ORAL_TABLET | Freq: Two times a day (BID) | ORAL | 6 refills | Status: DC

## 2017-02-14 NOTE — Telephone Encounter (Signed)
Spoke with [facility].  Medicaid will pay if it is ordered for 30 day supply at a time.  Called patient and informed. She will pick up and start taking tomorrow.  She has f/u Dr. Tenny Crawoss on 9/17, so she will get BMET at that time.

## 2017-02-14 NOTE — Addendum Note (Signed)
Addended by: Lendon KaWILSON, Slayton Lubitz on: 02/14/2017 03:25 PM   Modules accepted: Orders

## 2017-02-15 ENCOUNTER — Other Ambulatory Visit: Payer: Self-pay

## 2017-02-17 ENCOUNTER — Other Ambulatory Visit: Payer: Self-pay

## 2017-02-19 ENCOUNTER — Telehealth: Payer: Self-pay | Admitting: Neurology

## 2017-02-19 DIAGNOSIS — R200 Anesthesia of skin: Secondary | ICD-10-CM

## 2017-02-19 DIAGNOSIS — G901 Familial dysautonomia [Riley-Day]: Secondary | ICD-10-CM

## 2017-02-19 NOTE — Telephone Encounter (Signed)
Victorino DikeJennifer with [facility] emailed me and informed that medicaid did not approve the MRI's. The phone number for the peer to peer is [phone] and the case number is [id number]. She is scheduled to have these images done Saturday 03/02/17.

## 2017-02-19 NOTE — Telephone Encounter (Signed)
Noted,  Thank you!

## 2017-02-19 NOTE — Telephone Encounter (Signed)
[employer] approved MRI brain with and without contrast, and MRI c-spine with and without contrast.  Order for c-spine changed from without contrast to with and without. Auth good from 02/18/17 thru 03/20/17.  Auth# [id number]./fim

## 2017-02-21 ENCOUNTER — Other Ambulatory Visit: Payer: Medicaid Other

## 2017-02-23 ENCOUNTER — Other Ambulatory Visit: Payer: Self-pay

## 2017-02-24 NOTE — Progress Notes (Deleted)
Cardiology Office Note   Date:  02/24/2017   ID:  [name] [name], DOB 12-16-1985, MRN [id number]  PCP:  Rebecka Apley, NP  Cardiologist:   Dietrich Pates, MD   Pt presents for follow up of dysautonomia    History of Present Illness: [name] [name] is a 31 y.o. female with a history of autonomic dysfunciton  I saw her on Jul 16 2016  Wende Mott had seen prior  Echo normal  Stress test walked only 2.4 min  Peak HR 184   When I saw her I recomm fluid and salt   Added pindolol to regime 2.5 bid    I saw her in clinic last winter    No outpatient prescriptions have been marked as taking for the 02/25/17 encounter (Appointment) with Pricilla Riffle, MD.     Allergies:   Vancomycin; Isoflavones; Shellfish allergy; Other; and Soy allergy   Past Medical History:  Diagnosis Date  . Anxiety   . Depression   . DUB (dysfunctional uterine bleeding)   . Dysautonomia   . History of echocardiogram    Echo 2/18: EF 60-65, no RWMA  . History of exercise stress test    a. ETT 1/18: Ex 5', no ischemic changes, no arrhythmias.  . Palpitations    Event Monitor 12/17: NSR, no dysrhythmias  . Panic attacks   . POTS (postural orthostatic tachycardia syndrome)   . Schizoaffective disorder, bipolar type (HCC)    With PTSD  . SVT (supraventricular tachycardia) (HCC) 2004   S/P slow path modification    Past Surgical History:  Procedure Laterality Date  . CESAREAN SECTION    . TUBAL LIGATION    . VENTRICULAR ABLATION SURGERY     For supraventricular tachycardia     Social History:  The patient  reports that she has never smoked. She has never used smokeless tobacco. She reports that she does not drink alcohol or use drugs.   Family History:  The patient's family history includes Depression in her other; Diabetes in her mother and other; Heart disease in her mother; Heart failure in her father and mother; Hypertension in her mother and other.    ROS:  Please see the history of present  illness. All other systems are reviewed and  Negative to the above problem except as noted.    PHYSICAL EXAM: VS:  There were no vitals taken for this visit.  GEN: Well nourished, well developed, in no acute distress  HEENT: normal  Neck: no JVD, carotid bruits, or masses Cardiac: RRR; no murmurs, rubs, or gallops,no edema  Respiratory:  clear to auscultation bilaterally, normal work of breathing GI: soft, nontender, nondistended, + BS  No hepatomegaly  MS: no deformity Moving all extremities   Skin: warm and dry, no rash Neuro:  Strength and sensation are intact Psych: euthymic mood, full affect   EKG:  EKG is not ordered today.   Lipid Panel    Component Value Date/Time   CHOL 151 08/08/2012 0934   TRIG 101.0 08/08/2012 0934   HDL 45.70 08/08/2012 0934   CHOLHDL 3 08/08/2012 0934   VLDL 20.2 08/08/2012 0934   LDLCALC 85 08/08/2012 0934      Wt Readings from Last 3 Encounters:  02/06/17 210 lb 8 oz (95.5 kg)  08/06/16 198 lb 12.8 oz (90.2 kg)  07/16/16 197 lb 12.8 oz (89.7 kg)      ASSESSMENT AND PLAN:  1  Autonomic dysfunction  BP with from 120/78 lying to  100/80 standing  HR 94 to 109    She was symptaomtic   I would recomm Florinef 0.05 at 8 and 2  Continue pindolol   Fluids!!!  Salt  Elevate HOB  Small meals  Abdominal binder  Stay active with walking and resistance training    BMET in 10 days   F/U in clinic in 4 to 6 wks     Current medicines are reviewed at length with the patient today.  The patient does not have concerns regarding medicines.  Signed, Dietrich Pates, MD  02/24/2017 9:14 PM    [facility] [address] [place], [place], [place]  [address] Phone: [phone]; Fax: [phone]

## 2017-02-25 ENCOUNTER — Ambulatory Visit: Payer: Medicaid Other | Admitting: Internal Medicine

## 2017-02-28 ENCOUNTER — Ambulatory Visit (INDEPENDENT_AMBULATORY_CARE_PROVIDER_SITE_OTHER): Payer: Medicaid Other | Admitting: Neurology

## 2017-02-28 DIAGNOSIS — R55 Syncope and collapse: Secondary | ICD-10-CM

## 2017-02-28 NOTE — Progress Notes (Signed)
   [facility]  EEG (ELECTROENCEPHALOGRAM) REPORT   STUDY DATE: 02/28/2017 PATIENT NAME: [name] [name] DOB: 1986-05-23 MRN: [id number]  ORDERING CLINICIAN: Oluw[place]iwa Kinyon A. Epimenio Foot, MD. PhD  TECHNOLOGIST: Gearldine Shown TECHNIQUE: Electroencephalogram was recorded utilizing standard 10-20 system of lead placement and reformatted into average and bipolar montages.  RECORDING TIME: 21 minutes  CLINICAL INFORMATION: 31 year old woman with syncope  FINDINGS: A digital EEG was performed while the patient was awake and drowsy. While awake and most alert there was a 10.5 hz posterior dominant rhythm that reacted to eye opening and closing. Voltages and frequencies were symmetric.  There were no focal, lateralizing, epileptiform activity or seizures seen.  Photic stimulation had a normal driving response. Hyperventilation and recovery did not change the underlying rhythms.  The patient remained awake throughout the recording.  IMPRESSION: This is a normal EEG was the patient was awake.   INTERPRETING PHYSICIAN:   Berna Gitto A. Epimenio Foot, MD, PhD, [facility] Certified in Neurology, Clinical Neurophysiology, Sleep Medicine, Pain Medicine and Neuroimaging  [facility] [address] [place], [place] [address] [phone]

## 2017-03-01 ENCOUNTER — Encounter (INDEPENDENT_AMBULATORY_CARE_PROVIDER_SITE_OTHER): Payer: Self-pay

## 2017-03-01 ENCOUNTER — Encounter: Payer: Self-pay | Admitting: Internal Medicine

## 2017-03-01 ENCOUNTER — Ambulatory Visit (INDEPENDENT_AMBULATORY_CARE_PROVIDER_SITE_OTHER): Payer: Medicaid Other | Admitting: Internal Medicine

## 2017-03-01 VITALS — BP 124/86 | HR 96 | Ht 61.0 in | Wt 210.4 lb

## 2017-03-01 DIAGNOSIS — G901 Familial dysautonomia [Riley-Day]: Secondary | ICD-10-CM

## 2017-03-01 DIAGNOSIS — G909 Disorder of the autonomic nervous system, unspecified: Secondary | ICD-10-CM

## 2017-03-01 MED ORDER — MIDODRINE HCL 2.5 MG PO TABS: 2.5000 mg | ORAL_TABLET | Freq: Two times a day (BID) | ORAL | 6 refills | Status: DC
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
## 2017-03-01 NOTE — Patient Instructions (Signed)
Your physician has recommended you make the following change in your medication:  1.) start midodrine 2.5 mg--take 1 tablet twice a day at 8am and 2pm We have removed pindolol from your medication list  Your physician recommends that you return for lab work in: today (BMET)  Please call in about 1 month with how you are feeling.

## 2017-03-02 ENCOUNTER — Ambulatory Visit
Admission: RE | Admit: 2017-03-02 | Discharge: 2017-03-02 | Disposition: A | Discharge status: Home / Self Care | Payer: Medicaid Other | Source: Ambulatory Visit | Attending: Neurology | Admitting: Neurology

## 2017-03-02 ENCOUNTER — Other Ambulatory Visit: Payer: Self-pay

## 2017-03-02 DIAGNOSIS — R200 Anesthesia of skin: Secondary | ICD-10-CM

## 2017-03-02 DIAGNOSIS — G901 Familial dysautonomia [Riley-Day]: Secondary | ICD-10-CM

## 2017-03-02 DIAGNOSIS — R55 Syncope and collapse: Secondary | ICD-10-CM | POA: Diagnosis not present

## 2017-03-02 LAB — BASIC METABOLIC PANEL
BUN/Creatinine Ratio: 17 (ref 9–23)
BUN: 11 mg/dL (ref 6–20)
CALCIUM: 9.4 mg/dL (ref 8.7–10.2)
CO2: 22 mmol/L (ref 20–29)
Chloride: 100 mmol/L (ref 96–106)
Creatinine, Ser: 0.66 mg/dL (ref 0.57–1.00)
GFR calc non Af Amer: 117 mL/min/{1.73_m2} (ref >59)
GFR, EST AFRICAN AMERICAN: 135 mL/min/{1.73_m2} (ref >59)
GLUCOSE: 74 mg/dL (ref 65–99)
POTASSIUM: 4.4 mmol/L (ref 3.5–5.2)
Sodium: 139 mmol/L (ref 134–144)

## 2017-03-02 MED ORDER — GADOBENATE DIMEGLUMINE 529 MG/ML IV SOLN
20.0000 mL | Freq: Once | INTRAVENOUS | Status: AC | PRN
  Administered 2017-03-02: 19 mL via INTRAVENOUS

## 2017-03-04 ENCOUNTER — Telehealth: Payer: Self-pay | Admitting: *Deleted

## 2017-03-04 NOTE — Telephone Encounter (Signed)
Spoke with [name] [name] this morning and per RAS, advised that MRI's of brain and spine, as well as EEG, were normal. She verbalized understanding of same/fim

## 2017-03-04 NOTE — Telephone Encounter (Signed)
-----   Message from Asa Lente, MD sent at 03/04/2017  9:50 AM EDT ----- Please let her know the MRI's of the brain and spine were normal and EEG was normal.
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
## 2017-04-29 ENCOUNTER — Encounter: Payer: Self-pay | Admitting: Physician Assistant

## 2017-04-29 ENCOUNTER — Ambulatory Visit: Payer: Medicaid Other | Admitting: Physician Assistant

## 2017-04-29 ENCOUNTER — Encounter (INDEPENDENT_AMBULATORY_CARE_PROVIDER_SITE_OTHER): Payer: Self-pay

## 2017-04-29 VITALS — BP 126/72 | HR 111 | Ht 61.0 in | Wt 207.0 lb

## 2017-04-29 DIAGNOSIS — R42 Dizziness and giddiness: Secondary | ICD-10-CM | POA: Diagnosis not present

## 2017-04-29 DIAGNOSIS — G901 Familial dysautonomia [Riley-Day]: Secondary | ICD-10-CM

## 2017-04-29 NOTE — Patient Instructions (Addendum)
Medication Instructions:  Continue current medications  If you need a refill on your cardiac medications before your next appointment, please call your pharmacy.  Labwork: None Ordered  Testing/Procedures: None Ordered   Follow-Up: Your physician wants you to follow-up in: January with Dr Tenny Crawoss.   Thank you for choosing [facility]!!

## 2017-07-11 ENCOUNTER — Encounter: Payer: Self-pay | Admitting: Internal Medicine

## 2017-07-11 ENCOUNTER — Ambulatory Visit: Payer: Medicaid Other | Admitting: Internal Medicine

## 2017-07-11 VITALS — BP 142/91 | HR 105 | Ht 61.0 in | Wt 216.4 lb

## 2017-07-11 DIAGNOSIS — G901 Familial dysautonomia [Riley-Day]: Secondary | ICD-10-CM

## 2017-07-11 DIAGNOSIS — R002 Palpitations: Secondary | ICD-10-CM | POA: Diagnosis not present

## 2017-07-11 MED ORDER — CLONAZEPAM 0.5 MG PO TABS: 0.2500 mg | ORAL_TABLET | Freq: Two times a day (BID) | ORAL | 3 refills | Status: DC

## 2017-07-11 MED ORDER — PINDOLOL 5 MG PO TABS: 2.5000 mg | ORAL_TABLET | Freq: Two times a day (BID) | ORAL | 6 refills | Status: DC

## 2017-07-11 NOTE — Patient Instructions (Signed)
Your physician has recommended you make the following change in your medication:  1.) pindolol 5 mg--take 1/2 tablet (2.5 mg) once daily, if tolerated try to increase to twice daily 2.) clonazepam 0.5 mg-take 1/2 tablet (0.25 mg) twice daily for heart rate stablization Your physician recommends that you schedule a follow-up appointment in: 3 months with Dr. Tenny Crawoss.

## 2017-07-11 NOTE — Progress Notes (Signed)
Cardiology Office Note   Date:  07/11/2017   ID:  [name] [name], DOB [birth year], MRN [id number]  PCP:  Rebecka Apley, NP  Cardiologist:   Dietrich Pates, MD   F/U of autonomic dysfunction   History of Present Illness: [name] [name] is a 32 y.o. female with a history of autonomic dysfunciton  I saw her on Jul 16 2016  [name] [name] had seen prior  Echo normal  Stress test walked only 2.4 min  Peak HR 184   When I saw her I recomm fluid and salt   Added pindolol to regime 2.5 bid   Since seen the pt says she cannot take pindolol  Made her dizzy  Stopped   I saw the pt in September   Recomm adding midodrine to regimne  She saw B Bhagat in NOvember  The pt says that she feels some better on midodrine  Still has good and bad days   No syncope    No outpatient medications have been marked as taking for the 07/11/17 encounter (Office Visit) with Pricilla Riffle, MD.     Allergies:   Vancomycin; Isoflavones; Shellfish allergy; Other; and Soy allergy   Past Medical History:  Diagnosis Date  . Anxiety   . Depression   . DUB (dysfunctional uterine bleeding)   . Dysautonomia (HCC)   . History of echocardiogram    Echo 2/18: EF 60-65, no RWMA  . History of exercise stress test    a. ETT 1/18: Ex 5', no ischemic changes, no arrhythmias.  . Palpitations    Event Monitor 12/17: NSR, no dysrhythmias  . Panic attacks   . POTS (postural orthostatic tachycardia syndrome)   . Schizoaffective disorder, bipolar type (HCC)    With PTSD  . SVT (supraventricular tachycardia) (HCC) 2004   S/P slow path modification    Past Surgical History:  Procedure Laterality Date  . CESAREAN SECTION    . TUBAL LIGATION    . VENTRICULAR ABLATION SURGERY     For supraventricular tachycardia     Social History:  The patient  reports that  has never smoked. she has never used smokeless tobacco. She reports that she does not drink alcohol or use drugs.   Family History:  The patient's family history  includes Depression in her other; Diabetes in her mother and other; Heart disease in her mother; Heart failure in her father and mother; Hypertension in her mother and other.    ROS:  Please see the history of present illness. All other systems are reviewed and  Negative to the above problem except as noted.    PHYSICAL EXAM: VS:  BP (!) 142/91   Pulse (!) 105   Ht 5\' 1"  (1.549 m)   Wt 216 lb 6.4 oz (98.2 kg)   BMI 40.89 kg/m    LAying:  123/78  P100  Sitting 125/87 P 104  Standing 120/86 P 117    GEN: Obese 32 yo, in no acute distress  HEENT: normal  Neck: JVP normal  No, carotid bruits, or masses Cardiac: RRR; no murmurs, rubs, or gallops,no edema  Respiratory:  clear to auscultation bilaterally, normal work of breathing GI: soft, nontender, nondistended, + BS  No hepatomegaly  MS: no deformity Moving all extremities   Skin: warm and dry, no rash Neuro:  Strength and sensation are intact Psych: euthymic mood, full affect   EKG:  EKG is not ordered today.   Lipid Panel    Component Value Date/Time  CHOL 151 08/08/2012 0934   TRIG 101.0 08/08/2012 0934   HDL 45.70 08/08/2012 0934   CHOLHDL 3 08/08/2012 0934   VLDL 20.2 08/08/2012 0934   LDLCALC 85 08/08/2012 0934      Wt Readings from Last 3 Encounters:  07/11/17 216 lb 6.4 oz (98.2 kg)  04/29/17 207 lb (93.9 kg)  03/01/17 210 lb 6.4 oz (95.4 kg)      ASSESSMENT AND PLAN:  1  Autonomic dysfunction  Pt still has increaein in HR   Notes some benefit with midodrine Would continue  Add pindolol 2.5 daily then bid   BP is better on midodrine than in th spring  She did say that klonopin helped in past  Will add 0.25 bid     Consider OCP to regulate cycles  F/U in April / May  Current medicines are reviewed at length with the patient today.  The patient does not have concerns regarding medicines.  Signed, Dietrich PatesPaula Shylynn Bruning, MD  07/11/2017 11:26 AM    [facility] [address],  [place], [place]  [address] Phone: [phone]; Fax: [phone]

## 2017-09-02 ENCOUNTER — Telehealth: Payer: Self-pay | Admitting: Internal Medicine

## 2017-09-02 NOTE — Telephone Encounter (Signed)
Will route to Dr. Tenny Crawoss and to Royston SinnerMegan, Pharm D for recommendations.

## 2017-09-02 NOTE — Telephone Encounter (Signed)
Risperidone may potentiate her hypotension. Due to the alpha-1 adrenergic blocking activity of second generation (atypical) antipsychotic agents, patients may experience excessive hypotensive effects when taking  concomitantly with blood pressure lowering agents (pindolol). The incidence of hypotension, orthostatic hypotension, or syncope with the use of second generation antipsychotic agents ranges 0.4% to 7%. If started would recommend monitor pressures and symptoms closely and will need to do the same with each dose titration. Otherwise no interaction with her current cardiac medications.

## 2017-09-02 NOTE — Telephone Encounter (Signed)
[name] is calling because her Psychiatrist is wanting her to start Risperidone and wants to know if its ok with her Cardiologist . Please call

## 2017-09-03 NOTE — Telephone Encounter (Signed)
I saw the pt in Jan  Added pindolol for palpitations She needs f/u for BP/ HR and symptoms    Can she come in on 1/2 day or on day I am in clinic

## 2017-09-05 NOTE — Telephone Encounter (Signed)
Patient called back. Scheduled her with Dr. Tenny Crawoss on 09/09/17. Her psychiatrist talked about her QT interval.  Requested EKG.  Had EKG at last visit 07/11/17.

## 2017-09-05 NOTE — Telephone Encounter (Signed)
Left message for patient to call back  

## 2017-09-06 ENCOUNTER — Encounter: Payer: Self-pay | Admitting: *Deleted

## 2017-09-09 ENCOUNTER — Ambulatory Visit: Payer: Medicaid Other | Admitting: Internal Medicine

## 2017-09-09 ENCOUNTER — Encounter: Payer: Self-pay | Admitting: Internal Medicine

## 2017-09-09 VITALS — BP 126/76 | HR 89 | Ht 61.0 in | Wt 217.0 lb

## 2017-09-09 DIAGNOSIS — R42 Dizziness and giddiness: Secondary | ICD-10-CM

## 2017-09-09 DIAGNOSIS — R002 Palpitations: Secondary | ICD-10-CM

## 2017-09-09 DIAGNOSIS — G901 Familial dysautonomia [Riley-Day]: Secondary | ICD-10-CM

## 2017-09-09 MED ORDER — PINDOLOL 5 MG PO TABS: 2.5000 mg | ORAL_TABLET | Freq: Two times a day (BID) | ORAL | 11 refills | Status: DC

## 2017-09-09 MED ORDER — MIDODRINE HCL 2.5 MG PO TABS: 2.5000 mg | ORAL_TABLET | Freq: Three times a day (TID) | ORAL | 11 refills | Status: DC

## 2017-09-09 NOTE — Progress Notes (Signed)
Cardiology Office Note   Date:  09/09/2017   ID:  [name] [name], DOB 1985-11-30, MRN [id number]  PCP:  Rebecka Apley, NP  Cardiologist:   Dietrich Pates, MD   F/U of autonomic dysfunction   History of Present Illness: [name] [name] is a 32 y.o. female with a history of autonomic dysfunciton I saw her in Jan   At the time she was on midodrine   I recomm a retrial of pindolol   She has not taken. She says she still feeels dizzy   No syncope   Stays active with kids but feels bad Notes tingling of scalp with midodrine Depressed   Being evlauated for new agents  Seen at [facility]   Not satisfied  Wt up   Cant' seem to lose  Current Meds  Medication Sig  . clonazePAM (KLONOPIN) 0.5 MG tablet Take 0.5 tablets (0.25 mg total) by mouth 2 (two) times daily.  . diphenhydrAMINE (BENADRYL) 25 mg capsule Take 25 mg by mouth every 6 (six) hours as needed for itching.  Marland Kitchen EPINEPHrine 0.3 mg/0.3 mL IJ SOAJ injection Inject 0.3 mg into the muscle as needed.   Marland Kitchen escitalopram (LEXAPRO) 20 MG tablet Take 20 mg by mouth daily.  . Magnesium 500 MG CAPS Take 500 mg daily by mouth.  . midodrine (PROAMATINE) 2.5 MG tablet Take 1 tablet (2.5 mg total) by mouth 2 (two) times daily with a meal. 8am and 2pm     Allergies:   Vancomycin; Isoflavones; Shellfish allergy; Other; and Soy allergy   Past Medical History:  Diagnosis Date  . Anxiety   . Depression   . DUB (dysfunctional uterine bleeding)   . Dysautonomia (HCC)   . History of echocardiogram    Echo 2/18: EF 60-65, no RWMA  . History of exercise stress test    a. ETT 1/18: Ex 5', no ischemic changes, no arrhythmias.  . Palpitations    Event Monitor 12/17: NSR, no dysrhythmias  . Panic attacks   . POTS (postural orthostatic tachycardia syndrome)   . Schizoaffective disorder, bipolar type (HCC)    With PTSD  . SVT (supraventricular tachycardia) (HCC) 2004   S/P slow path modification    Past Surgical History:    Procedure Laterality Date  . CESAREAN SECTION    . TUBAL LIGATION    . VENTRICULAR ABLATION SURGERY     For supraventricular tachycardia     Social History:  The patient  reports that she has never smoked. She has never used smokeless tobacco. She reports that she does not drink alcohol or use drugs.   Family History:  The patient's family history includes Depression in her other; Diabetes in her mother and other; Heart disease in her mother; Heart failure in her father and mother; Hypertension in her mother and other.    ROS:  Please see the history of present illness. All other systems are reviewed and  Negative to the above problem except as noted.    PHYSICAL EXAM: VS:  BP 126/76   Pulse 89   Ht 5\' 1"  (1.549 m)   Wt 217 lb (98.4 kg)   SpO2 98%   BMI 41.00 kg/m    LAying:  123/78  P100  Sitting 125/87 P 104  Standing 120/86 P 117    GEN  Morbidly obese 32 yo, in no acute distress  HEENT: normal  Neck: JVP normal  No, carotid bruits, or masses Cardiac: RRR; no murmurs, rubs, or gallops,no edema  Respiratory:  clear to auscultation bilaterally, normal work of breathing GI: soft, nontender, nondistended, + BS  No hepatomegaly  MS: no deformity Moving all extremities   Skin: warm and dry, no rash Neuro:  Strength and sensation are intact Psych: euthymic mood, full affect   EKG:  EKG is  ordered today.  SR 92 bpm  QTc 430 msec   Lipid Panel    Component Value Date/Time   CHOL 151 08/08/2012 0934   TRIG 101.0 08/08/2012 0934   HDL 45.70 08/08/2012 0934   CHOLHDL 3 08/08/2012 0934   VLDL 20.2 08/08/2012 0934   LDLCALC 85 08/08/2012 0934      Wt Readings from Last 3 Encounters:  09/09/17 217 lb (98.4 kg)  07/11/17 216 lb 6.4 oz (98.2 kg)  04/29/17 207 lb (93.9 kg)      ASSESSMENT AND PLAN:  1  Autonomic dysfunction I would increase midodrin to tid and would recomm a retrial of pindolol 2.5 daily    Increase fluids and salt    2  Depression   Will contack  [facility]  Need to avoid meds that will cause hypotension    F/U this summer  Current medicines are reviewed at length with the patient today.  The patient does not have concerns regarding medicines.  Signed, Dietrich PatesPaula Merrisa Skorupski, MD  09/09/2017 10:58 AM    [facility] [address], [place], [place]  [address] Phone: [phone]; Fax: [phone]

## 2017-09-12 ENCOUNTER — Telehealth: Payer: Self-pay | Admitting: Internal Medicine

## 2017-09-12 NOTE — Telephone Encounter (Signed)
New Message   Pt c/o medication issue:  1. Name of Medication: pindolol (VISKEN) 5 MG tablet  And midodrine (PROAMATINE) 2.5 MG tablet    2. How are you currently taking this medication (dosage and times per day)?   3. Are you having a reaction (difficulty breathing--STAT)?   4. What is your medication issue? Patient states that she no longer has Medicaid. She wants to know can a generic be prescribed that is more cost effective. As well as any other assistance that she may be able to obtain for the cost of prescriptions.

## 2017-09-12 NOTE — Telephone Encounter (Signed)
New Message    Patient calling the office for samples of medication:   1.  What medication and dosage are you requesting samples for? pindolol (VISKEN) 5 MG tablet  midodrine (PROAMATINE) 2.5 MG tablet   2.  Are you currently out of this medication? Yes   Patient no longer has insurance and wants to know if amy samples are available.

## 2017-09-12 NOTE — Telephone Encounter (Signed)
Called pt to inform her that we did not have samples of pindolol or midodrine and that pt's first message was sent to Dr. Charlott Rakesoss's nurse Marlan PalauMichalene,RN and that the nurse will address this matter as soon as possible. Pt stated that she did not ask for samples, that she asked for generics for these medications. I apologized to the pt and informed her that her message was sent to the nurse already. Pt verbalized understanding.

## 2017-09-13 NOTE — Telephone Encounter (Signed)
Lengthy conversation with patient regarding medications/medical insurance. She no longer has medicaid because of household income.  Her husband has health insurance and option to cover her but the cost is too high to afford.   She feels bad daily (dizziness, could pass out, heart pounding) but has not picked up midodrine or pindolol due to cost.  After further discussion patient tells me she has both of these meds already at home (ordered previously and did not take)  She is reluctant to take because what will she do when they run out.  I researched but did not see Patient Assistance Programs for either drug.  I have suggested she make appointment with social services to determine her options.  In the meantime I advised that she start taking midodrine and pindolol as prescribed by Dr. Tenny Crawoss to see if her symptoms improve.   I asked her to call me back in couple weeks with updates.

## 2017-10-03 ENCOUNTER — Other Ambulatory Visit: Payer: Self-pay

## 2017-10-03 ENCOUNTER — Ambulatory Visit (INDEPENDENT_AMBULATORY_CARE_PROVIDER_SITE_OTHER): Payer: Self-pay | Admitting: Advanced Practice Midwife

## 2017-10-03 ENCOUNTER — Encounter: Payer: Self-pay | Admitting: Advanced Practice Midwife

## 2017-10-03 VITALS — BP 118/68 | HR 102 | Ht 61.0 in | Wt 220.0 lb

## 2017-10-03 DIAGNOSIS — N760 Acute vaginitis: Secondary | ICD-10-CM

## 2017-10-03 MED ORDER — METRONIDAZOLE 0.75 % VA GEL: 1.0000 | Freq: Every day | VAGINAL | 1 refills | Status: DC

## 2017-10-03 MED ORDER — FLUCONAZOLE 150 MG PO TABS: ORAL_TABLET | ORAL | 2 refills | Status: DC

## 2017-10-03 MED ORDER — METRONIDAZOLE 500 MG PO TABS: 500.0000 mg | ORAL_TABLET | Freq: Two times a day (BID) | ORAL | 0 refills | Status: DC

## 2017-10-03 NOTE — Progress Notes (Addendum)
CHIEF COMPLAINT/HPI:  32 y.o. female complains of white vaginal discharge for 5 days . Denies abnormal vaginal bleeding, significant pelvic pain or fever.  + itch, + irritation, - odor. No UTI symptoms.   Denies history of known exposure to STD or symptoms in partner.  Patient's last menstrual period was 09/13/2017.  Has not tried an OTC product.   Review of Systems  Neg except for HPI   Past Medical History: Past Medical History:  Diagnosis Date  . Anxiety   . Depression   . DUB (dysfunctional uterine bleeding)   . Dysautonomia (HCC)   . History of echocardiogram    Echo 2/18: EF 60-65, no RWMA  . History of exercise stress test    a. ETT 1/18: Ex 5', no ischemic changes, no arrhythmias.  . Palpitations    Event Monitor 12/17: NSR, no dysrhythmias  . Panic attacks   . POTS (postural orthostatic tachycardia syndrome)   . Schizoaffective disorder, bipolar type (HCC)    With PTSD  . SVT (supraventricular tachycardia) (HCC) 2004   S/P slow path modification    Past Surgical History: Past Surgical History:  Procedure Laterality Date  . CESAREAN SECTION    . TUBAL LIGATION    . VENTRICULAR ABLATION SURGERY     For supraventricular tachycardia    Obstetrical History: OB History    Gravida  3   Para  3   Term  3   Preterm      AB      Living        SAB      TAB      Ectopic      Multiple      Live Births              Social History: Social History   Socioeconomic History  . Marital status: Married    Spouse name: Not on file  . Number of children: Not on file  . Years of education: Not on file  . Highest education level: Not on file  Occupational History  . Not on file  Social Needs  . Financial resource strain: Not on file  . Food insecurity:    Worry: Not on file    Inability: Not on file  . Transportation needs:    Medical: Not on file    Non-medical: Not on file  Tobacco Use  . Smoking status: Never Smoker  . Smokeless tobacco:  Never Used  Substance and Sexual Activity  . Alcohol use: No    Comment: Denies  . Drug use: No    Comment: Denies  . Sexual activity: Yes    Partners: Male    Birth control/protection: Surgical  Lifestyle  . Physical activity:    Days per week: Not on file    Minutes per session: Not on file  . Stress: Not on file  Relationships  . Social connections:    Talks on phone: Not on file    Gets together: Not on file    Attends religious service: Not on file    Active member of club or organization: Not on file    Attends meetings of clubs or organizations: Not on file    Relationship status: Not on file  Other Topics Concern  . Not on file  Social History Narrative  . Not on file    Family History: Family History  Problem Relation Age of Onset  . Heart failure Mother   . Heart disease Mother   .  Hypertension Mother   . Diabetes Mother   . Heart failure Father   . Hypertension Other   . Diabetes Other   . Depression Other     Allergies: Allergies  Allergen Reactions  . 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 Isoflavones Hives  . Shellfish Allergy Hives  . Other Hives  . Soy Allergy Hives        PHYSICAL EXAM: Physical Examination: General appearance - well appearing, and in no distress Mental status - alert, oriented to person, place, and time Chest:  Normal respiratory effort Heart - normal rate and regular rhythm Abdomen:  Soft, nontender Pelvic: SSE:  white discharge, wet prep - trich, + clue, - wbc, + yeast    Labs: No results found for this or any previous visit (from the past 24 hour(s)).   Assessment: Patient Active Problem List   Diagnosis Date Noted  . Syncope 02/06/2017  . Numbness 02/06/2017  . Insomnia 02/06/2017  . Schizoaffective disorder, bipolar type (HCC)   . Obesity (BMI  35.0-39.9 without comorbidity) 02/24/2014  . Dysautonomia (HCC) 12/21/2010  . 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 UNSPECIFIED HYPOTENSION 02/23/2009  . Tachycardia 02/23/2009    Plan:  No orders of the defined types were placed in this encounter.  Vaginitis rx metrogel vs flagyl (seflf pay, will see which is cheaper) and diflucan  [name] [name] 10/03/17 11:52 AM

## 2017-10-03 NOTE — Addendum Note (Signed)
Addended by: Jacklyn ShellRESENZO-DISHMON, Yanis Juma on: 10/03/2017 12:31 PM   Modules accepted: Orders

## 2017-10-11 ENCOUNTER — Ambulatory Visit: Payer: Medicaid Other | Admitting: Internal Medicine

## 2017-10-16 ENCOUNTER — Encounter: Payer: Self-pay | Admitting: Internal Medicine

## 2017-10-25 ENCOUNTER — Encounter (HOSPITAL_COMMUNITY): Payer: Self-pay

## 2017-10-25 ENCOUNTER — Emergency Department (HOSPITAL_COMMUNITY)
Admission: EM | Admit: 2017-10-25 | Discharge: 2017-10-26 | Disposition: A | Discharge status: Home / Self Care | Payer: Self-pay | Attending: Emergency Medicine | Admitting: Emergency Medicine

## 2017-10-25 ENCOUNTER — Other Ambulatory Visit: Payer: Self-pay

## 2017-10-25 DIAGNOSIS — K529 Noninfective gastroenteritis and colitis, unspecified: Secondary | ICD-10-CM | POA: Insufficient documentation

## 2017-10-25 DIAGNOSIS — Z79899 Other long term (current) drug therapy: Secondary | ICD-10-CM | POA: Insufficient documentation

## 2017-10-25 NOTE — ED Triage Notes (Signed)
Pt reports fever, diarrhea, and upper abdominal pain since Tuesday, worse on left.  Pain has gotten worse, comes in waves- sharp when it hits, worse after eating.

## 2017-10-26 ENCOUNTER — Emergency Department (HOSPITAL_COMMUNITY): Payer: Self-pay

## 2017-10-26 LAB — DIFFERENTIAL
Basophils Absolute: 0 10*3/uL (ref 0.0–0.1)
Basophils Relative: 0 %
EOS ABS: 0 10*3/uL (ref 0.0–0.7)
EOS PCT: 0 %
LYMPHS PCT: 18 %
Lymphs Abs: 2.1 10*3/uL (ref 0.7–4.0)
MONO ABS: 0.7 10*3/uL (ref 0.1–1.0)
Monocytes Relative: 6 %
NEUTROS ABS: 8.6 10*3/uL — AB (ref 1.7–7.7)
NEUTROS PCT: 76 %

## 2017-10-26 LAB — URINALYSIS, ROUTINE W REFLEX MICROSCOPIC
BILIRUBIN URINE: NEGATIVE
Bacteria, UA: NONE SEEN
Glucose, UA: NEGATIVE mg/dL
Ketones, ur: NEGATIVE mg/dL
Leukocytes, UA: NEGATIVE
NITRITE: NEGATIVE
PH: 7 (ref 5.0–8.0)
Protein, ur: NEGATIVE mg/dL
RBC / HPF: >50 RBC/hpf — ABNORMAL HIGH (ref 0–5)
SPECIFIC GRAVITY, URINE: 1.009 (ref 1.005–1.030)

## 2017-10-26 LAB — COMPREHENSIVE METABOLIC PANEL
ALK PHOS: 74 U/L (ref 38–126)
ALT: 23 U/L (ref 14–54)
AST: 20 U/L (ref 15–41)
Albumin: 3.9 g/dL (ref 3.5–5.0)
Anion gap: 9 (ref 5–15)
BUN: 9 mg/dL (ref 6–20)
CALCIUM: 8.9 mg/dL (ref 8.9–10.3)
CO2: 25 mmol/L (ref 22–32)
CREATININE: 0.85 mg/dL (ref 0.44–1.00)
Chloride: 104 mmol/L (ref 101–111)
Glucose, Bld: 113 mg/dL — ABNORMAL HIGH (ref 65–99)
Potassium: 3.6 mmol/L (ref 3.5–5.1)
Sodium: 138 mmol/L (ref 135–145)
Total Bilirubin: 0.8 mg/dL (ref 0.3–1.2)
Total Protein: 7.6 g/dL (ref 6.5–8.1)

## 2017-10-26 LAB — CBC
HCT: 41 % (ref 36.0–46.0)
HEMOGLOBIN: 13 g/dL (ref 12.0–15.0)
MCH: 28 pg (ref 26.0–34.0)
MCHC: 31.7 g/dL (ref 30.0–36.0)
MCV: 88.2 fL (ref 78.0–100.0)
Platelets: 338 10*3/uL (ref 150–400)
RBC: 4.65 MIL/uL (ref 3.87–5.11)
RDW: 14.4 % (ref 11.5–15.5)
WBC: 11.6 10*3/uL — ABNORMAL HIGH (ref 4.0–10.5)

## 2017-10-26 LAB — I-STAT CG4 LACTIC ACID, ED: Lactic Acid, Venous: 1.2 mmol/L (ref 0.5–1.9)

## 2017-10-26 LAB — PREGNANCY, URINE: Preg Test, Ur: NEGATIVE

## 2017-10-26 LAB — LIPASE, BLOOD: Lipase: 31 U/L (ref 11–51)

## 2017-10-26 MED ORDER — SODIUM CHLORIDE 0.9 % IV BOLUS
1000.0000 mL | Freq: Once | INTRAVENOUS | Status: AC
  Administered 2017-10-26: 1000 mL via INTRAVENOUS

## 2017-10-26 MED ORDER — ONDANSETRON HCL 4 MG/2ML IJ SOLN
4.0000 mg | Freq: Once | INTRAMUSCULAR | Status: AC
  Administered 2017-10-26: 4 mg via INTRAVENOUS
  Filled 2017-10-26: qty 2

## 2017-10-26 MED ORDER — METRONIDAZOLE 500 MG PO TABS
500.0000 mg | ORAL_TABLET | Freq: Once | ORAL | Status: AC
  Administered 2017-10-26: 500 mg via ORAL
  Filled 2017-10-26: qty 1

## 2017-10-26 MED ORDER — MORPHINE SULFATE (PF) 4 MG/ML IV SOLN
4.0000 mg | Freq: Once | INTRAVENOUS | Status: AC
  Administered 2017-10-26: 4 mg via INTRAVENOUS
  Filled 2017-10-26: qty 1

## 2017-10-26 MED ORDER — HYDROCODONE-ACETAMINOPHEN 5-325 MG PO TABS: 1.0000 | ORAL_TABLET | ORAL | 0 refills | Status: DC | PRN

## 2017-10-26 MED ORDER — CIPROFLOXACIN HCL 500 MG PO TABS: 500.0000 mg | ORAL_TABLET | Freq: Two times a day (BID) | ORAL | 0 refills | Status: DC

## 2017-10-26 MED ORDER — IOPAMIDOL (ISOVUE-300) INJECTION 61%
100.0000 mL | Freq: Once | INTRAVENOUS | Status: AC | PRN
  Administered 2017-10-26: 100 mL via INTRAVENOUS

## 2017-10-26 MED ORDER — METRONIDAZOLE 500 MG PO TABS: 500.0000 mg | ORAL_TABLET | Freq: Three times a day (TID) | ORAL | 0 refills | Status: DC

## 2017-10-26 MED ORDER — CIPROFLOXACIN IN D5W 400 MG/200ML IV SOLN
400.0000 mg | Freq: Once | INTRAVENOUS | Status: AC
  Administered 2017-10-26: 400 mg via INTRAVENOUS
  Filled 2017-10-26: qty 200

## 2017-10-26 MED ORDER — ONDANSETRON HCL 4 MG PO TABS: 4.0000 mg | ORAL_TABLET | Freq: Four times a day (QID) | ORAL | 0 refills | Status: DC

## 2017-10-26 NOTE — ED Notes (Signed)
Patient transported to CT 

## 2017-10-26 NOTE — ED Provider Notes (Signed)
[facility] EMERGENCY DEPARTMENT Provider Note   CSN: [id number] Arrival date & time: 10/25/17  2307     History   Chief Complaint Chief Complaint  Patient presents with  . Abdominal Pain    fever     HPI [name] [name] is a 32 y.o. female.  Patient presents to the emergency department for evaluation of fever, abdominal pain with diarrhea.  Symptoms present for 3 days.  Patient reports constant pain but she has intermittent episodes of sharp stabbing worsening pains.  She reports that she has had nausea but no vomiting.  Diarrhea has been profuse and watery, anything she eats or drinks "goes right through her".  Patient has not had any urinary symptoms.  Pain is across the upper abdomen, radiates more to the left side.     Past Medical History:  Diagnosis Date  . Anxiety   . Depression   . DUB (dysfunctional uterine bleeding)   . Dysautonomia (HCC)   . History of echocardiogram    Echo 2/18: EF 60-65, no RWMA  . History of exercise stress test    a. ETT 1/18: Ex 5', no ischemic changes, no arrhythmias.  . Palpitations    Event Monitor 12/17: NSR, no dysrhythmias  . Panic attacks   . POTS (postural orthostatic tachycardia syndrome)   . Schizoaffective disorder, bipolar type (HCC)    With PTSD  . SVT (supraventricular tachycardia) (HCC) 2004   S/P slow path modification    Patient Active Problem List   Diagnosis Date Noted  . Syncope 02/06/2017  . Numbness 02/06/2017  . Insomnia 02/06/2017  . Schizoaffective disorder, bipolar type (HCC)   . Obesity (BMI 35.0-39.9 without comorbidity) 02/24/2014  . Dysautonomia (HCC) 12/21/2010  . 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 UNSPECIFIED HYPOTENSION 02/23/2009  . Tachycardia 02/23/2009    Past Surgical History:  Procedure Laterality Date  . CESAREAN SECTION    . TUBAL LIGATION    . VENTRICULAR ABLATION SURGERY     For supraventricular tachycardia     OB History    Gravida  3   Para    3   Term  3   Preterm      AB      Living        SAB      TAB      Ectopic      Multiple      Live Births               Home Medications    Prior to Admission medications   Medication Sig Start Date End Date Taking? Authorizing Provider  ciprofloxacin (CIPRO) 500 MG tablet Take 1 tablet (500 mg total) by mouth 2 (two) times daily. 10/26/17   Gilda Crease, MD  clonazePAM (KLONOPIN) 0.5 MG tablet Take 0.5 tablets (0.25 mg total) by mouth 2 (two) times daily. Patient not taking: Reported on 10/03/2017 07/11/17   Pricilla Riffle, MD  diphenhydrAMINE (BENADRYL) 25 mg capsule Take 25 mg by mouth every 6 (six) hours as needed for itching.    [provider]  EPINEPHrine 0.3 mg/0.3 mL IJ SOAJ injection Inject 0.3 mg into the muscle as needed.     [provider]  escitalopram (LEXAPRO) 20 MG tablet Take 20 mg by mouth daily.    [provider]  fluconazole (DIFLUCAN) 150 MG tablet 1 po stat; repeat in 3 days 10/03/17   Cresenzo-Dishmon, Scarlette Calico, CNM  HYDROcodone-acetaminophen (NORCO/VICODIN) 5-325 MG tablet Take  1 tablet by mouth every 4 (four) hours as needed for moderate pain. 10/26/17   Gilda Crease, MD  Magnesium 500 MG CAPS Take 500 mg daily by mouth.    [provider]  metroNIDAZOLE (FLAGYL) 500 MG tablet Take 1 tablet (500 mg total) by mouth 3 (three) times daily. 10/26/17   Gilda Crease, MD  metroNIDAZOLE (METROGEL) 0.75 % vaginal gel Place 1 Applicatorful vaginally at bedtime. Apply one applicatorful to vagina at bedtime for 5 days 10/03/17   Cresenzo-Dishmon, Scarlette Calico, CNM  midodrine (PROAMATINE) 2.5 MG tablet Take 1 tablet (2.5 mg total) by mouth 3 (three) times daily with meals. Patient not taking: Reported on 10/03/2017 09/09/17   Pricilla Riffle, MD  ondansetron (ZOFRAN) 4 MG tablet Take 1 tablet (4 mg total) by mouth every 6 (six) hours. 10/26/17   Gilda Crease, MD  pindolol (VISKEN) 5 MG tablet Take 0.5  tablets (2.5 mg total) by mouth 2 (two) times daily. Start with once daily, increase to twice daily if tolerated. Patient not taking: Reported on 10/03/2017 09/09/17   Pricilla Riffle, MD  gabapentin (NEURONTIN) 100 MG capsule Take 100 mg by mouth at bedtime.    09/03/11  [provider]    Family History Family History  Problem Relation Age of Onset  . Heart failure Mother   . Heart disease Mother   . Hypertension Mother   . Diabetes Mother   . Heart failure Father   . Hypertension Other   . Diabetes Other   . Depression Other     Social History Social History   Tobacco Use  . Smoking status: Never Smoker  . Smokeless tobacco: Never Used  Substance Use Topics  . Alcohol use: No    Comment: Denies  . Drug use: No    Comment: Denies     Allergies   Vancomycin; Isoflavones; Shellfish allergy; Other; and Soy allergy   Review of Systems Review of Systems  Constitutional: Positive for fever.  Gastrointestinal: Positive for abdominal pain, diarrhea and nausea.  All other systems reviewed and are negative.    Physical Exam Updated Vital Signs BP 92/69 (BP Location: Left Arm)   Pulse 94   Temp 98 F (36.7 C) (Oral)   Resp 16   Ht  (1.549 m)   Wt 97.5 kg (215 lb)   LMP 10/25/2017   SpO2 99%   BMI 40.62 kg/m   Physical Exam  Constitutional: She is oriented to person, place, and time. She appears well-developed and well-nourished. No distress.  HENT:  Head: Normocephalic and atraumatic.  Right Ear: Hearing normal.  Left Ear: Hearing normal.  Nose: Nose normal.  Mouth/Throat: Oropharynx is clear and moist and mucous membranes are normal.  Eyes: Pupils are equal, round, and reactive to light. Conjunctivae and EOM are normal.  Neck: Normal range of motion. Neck supple.  Cardiovascular: Regular rhythm, S1 normal and S2 normal. Exam reveals no gallop and no friction rub.  No murmur heard. Pulmonary/Chest: Effort normal and breath sounds normal. No  respiratory distress. She exhibits no tenderness.  Abdominal: Soft. Normal appearance and bowel sounds are normal. There is no hepatosplenomegaly. There is tenderness in the epigastric area. There is no rebound, no guarding, no tenderness at McBurney's point and negative Murphy's sign. No hernia.  Musculoskeletal: Normal range of motion.  Neurological: She is alert and oriented to person, place, and time. She has normal strength. No cranial nerve deficit or sensory deficit. Coordination normal. GCS  eye subscore is 4. GCS verbal subscore is 5. GCS motor subscore is 6.  Skin: Skin is warm, dry and intact. No rash noted. No cyanosis.  Psychiatric: She has a normal mood and affect. Her speech is normal and behavior is normal. Thought content normal.  Nursing note and vitals reviewed.    ED Treatments / Results  Labs (all labs ordered are listed, but only abnormal results are displayed) Labs Reviewed  COMPREHENSIVE METABOLIC PANEL - Abnormal; Notable for the following components:      Result Value   Glucose, Bld 113 (*)    All other components within normal limits  CBC - Abnormal; Notable for the following components:   WBC 11.6 (*)    All other components within normal limits  URINALYSIS, ROUTINE W REFLEX MICROSCOPIC - Abnormal; Notable for the following components:   Hgb urine dipstick LARGE (*)    RBC / HPF >50 (*)    All other components within normal limits  DIFFERENTIAL - Abnormal; Notable for the following components:   Neutro Abs 8.6 (*)    All other components within normal limits  LIPASE, BLOOD  PREGNANCY, URINE  I-STAT CG4 LACTIC ACID, ED    EKG None  Radiology Ct Abdomen Pelvis W Contrast  Result Date: 10/26/2017 CLINICAL DATA:  Right upper quadrant abdominal pain with intermittent nausea and vomiting since Tuesday. Patient also reports fever diarrhea since Tuesday. EXAM: CT ABDOMEN AND PELVIS WITH CONTRAST TECHNIQUE: Multidetector CT imaging of the abdomen and pelvis  was performed using the standard protocol following bolus administration of intravenous contrast. CONTRAST:  ISOVUE-300 IOPAMIDOL (ISOVUE-300) INJECTION 61% COMPARISON:  04/02/2015 FINDINGS: Lower chest: Normal size heart. No pleural effusion. Minimal subpleural atelectasis at the left lung base. Hepatobiliary: Homogeneous appearance of the liver without space-occupying mass. Nondistended gallbladder. No evidence of cholecystitis or cholelithiasis. No biliary dilatation. Pancreas: Normal Spleen: Normal Adrenals/Urinary Tract: Normal bilateral adrenal glands and kidneys. No obstructive uropathy. Unremarkable urinary bladder without focal mural thickening or calculus. Stomach/Bowel: Decompressed stomach. Normal small bowel rotation. Mild jejunal distention in the left upper quadrant without mechanical bowel obstruction. A nonobstructed non distended large bowel. There suggestion of mild transmural thickening of the sigmoid colon some which is likely due to underdistention. Mild colitis is not excluded. Vascular/Lymphatic: No significant vascular findings are present. No enlarged abdominal or pelvic lymph nodes. Reproductive: Bilateral tubal ligations. Physiologic dominant follicle in the right ovary measuring 2 cm. Uterus is unremarkable. Other: No free air nor free fluid. Musculoskeletal: No acute or significant osseous findings. IMPRESSION: Mild enterocolitis with more focal dilatation of proximal jejunal loops and suggestion of mild transmural thickening of the sigmoid. No mechanical bowel obstruction. Electronically Signed   By: Tollie Eth M.D.   On: 10/26/2017 01:28    Procedures Procedures (including critical care time)  Medications Ordered in ED Medications  sodium chloride 0.9 % bolus 1,000 mL (0 mLs Intravenous Stopped 10/26/17 0234)  morphine 4 MG/ML injection 4 mg (4 mg Intravenous Given 10/26/17 0014)  ondansetron (ZOFRAN) injection 4 mg (4 mg Intravenous Given 10/26/17 0014)  iopamidol  (ISOVUE-300) 61 % injection 100 mL (100 mLs Intravenous Contrast Given 10/26/17 0039)  ciprofloxacin (CIPRO) IVPB 400 mg (0 mg Intravenous Stopped 10/26/17 0357)  metroNIDAZOLE (FLAGYL) tablet 500 mg (500 mg Oral Given 10/26/17 0234)     Initial Impression / Assessment and Plan / ED Course  I have reviewed the triage vital signs and the nursing notes.  Pertinent labs & imaging results that were  available during my care of the patient were reviewed by me and considered in my medical decision making (see chart for details).     Patient presents to the emergency department for evaluation of fever, nausea, abdominal pain with diarrhea.  Symptoms present for several days.  Abdominal exam does not reveal any signs of peritonitis.  She did have a fever at arrival, normal lactic acid.  White blood cell count mildly elevated at 11.6.  No signs of urinary infection on urinalysis.  Patient underwent CT scan for further evaluation.  There is evidence of enterocolitis.  This still could possibly be viral in nature, however, due to her fever and length of symptoms, will initiate antibiotic coverage.  Follow-up with gastroenterology.  Return for worsening symptoms.  Final Clinical Impressions(s) / ED Diagnoses   Final diagnoses:  Enterocolitis    ED Discharge Orders        Ordered    ciprofloxacin (CIPRO) 500 MG tablet  2 times daily     10/26/17 0252    metroNIDAZOLE (FLAGYL) 500 MG tablet  3 times daily     10/26/17 0252    ondansetron (ZOFRAN) 4 MG tablet  Every 6 hours     10/26/17 0252    HYDROcodone-acetaminophen (NORCO/VICODIN) 5-325 MG tablet  Every 4 hours PRN     10/26/17 0252       Gilda Crease, MD 10/26/17 [phone]

## 2017-10-29 ENCOUNTER — Other Ambulatory Visit: Payer: Medicaid Other | Admitting: Advanced Practice Midwife

## 2017-11-19 ENCOUNTER — Encounter (INDEPENDENT_AMBULATORY_CARE_PROVIDER_SITE_OTHER): Payer: Self-pay | Admitting: Internal Medicine

## 2017-11-19 ENCOUNTER — Ambulatory Visit (INDEPENDENT_AMBULATORY_CARE_PROVIDER_SITE_OTHER): Payer: Medicaid Other | Admitting: Internal Medicine

## 2017-11-19 VITALS — BP 126/80 | HR 84 | Temp 98.5°F | Ht 61.0 in | Wt 215.2 lb

## 2017-11-19 DIAGNOSIS — K529 Noninfective gastroenteritis and colitis, unspecified: Secondary | ICD-10-CM

## 2017-11-19 LAB — CBC WITH DIFFERENTIAL/PLATELET
BASOS ABS: 65 {cells}/uL (ref 0–200)
Basophils Relative: 0.6 %
EOS ABS: 162 {cells}/uL (ref 15–500)
Eosinophils Relative: 1.5 %
HCT: 39.8 % (ref 35.0–45.0)
HEMOGLOBIN: 13 g/dL (ref 11.7–15.5)
Lymphs Abs: 3294 cells/uL (ref 850–3900)
MCH: 27.9 pg (ref 27.0–33.0)
MCHC: 32.7 g/dL (ref 32.0–36.0)
MCV: 85.4 fL (ref 80.0–100.0)
MONOS PCT: 6.2 %
MPV: 10.8 fL (ref 7.5–12.5)
NEUTROS PCT: 61.2 %
Neutro Abs: 6610 cells/uL (ref 1500–7800)
PLATELETS: 353 10*3/uL (ref 140–400)
RBC: 4.66 10*6/uL (ref 3.80–5.10)
RDW: 13.8 % (ref 11.0–15.0)
TOTAL LYMPHOCYTE: 30.5 %
WBC mixed population: 670 cells/uL (ref 200–950)
WBC: 10.8 10*3/uL (ref 3.8–10.8)

## 2017-11-19 NOTE — Patient Instructions (Signed)
CBC today.  

## 2017-11-19 NOTE — Progress Notes (Signed)
Subjective:    Patient ID: [name] [name], female    DOB: [birth year], 32 y.o.   MRN: [id number]  HPI Referred by Emmit AlexandersKatherine Hess PA-C for enterocolitis. Seen in the ED at [facility] 10/26/2017. Symptoms included abdominal pain, diarrhea and fever x 3 days.  She had nausea but no vomiting.  Fever of 103. Diarrhea was very watery. No urinary symptoms.  CT abdomen pelvis revealed  IMPRESSION: Mild enterocolitis with more focal dilatation of proximal jejunal loops and suggestion of mild transmural thickening of the sigmoid. No mechanical bowel obstruction. She also states she had some blood in her stool.  Urine pregnancy was negative.  She was Cipro and Flagyl x 10 days. Today, she has some slight abdominal discomfort.  No recent fever.  She is having on average 2-3 stools a day. She does have diarrhea which is normal for her.  She says she 80 % better. Appetite is good. Weight loss of 6 pounds.  No family hx of Crohn's disease.   Urinalysis    Component Value Date/Time   COLORURINE YELLOW 10/25/2017 2340   APPEARANCEUR CLEAR 10/25/2017 2340   APPEARANCEUR Clear 07/16/2016 1251   LABSPEC 1.009 10/25/2017 2340   PHURINE 7.0 10/25/2017 2340   GLUCOSEU NEGATIVE 10/25/2017 2340   GLUCOSEU NEG mg/dL 21/30/865709/23/2011 [id number]   HGBUR LARGE (A) 10/25/2017 2340   BILIRUBINUR NEGATIVE 10/25/2017 2340   BILIRUBINUR Negative 07/16/2016 1251   KETONESUR NEGATIVE 10/25/2017 2340   PROTEINUR NEGATIVE 10/25/2017 2340   UROBILINOGEN 0.2 04/02/2015 2106   NITRITE NEGATIVE 10/25/2017 2340   LEUKOCYTESUR NEGATIVE 10/25/2017 2340   LEUKOCYTESUR Negative 07/16/2016 1251      CBC    Component Value Date/Time   WBC 11.6 (H) 10/25/2017 2348   RBC 4.65 10/25/2017 2348   HGB 13.0 10/25/2017 2348   HCT 41.0 10/25/2017 2348   PLT 338 10/25/2017 2348   MCV 88.2 10/25/2017 2348   MCH 28.0 10/25/2017 2348   MCHC 31.7 10/25/2017 2348   RDW 14.4 10/25/2017 2348   LYMPHSABS 2.1 10/25/2017 2340   MONOABS 0.7 10/25/2017  2340   EOSABS 0.0 10/25/2017 2340   BASOSABS 0.0 10/25/2017 2340   CMP Latest Ref Rng & Units 10/25/2017 03/01/2017 05/18/2016  Glucose 65 - 99 mg/dL 629(B113(H) 74 79  BUN 6 - 20 mg/dL 9 11 14   Creatinine 0.44 - 1.00 mg/dL 2.840.85 1.320.66 4.400.83  Sodium 135 - 145 mmol/L 138 139 143  Potassium 3.5 - 5.1 mmol/L 3.6 4.4 4.4  Chloride 101 - 111 mmol/L 104 100 111(H)  CO2 22 - 32 mmol/L 25 22 22   Calcium 8.9 - 10.3 mg/dL 8.9 9.4 8.7  Total Protein 6.5 - 8.1 g/dL 7.6 - -  Total Bilirubin 0.3 - 1.2 mg/dL 0.8 - -  Alkaline Phos 38 - 126 U/L 74 - -  AST 15 - 41 U/L 20 - -  ALT 14 - 54 U/L 23 - -   Lipase     Component Value Date/Time   LIPASE 31 10/25/2017 2348       Review of Systems Past Medical History:  Diagnosis Date  . Anxiety   . Depression   . DUB (dysfunctional uterine bleeding)   . Dysautonomia (HCC)   . History of echocardiogram    Echo 2/18: EF 60-65, no RWMA  . History of exercise stress test    a. ETT 1/18: Ex 5', no ischemic changes, no arrhythmias.  . Palpitations    Event Monitor 12/17: NSR, no dysrhythmias  .  Panic attacks   . POTS (postural orthostatic tachycardia syndrome)   . Schizoaffective disorder, bipolar type (HCC)    With PTSD  . SVT (supraventricular tachycardia) (HCC) 2004   S/P slow path modification    Past Surgical History:  Procedure Laterality Date  . CESAREAN SECTION    . TUBAL LIGATION    . VENTRICULAR ABLATION SURGERY     For supraventricular tachycardia    Allergies  Allergen Reactions  . 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 Isoflavones Hives  . Shellfish Allergy Hives  . Other Hives  . Soy Allergy Hives    Current Outpatient Medications on File Prior to Visit  Medication Sig Dispense Refill  . clonazePAM (KLONOPIN) 0.5 MG tablet Take 0.5 tablets (0.25 mg total) by mouth 2 (two)  times daily. 30 tablet 3  . diphenhydrAMINE (BENADRYL) 25 mg capsule Take 25 mg by mouth every 6 (six) hours as needed for itching.    Marland Kitchen EPINEPHrine 0.3 mg/0.3 mL IJ SOAJ injection Inject 0.3 mg into the muscle as needed.     Marland Kitchen escitalopram (LEXAPRO) 20 MG tablet Take 20 mg by mouth daily.    . midodrine (PROAMATINE) 2.5 MG tablet Take 1 tablet (2.5 mg total) by mouth 3 (three) times daily with meals. 90 tablet 11  . pindolol (VISKEN) 5 MG tablet Take 0.5 tablets (2.5 mg total) by mouth 2 (two) times daily. Start with once daily, increase to twice daily if tolerated. 30 tablet 11  . [DISCONTINUED] gabapentin (NEURONTIN) 100 MG capsule Take 100 mg by mouth at bedtime.       No current facility-administered medications on file prior to visit.         Objective:   Physical Exam Blood pressure 126/80, pulse 84, temperature 98.5 F (36.9 C), height 5\' 1"  (1.549 m), weight 215 lb 3.2 oz (97.6 kg), last menstrual period 10/25/2017. Alert and oriented. Skin warm and dry. Oral mucosa is moist.   . Sclera anicteric, conjunctivae is pink. Thyroid not enlarged. No cervical lymphadenopathy. Lungs clear. Heart regular rate and rhythm.  Abdomen is soft. Bowel sounds are positive. No hepatomegaly. No abdominal masses felt. Slight tenderness.all over.   No edema to lower extremities.           Assessment & Plan:  Enterocolitis which has now resolved. Will get a CBC.  She is 80% better.

## 2017-11-21 ENCOUNTER — Telehealth (INDEPENDENT_AMBULATORY_CARE_PROVIDER_SITE_OTHER): Payer: Self-pay | Admitting: Internal Medicine

## 2017-11-21 ENCOUNTER — Ambulatory Visit (INDEPENDENT_AMBULATORY_CARE_PROVIDER_SITE_OTHER): Payer: Medicaid Other | Admitting: Advanced Practice Midwife

## 2017-11-21 ENCOUNTER — Other Ambulatory Visit (HOSPITAL_COMMUNITY)
Admission: RE | Admit: 2017-11-21 | Discharge: 2017-11-21 | Disposition: A | Discharge status: Home / Self Care | Payer: Medicaid Other | Source: Ambulatory Visit | Attending: Advanced Practice Midwife | Admitting: Advanced Practice Midwife

## 2017-11-21 ENCOUNTER — Encounter: Payer: Self-pay | Admitting: Advanced Practice Midwife

## 2017-11-21 DIAGNOSIS — Z309 Encounter for contraceptive management, unspecified: Secondary | ICD-10-CM

## 2017-11-21 DIAGNOSIS — Z01419 Encounter for gynecological examination (general) (routine) without abnormal findings: Secondary | ICD-10-CM | POA: Insufficient documentation

## 2017-11-21 DIAGNOSIS — K588 Other irritable bowel syndrome: Secondary | ICD-10-CM

## 2017-11-21 MED ORDER — DICYCLOMINE HCL 10 MG PO CAPS: 10.0000 mg | ORAL_CAPSULE | Freq: Three times a day (TID) | ORAL | 3 refills | Status: DC

## 2017-11-21 NOTE — Telephone Encounter (Signed)
OV in 6 weeks. 

## 2017-11-21 NOTE — Progress Notes (Signed)
[name] [name] 32 y.o.   Past Medical History: Past Medical History:  Diagnosis Date  . Anxiety   . Depression   . DUB (dysfunctional uterine bleeding)   . Dysautonomia (HCC)   . History of echocardiogram    Echo 2/18: EF 60-65, no RWMA  . History of exercise stress test    a. ETT 1/18: Ex 5', no ischemic changes, no arrhythmias.  . Palpitations    Event Monitor 12/17: NSR, no dysrhythmias  . Panic attacks   . POTS (postural orthostatic tachycardia syndrome)   . Schizoaffective disorder, bipolar type (HCC)    With PTSD  . SVT (supraventricular tachycardia) (HCC) 2004   S/P slow path modification    Past Surgical History: Past Surgical History:  Procedure Laterality Date  . CESAREAN SECTION    . TUBAL LIGATION    . VENTRICULAR ABLATION SURGERY     For supraventricular tachycardia    Family History: Family History  Problem Relation Age of Onset  . Heart failure Mother   . Heart disease Mother   . Hypertension Mother   . Diabetes Mother   . Heart failure Father   . Hypertension Other   . Diabetes Other   . Depression Other     Social History: Social History   Tobacco Use  . Smoking status: Never Smoker  . Smokeless tobacco: Never Used  Substance Use Topics  . Alcohol use: No    Comment: Denies  . Drug use: No    Comment: Denies    Allergies:  Allergies  Allergen Reactions  . 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 Isoflavones Hives  . Shellfish Allergy Hives  . Other Hives  . Soy Allergy Hives      Current Outpatient Medications:  .  clonazePAM (KLONOPIN) 0.5 MG tablet, Take 0.5 tablets (0.25 mg total) by mouth 2 (two) times daily., Disp: 30 tablet, Rfl: 3 .  diphenhydrAMINE (BENADRYL) 25 mg capsule, Take 25 mg by mouth every 6 (six) hours as needed for itching., Disp: , Rfl:  .  escitalopram  (LEXAPRO) 20 MG tablet, Take 20 mg by mouth daily., Disp: , Rfl:  .  midodrine (PROAMATINE) 2.5 MG tablet, Take 1 tablet (2.5 mg total) by mouth 3 (three) times daily with meals., Disp: 90 tablet, Rfl: 11 .  dicyclomine (BENTYL) 10 MG capsule, Take 1 capsule (10 mg total) by mouth 3 (three) times daily before meals. (Patient not taking: Reported on 11/21/2017), Disp: 90 capsule, Rfl: 3 .  EPINEPHrine 0.3 mg/0.3 mL IJ SOAJ injection, Inject 0.3 mg into the muscle as needed. , Disp: , Rfl:  .  pindolol (VISKEN) 5 MG tablet, Take 0.5 tablets (2.5 mg total) by mouth 2 (two) times daily. Start with once daily, increase to twice daily if tolerated. (Patient not taking: Reported on 11/21/2017), Disp: 30 tablet, Rfl: 11  History of Present Illness: Here for pap.  hx of abnormal pap w/cryo 2005, last pap 2016, normal.    Review of Systems   Patient denies any headaches,shortness of breath, chest pain, abdominal pain, problems with bowel movements, urination. Some pain w/intercourse, presumably from adhesions. Periods are heavy, painful.    Physical Exam: General:  Well developed, well nourished, no acute distress Skin:  Warm and dry Neck:  Midline trachea, normal thyroid Lungs; Clear to auscultation bilaterally Breast:  No dominant  palpable mass, retraction, or nipple discharge Cardiovascular: Regular rate and rhythm Abdomen:  Soft, non tender, no hepatosplenomegaly Pelvic:  External genitalia is normal in appearance.  The vagina is normal in appearance.  The cervix is bulbous.  Uterus is felt to be normal size, shape, and contour.  No adnexal masses or tenderness noted. Exam limited by habitus.  Extremities:  No swelling or varicosities noted  Impression: normal GYN exam Menorrhagia, dysmenorrha     Plan: if pap is normal q 3 years. + Apply for [employer] discount so maybe can get hyst d/t dyspareunia/adhesions

## 2017-11-21 NOTE — Addendum Note (Signed)
Addended by: Federico FlakeNES, Charda Janis A on: 11/21/2017 03:19 PM   Modules accepted: Orders

## 2017-11-21 NOTE — Telephone Encounter (Signed)
Needs OV in 6 weeks 

## 2017-11-25 LAB — CYTOLOGY - PAP
CHLAMYDIA, DNA PROBE: NEGATIVE
DIAGNOSIS: NEGATIVE
HPV: NOT DETECTED
NEISSERIA GONORRHEA: NEGATIVE

## 2018-01-06 ENCOUNTER — Encounter (INDEPENDENT_AMBULATORY_CARE_PROVIDER_SITE_OTHER): Payer: Self-pay | Admitting: Internal Medicine

## 2018-01-06 ENCOUNTER — Ambulatory Visit (INDEPENDENT_AMBULATORY_CARE_PROVIDER_SITE_OTHER): Payer: Medicaid Other | Admitting: Internal Medicine

## 2018-05-27 ENCOUNTER — Emergency Department (HOSPITAL_COMMUNITY)
Admission: EM | Admit: 2018-05-27 | Discharge: 2018-05-27 | Disposition: A | Discharge status: Home / Self Care | Payer: Self-pay | Attending: Emergency Medicine | Admitting: Emergency Medicine

## 2018-05-27 ENCOUNTER — Encounter (HOSPITAL_COMMUNITY): Payer: Self-pay | Admitting: Emergency Medicine

## 2018-05-27 ENCOUNTER — Other Ambulatory Visit: Payer: Self-pay

## 2018-05-27 ENCOUNTER — Emergency Department (HOSPITAL_COMMUNITY): Payer: Self-pay

## 2018-05-27 DIAGNOSIS — R590 Localized enlarged lymph nodes: Secondary | ICD-10-CM | POA: Insufficient documentation

## 2018-05-27 DIAGNOSIS — Z79899 Other long term (current) drug therapy: Secondary | ICD-10-CM | POA: Insufficient documentation

## 2018-05-27 DIAGNOSIS — R42 Dizziness and giddiness: Secondary | ICD-10-CM | POA: Insufficient documentation

## 2018-05-27 LAB — CBC
HCT: 40.1 % (ref 36.0–46.0)
Hemoglobin: 12.6 g/dL (ref 12.0–15.0)
MCH: 27.9 pg (ref 26.0–34.0)
MCHC: 31.4 g/dL (ref 30.0–36.0)
MCV: 88.7 fL (ref 80.0–100.0)
NRBC: 0 % (ref 0.0–0.2)
Platelets: 317 10*3/uL (ref 150–400)
RBC: 4.52 MIL/uL (ref 3.87–5.11)
RDW: 13.7 % (ref 11.5–15.5)
WBC: 9.6 10*3/uL (ref 4.0–10.5)

## 2018-05-27 LAB — BASIC METABOLIC PANEL
ANION GAP: 9 (ref 5–15)
BUN: 11 mg/dL (ref 6–20)
CALCIUM: 8.8 mg/dL — AB (ref 8.9–10.3)
CO2: 20 mmol/L — ABNORMAL LOW (ref 22–32)
Chloride: 109 mmol/L (ref 98–111)
Creatinine, Ser: 0.72 mg/dL (ref 0.44–1.00)
GFR calc non Af Amer: >60 mL/min (ref >60)
GLUCOSE: 113 mg/dL — AB (ref 70–99)
POTASSIUM: 3.7 mmol/L (ref 3.5–5.1)
SODIUM: 138 mmol/L (ref 135–145)

## 2018-05-27 LAB — URINALYSIS, ROUTINE W REFLEX MICROSCOPIC
Bilirubin Urine: NEGATIVE
Glucose, UA: NEGATIVE mg/dL
Hgb urine dipstick: NEGATIVE
KETONES UR: NEGATIVE mg/dL
LEUKOCYTES UA: NEGATIVE
NITRITE: NEGATIVE
PROTEIN: NEGATIVE mg/dL
Specific Gravity, Urine: 1.011 (ref 1.005–1.030)
pH: 6 (ref 5.0–8.0)

## 2018-05-27 MED ORDER — LORAZEPAM 1 MG PO TABS: 1.0000 mg | ORAL_TABLET | Freq: Two times a day (BID) | ORAL | 0 refills | Status: DC | PRN

## 2018-05-27 MED ORDER — MECLIZINE HCL 25 MG PO TABS: 25.0000 mg | ORAL_TABLET | Freq: Three times a day (TID) | ORAL | 0 refills | Status: DC | PRN

## 2018-05-27 NOTE — Discharge Instructions (Addendum)
Tests were all normal.  Scription for dizziness medication and medication for restlessness.  If the lymph gland in your groin persists, will need further follow-up by your primary care doctor.

## 2018-05-27 NOTE — ED Triage Notes (Signed)
Pt c/o of dizziness, weakness and a "lump" in the groin.

## 2018-05-29 NOTE — ED Provider Notes (Addendum)
[facility]Y [facility]PARTMENT Provider Note   CSN: [id number] Arrival date & time: 05/27/18  1554     History   Chief Complaint Chief Complaint  Patient presents with  . Dizziness    HPI [name] [name] is a 32 y.o. female.  Dizziness and weakness for the past 2 to 3 days.  Patient feels off balance while walking.  No specific fever, sweats, chills, dysuria, chest pain, dyspnea, gross neurological deficits.  Past medical history includes SVT which led to an ablation and POTS syndrome.  She has had a BTL.  Review of systems positive for concern of mass in the left inguinal area.  Severity of symptoms is moderate.  Ambulation makes dizziness worse     Past Medical History:  Diagnosis Date  . Anxiety   . Depression   . DUB (dysfunctional uterine bleeding)   . Dysautonomia (HCC)   . History of echocardiogram    Echo 2/18: EF 60-65, no RWMA  . History of exercise stress test    a. ETT 1/18: Ex 5', no ischemic changes, no arrhythmias.  . Palpitations    Event Monitor 12/17: NSR, no dysrhythmias  . Panic attacks   . POTS (postural orthostatic tachycardia syndrome)   . Schizoaffective disorder, bipolar type (HCC)    With PTSD  . SVT (supraventricular tachycardia) (HCC) 2004   S/P slow path modification    Patient Active Problem List   Diagnosis Date Noted  . Syncope 02/06/2017  . Numbness 02/06/2017  . Insomnia 02/06/2017  . Schizoaffective disorder, bipolar type (HCC)   . Obesity (BMI 35.0-39.9 without comorbidity) 02/24/2014  . Dysautonomia (HCC) 12/21/2010  . 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 UNSPECIFIED HYPOTENSION 02/23/2009  . Tachycardia 02/23/2009    Past Surgical History:  Procedure Laterality Date  . CESAREAN SECTION    . TUBAL LIGATION    . VENTRICULAR ABLATION SURGERY     For supraventricular tachycardia     OB History    Gravida  3   Para  3   Term  3   Preterm      AB      Living        SAB      TAB       Ectopic      Multiple      Live Births               Home Medications    Prior to Admission medications   Medication Sig Start Date End Date Taking? Authorizing Provider  diphenhydrAMINE (BENADRYL) 25 mg capsule Take 25 mg by mouth every 6 (six) hours as needed for itching.   Yes [provider]  EPINEPHrine 0.3 mg/0.3 mL IJ SOAJ injection Inject 0.3 mg into the muscle once.    Yes [provider]  LORazepam (ATIVAN) 1 MG tablet Take 1 tablet (1 mg total) by mouth 2 (two) times daily as needed for anxiety. 05/27/18   Donnetta Hutchingook, Reneisha Stilley, MD  meclizine (ANTIVERT) 25 MG tablet Take 1 tablet (25 mg total) by mouth 3 (three) times daily as needed for dizziness. 05/27/18   Donnetta Hutchingook, Jenell Dobransky, MD  gabapentin (NEURONTIN) 100 MG capsule Take 100 mg by mouth at bedtime.    09/03/11  [provider]    Family History Family History  Problem Relation Age of Onset  . Heart failure Mother   . Heart disease Mother   . Hypertension Mother   . Diabetes Mother   . Heart failure  Father   . Hypertension Other   . Diabetes Other   . Depression Other     Social History Social History   Tobacco Use  . Smoking status: Never Smoker  . Smokeless tobacco: Never Used  Substance Use Topics  . Alcohol use: No    Comment: Denies  . Drug use: No    Comment: Denies     Allergies   Vancomycin; Isoflavones; Shellfish allergy; and Soy allergy   Review of Systems Review of Systems  All other systems reviewed and are negative.    Physical Exam Updated Vital Signs BP (!) 105/56   Pulse 87   Temp 98.4 F (36.9 C) (Temporal)   Resp 16   Ht 5\' 1"  (1.549 m)   Wt 97.5 kg   SpO2 98%   BMI 40.62 kg/m   Physical Exam Vitals signs and nursing note reviewed.  Constitutional:      Appearance: She is well-developed.  HENT:     Head: Normocephalic and atraumatic.  Eyes:     Conjunctiva/sclera: Conjunctivae normal.     Comments: Vision grossly intact.  Neck:      Musculoskeletal: Neck supple.  Cardiovascular:     Rate and Rhythm: Normal rate and regular rhythm.  Pulmonary:     Effort: Pulmonary effort is normal.     Breath sounds: Normal breath sounds.  Abdominal:     General: Bowel sounds are normal.     Palpations: Abdomen is soft.  Musculoskeletal: Normal range of motion.  Skin:    General: Skin is warm and dry.  Neurological:     Mental Status: She is alert and oriented to person, place, and time.     Comments: No gross motor he, sensory, cerebellar deficits.  Psychiatric:        Behavior: Behavior normal.      ED Treatments / Results  Labs (all labs ordered are listed, but only abnormal results are displayed) Labs Reviewed  BASIC METABOLIC PANEL - Abnormal; Notable for the following components:      Result Value   CO2 20 (*)    Glucose, Bld 113 (*)    Calcium 8.8 (*)    All other components within normal limits  URINALYSIS, ROUTINE W REFLEX MICROSCOPIC - Abnormal; Notable for the following components:   APPearance HAZY (*)    All other components within normal limits  CBC    EKG EKG Interpretation  Date/Time:  Tuesday May 27 2018 15:59:14 EST Ventricular Rate:  110 PR Interval:  128 QRS Duration: 84 QT Interval:  322 QTC Calculation: 435 R Axis:   61 Text Interpretation:  Sinus tachycardia Nonspecific T wave abnormality Abnormal ECG Confirmed by Donnetta Hutchingook, Traci Plemons ([id number]) on 05/27/2018 7:44:41 PM   Radiology Ct Head Wo Contrast  Result Date: 05/27/2018 CLINICAL DATA:  Dizziness and weakness. EXAM: CT HEAD WITHOUT CONTRAST TECHNIQUE: Contiguous axial images were obtained from the base of the skull through the vertex without intravenous contrast. COMPARISON:  MRI 03/02/2017 FINDINGS: Brain: The ventricles are normal in size and configuration. No extra-axial fluid collections are identified. The gray-white differentiation is maintained. No CT findings for acute hemispheric infarction or intracranial hemorrhage. No mass  lesions. The brainstem and cerebellum are normal. Vascular: No hyperdense vessels or obvious aneurysm. Skull: No acute skull fracture.  No bone lesion. Sinuses/Orbits: The paranasal sinuses and mastoid air cells are clear. The globes are intact. Other: No scalp lesions, laceration or hematoma. IMPRESSION: Normal head CT. Electronically Signed   By: Demetrius CharityP.  Gallerani M.D.   On: 05/27/2018 19:18    Procedures Procedures (including critical care time)  Medications Ordered in ED Medications - No data to display   Initial Impression / Assessment and Plan / ED Course  I have reviewed the triage vital signs and the nursing notes.  Pertinent labs & imaging results that were available during my care of the patient were reviewed by me and considered in my medical decision making (see chart for details).    Patient presents with dizziness and weakness.  No obvious neurological deficits.  Labs unremarkable.  CT head negative.  Suspect benign positional vertigo.  Discharge medications Antivert 25 mg and Ativan 1 mg.  All tests reviewed with patient and her husband.  Pulse has normalized at discharge.  Final Clinical Impressions(s) / ED Diagnoses   Final diagnoses:  Dizziness  Inguinal adenopathy    ED Discharge Orders         Ordered    meclizine (ANTIVERT) 25 MG tablet  3 times daily PRN     05/27/18 2224    LORazepam (ATIVAN) 1 MG tablet  2 times daily PRN     05/27/18 2224           Donnetta Hutching, MD 05/29/18 1044    Donnetta Hutching, MD 05/29/18 1045

## 2019-04-23 ENCOUNTER — Encounter: Payer: Self-pay | Admitting: Adult Health

## 2019-04-23 ENCOUNTER — Other Ambulatory Visit (HOSPITAL_COMMUNITY)
Admission: RE | Admit: 2019-04-23 | Discharge: 2019-04-23 | Disposition: A | Discharge status: Home / Self Care | Payer: Medicaid Other | Source: Ambulatory Visit | Attending: Adult Health | Admitting: Adult Health

## 2019-04-23 ENCOUNTER — Other Ambulatory Visit: Payer: Self-pay

## 2019-04-23 ENCOUNTER — Ambulatory Visit (INDEPENDENT_AMBULATORY_CARE_PROVIDER_SITE_OTHER): Payer: Self-pay | Admitting: Adult Health

## 2019-04-23 VITALS — BP 127/86 | Ht 61.0 in | Wt 220.6 lb

## 2019-04-23 DIAGNOSIS — N943 Premenstrual tension syndrome: Secondary | ICD-10-CM | POA: Insufficient documentation

## 2019-04-23 DIAGNOSIS — R635 Abnormal weight gain: Secondary | ICD-10-CM | POA: Insufficient documentation

## 2019-04-23 DIAGNOSIS — Z3009 Encounter for other general counseling and advice on contraception: Secondary | ICD-10-CM | POA: Diagnosis present

## 2019-04-23 DIAGNOSIS — Z0000 Encounter for general adult medical examination without abnormal findings: Secondary | ICD-10-CM

## 2019-04-23 DIAGNOSIS — Z113 Encounter for screening for infections with a predominantly sexual mode of transmission: Secondary | ICD-10-CM

## 2019-04-23 DIAGNOSIS — N6452 Nipple discharge: Secondary | ICD-10-CM | POA: Insufficient documentation

## 2019-04-23 DIAGNOSIS — Z01419 Encounter for gynecological examination (general) (routine) without abnormal findings: Secondary | ICD-10-CM

## 2019-04-23 DIAGNOSIS — N920 Excessive and frequent menstruation with regular cycle: Secondary | ICD-10-CM

## 2019-04-23 DIAGNOSIS — L659 Nonscarring hair loss, unspecified: Secondary | ICD-10-CM

## 2019-04-23 DIAGNOSIS — R413 Other amnesia: Secondary | ICD-10-CM | POA: Insufficient documentation

## 2019-04-23 NOTE — Progress Notes (Signed)
Patient ID: [name] [name], female   DOB: 05-06-1986, 33 y.o.   MRN: [id number] History of Present Illness: [name] [name] is a 33 year old white female, married, G3P3 in for a well woman gyn exam, she had normal pap with negative HPV 11/21/17.She has [employer] and wants to apply for [employer] assistance. She has multiple complaints: +breast discharge yesterday that was brownish gray and sticky,has breast tenderness bilaterally before period, PMS, weight gain, hair loss, memory loss, and heavy periods  She says she was told she was bipolar once, and has found jesus and is better.  PCP is [facility] in [place].   Current Medications, Allergies, Past Medical History, Past Surgical History, Family History and Social History were reviewed in [employer] record.     Review of Systems: Patient denies any headaches, hearing loss, fatigue, blurred vision, shortness of breath, chest pain, abdominal pain, problems with bowel movements, urination, or intercourse. No joint pain. She HPI for positives.     Physical Exam:BP 127/86 (BP Location: Right Wrist, Patient Position: Sitting, Cuff Size: Large)   Ht 5\' 1"  (1.549 m)   Wt 220 lb 9.6 oz (100.1 kg)   LMP 04/09/2019 (Exact Date)   BMI 41.68 kg/m  General:  Well developed, well nourished, no acute distress Skin:  Warm and dry,hair is thin Neck:  Midline trachea, normal thyroid, good ROM, no lymphadenopathy Lungs; Clear to auscultation bilaterally Breast:  No dominant palpable mass, retraction, or nipple discharge today Cardiovascular: Regular rate and rhythm Abdomen:  Soft, non tender, no hepatosplenomegaly Pelvic:  External genitalia is normal in appearance, no lesions.  The vagina is normal in appearance. Urethra has no lesions or masses. The cervix is bulbous. CV swab obtained, by Weyman Croon FNP student. Uterus is felt to be normal size, shape, and contour.  No adnexal masses or tenderness noted.Bladder is non  tender, no masses felt. Extremities/musculoskeletal:  No swelling or varicosities noted, no clubbing or cyanosis Psych:  No mood changes, alert and cooperative,seems happy Fall risk is low PHQ 2 score is 0.   Impression and Plan: 1. Encounter for well woman exam with routine gynecological exam  2. Nipple discharge Check TSH and prolactin, when has not manipulated the breast  3. Weight gain Check TSH  4. Hair loss Check TSH  5. Menorrhagia with regular cycle  6. Family planning CV swab sent for GC/CHL Check HIV and RPR, will talk when labs back   7. Screening examination for STD (sexually transmitted disease) Check HIV and RPR CV swab sent for GC/CHL  8. PMS (premenstrual syndrome) She declines meds  9. Memory loss May send to neurology, apply for [facility]

## 2019-04-27 LAB — CERVICOVAGINAL ANCILLARY ONLY
Chlamydia: NEGATIVE
Comment: NEGATIVE
Comment: NORMAL
Neisseria Gonorrhea: NEGATIVE

## 2019-04-28 ENCOUNTER — Telehealth: Payer: Self-pay | Admitting: *Deleted

## 2019-04-28 LAB — RPR: RPR Ser Ql: NONREACTIVE

## 2019-04-28 LAB — PROLACTIN: Prolactin: 10.6 ng/mL (ref 4.8–23.3)

## 2019-04-28 LAB — TSH: TSH: 1.79 u[IU]/mL (ref 0.450–4.500)

## 2019-04-28 LAB — HIV ANTIBODY (ROUTINE TESTING W REFLEX): HIV Screen 4th Generation wRfx: NONREACTIVE

## 2019-04-28 NOTE — Telephone Encounter (Signed)
Pt aware all labs are normal. Pt voiced understanding. [place]

## 2019-04-28 NOTE — Telephone Encounter (Signed)
-----   Message from Estill Dooms, NP sent at 04/28/2019  1:37 PM EST ----- Let her know that labs all good

## 2019-04-30 ENCOUNTER — Telehealth: Payer: Self-pay | Admitting: *Deleted

## 2019-04-30 NOTE — Telephone Encounter (Signed)
Patient left message that she wants to schedule ultrasound of her uterus.

## 2019-04-30 NOTE — Telephone Encounter (Signed)
Mailbox is full, if she calls can make [place] appt

## 2019-09-16 ENCOUNTER — Emergency Department (HOSPITAL_COMMUNITY): Payer: Self-pay

## 2019-09-16 ENCOUNTER — Emergency Department (HOSPITAL_COMMUNITY)
Admission: EM | Admit: 2019-09-16 | Discharge: 2019-09-17 | Disposition: A | Discharge status: Home / Self Care | Payer: Self-pay | Attending: Emergency Medicine | Admitting: Emergency Medicine

## 2019-09-16 ENCOUNTER — Encounter (HOSPITAL_COMMUNITY): Payer: Self-pay

## 2019-09-16 ENCOUNTER — Other Ambulatory Visit: Payer: Self-pay

## 2019-09-16 DIAGNOSIS — R002 Palpitations: Secondary | ICD-10-CM | POA: Insufficient documentation

## 2019-09-16 DIAGNOSIS — R079 Chest pain, unspecified: Secondary | ICD-10-CM

## 2019-09-16 DIAGNOSIS — R0602 Shortness of breath: Secondary | ICD-10-CM | POA: Insufficient documentation

## 2019-09-16 DIAGNOSIS — R42 Dizziness and giddiness: Secondary | ICD-10-CM | POA: Insufficient documentation

## 2019-09-16 DIAGNOSIS — Z79899 Other long term (current) drug therapy: Secondary | ICD-10-CM | POA: Insufficient documentation

## 2019-09-16 LAB — BASIC METABOLIC PANEL
Anion gap: 7 (ref 5–15)
BUN: 18 mg/dL (ref 6–20)
CO2: 23 mmol/L (ref 22–32)
Calcium: 8.2 mg/dL — ABNORMAL LOW (ref 8.9–10.3)
Chloride: 105 mmol/L (ref 98–111)
Creatinine, Ser: 0.76 mg/dL (ref 0.44–1.00)
GFR calc Af Amer: >60 mL/min (ref >60)
GFR calc non Af Amer: >60 mL/min (ref >60)
Glucose, Bld: 130 mg/dL — ABNORMAL HIGH (ref 70–99)
Potassium: 3.1 mmol/L — ABNORMAL LOW (ref 3.5–5.1)
Sodium: 135 mmol/L (ref 135–145)

## 2019-09-16 LAB — CBC
HCT: 39.1 % (ref 36.0–46.0)
Hemoglobin: 12.4 g/dL (ref 12.0–15.0)
MCH: 29.2 pg (ref 26.0–34.0)
MCHC: 31.7 g/dL (ref 30.0–36.0)
MCV: 92.2 fL (ref 80.0–100.0)
Platelets: 309 10*3/uL (ref 150–400)
RBC: 4.24 MIL/uL (ref 3.87–5.11)
RDW: 13.3 % (ref 11.5–15.5)
WBC: 10.4 10*3/uL (ref 4.0–10.5)
nRBC: 0 % (ref 0.0–0.2)

## 2019-09-16 LAB — TROPONIN I (HIGH SENSITIVITY): Troponin I (High Sensitivity): 2 ng/L (ref <18)

## 2019-09-16 LAB — HCG, QUANTITATIVE, PREGNANCY: hCG, Beta Chain, Quant, S: <1 m[IU]/mL (ref <5)

## 2019-09-16 MED ORDER — SODIUM CHLORIDE 0.9 % IV BOLUS
1000.0000 mL | Freq: Once | INTRAVENOUS | Status: AC
  Administered 2019-09-16: 1000 mL via INTRAVENOUS

## 2019-09-16 MED ORDER — POTASSIUM CHLORIDE CRYS ER 20 MEQ PO TBCR
40.0000 meq | EXTENDED_RELEASE_TABLET | Freq: Once | ORAL | Status: AC
  Administered 2019-09-16: 40 meq via ORAL
  Filled 2019-09-16: qty 2

## 2019-09-16 NOTE — ED Provider Notes (Signed)
[facility] EMERGENCY DEPARTMENT Provider Note   CSN: [id number] Arrival date & time: 09/16/19  2123     History Chief Complaint  Patient presents with  . Palpitations    [name] [name] is a 34 y.o. female.  HPI   Patient is a 34 year old female with a history of anxiety/depression, dysfunctional uterine bleeding, dysautonomia, pots, schizoaffective disorder, SVT s/p ablation who presents the emergency department today for evaluation of palpitations.  States this has occurred intermittently and started about 2 days ago.  States it feels like her heart is racing and is beating out of her chest.  States it feels similar to when she had episodes from her pots syndrome. she also reports that she gets somewhat short of breath with this and has some chest tightness/pressure when it occurs.  She denies any pleuritic pain, coughing, hemoptysis, use of birth control, bilateral lower extremity swelling, calf pain, recent admissions or extended periods of travel greater than 4 hours long.  Denies any history of cancer or VTE.  Denies any use of over-the-counter supplements or any increased caffeine intake.  Past Medical History:  Diagnosis Date  . Anxiety   . Depression   . DUB (dysfunctional uterine bleeding)   . Dysautonomia (HCC)   . History of echocardiogram    Echo 2/18: EF 60-65, no RWMA  . History of exercise stress test    a. ETT 1/18: Ex 5', no ischemic changes, no arrhythmias.  . Palpitations    Event Monitor 12/17: NSR, no dysrhythmias  . Panic attacks   . POTS (postural orthostatic tachycardia syndrome)   . Schizoaffective disorder, bipolar type (HCC)    With PTSD  . SVT (supraventricular tachycardia) (HCC) 2004   S/P slow path modification    Patient Active Problem List   Diagnosis Date Noted  . Breast discharge 04/23/2019  . Nipple discharge 04/23/2019  . Encounter for well woman exam with routine gynecological exam 04/23/2019  . Menorrhagia with regular cycle  04/23/2019  . Hair loss 04/23/2019  . Weight gain 04/23/2019  . Memory loss 04/23/2019  . PMS (premenstrual syndrome) 04/23/2019  . Screening examination for STD (sexually transmitted disease) 04/23/2019  . Family planning 04/23/2019  . Syncope 02/06/2017  . Numbness 02/06/2017  . Insomnia 02/06/2017  . Schizoaffective disorder, bipolar type (HCC)   . Obesity (BMI 35.0-39.9 without comorbidity) 02/24/2014  . Dysautonomia (HCC) 12/21/2010  . 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 UNSPECIFIED HYPOTENSION 02/23/2009  . Tachycardia 02/23/2009    Past Surgical History:  Procedure Laterality Date  . CESAREAN SECTION    . TUBAL LIGATION    . VENTRICULAR ABLATION SURGERY     For supraventricular tachycardia     OB History    Gravida  3   Para  3   Term  3   Preterm      AB      Living        SAB      TAB      Ectopic      Multiple      Live Births              Family History  Problem Relation Age of Onset  . Heart failure Mother   . Heart disease Mother   . Hypertension Mother   . Diabetes Mother   . Heart failure Father   . Hypertension Other   . Diabetes Other   . Depression Other     Social History  Tobacco Use  . Smoking status: Never Smoker  . Smokeless tobacco: Never Used  Substance Use Topics  . Alcohol use: No  . Drug use: No    Home Medications Prior to Admission medications   Medication Sig Start Date End Date Taking? Authorizing Provider  diphenhydrAMINE (BENADRYL) 25 mg capsule Take 25 mg by mouth every 6 (six) hours as needed for itching.   Yes [provider]  Multiple Vitamins-Minerals (HAIR SKIN & NAILS ADVANCED) TABS Take 1 tablet by mouth daily.   Yes [provider]  naproxen sodium (ALEVE) 220 MG tablet Take 440 mg by mouth daily as needed (for pain).   Yes [provider]  gabapentin (NEURONTIN) 100 MG capsule Take 100 mg by mouth at bedtime.    09/03/11  [provider]    Allergies    Shellfish allergy, Vancomycin, Soy isoflavones, and Soy allergy  Review of Systems   Review of Systems  Constitutional: Negative for fever.  HENT: Negative for ear pain and sore throat.   Eyes: Negative for photophobia.  Respiratory: Positive for shortness of breath. Negative for cough.   Cardiovascular: Positive for chest pain and leg swelling.  Gastrointestinal: Negative for abdominal pain, constipation, diarrhea, nausea and vomiting.  Genitourinary: Negative for dysuria and hematuria.  Musculoskeletal: Negative for back pain.  Skin: Negative for rash.  Neurological: Positive for light-headedness. Negative for weakness and numbness.  All other systems reviewed and are negative.   Physical Exam Updated Vital Signs BP 135/87 (BP Location: Right Arm)   Pulse 94   Temp 98.4 F (36.9 C) (Oral)   Resp 20   Ht 5\' 2"  (1.575 m)   Wt 91.6 kg   SpO2 99%   BMI 36.95 kg/m   Physical Exam Vitals and nursing note reviewed.  Constitutional:      General: She is not in acute distress.    Appearance: She is well-developed.  HENT:     Head: Normocephalic and atraumatic.  Eyes:     Conjunctiva/sclera: Conjunctivae normal.  Cardiovascular:     Rate and Rhythm: Normal rate and regular rhythm.     Pulses: Normal pulses.     Heart sounds: Normal heart sounds. No murmur.     Comments: Intermittently marginally tachycardic on monitor Pulmonary:     Effort: Pulmonary effort is normal. No respiratory distress.     Breath sounds: Normal breath sounds. No wheezing, rhonchi or rales.  Abdominal:     General: Bowel sounds are normal.     Palpations: Abdomen is soft.     Tenderness: There is no abdominal tenderness.  Musculoskeletal:        General: No tenderness.     Cervical back: Neck supple.     Right lower leg: No edema.     Left lower leg: No edema.  Skin:    General: Skin is warm and dry.  Neurological:     Mental Status: She is alert.     ED  Results / Procedures / Treatments   Labs (all labs ordered are listed, but only abnormal results are displayed) Labs Reviewed  BASIC METABOLIC PANEL - Abnormal; Notable for the following components:      Result Value   Potassium 3.1 (*)    Glucose, Bld 130 (*)    Calcium 8.2 (*)    All other components within normal limits  CBC  HCG, QUANTITATIVE, PREGNANCY  TROPONIN I (HIGH SENSITIVITY)    EKG EKG Interpretation  Date/Time:  Wednesday  September 16 2019 21:49:09 EDT Ventricular Rate:  103 PR Interval:    QRS Duration: 91 QT Interval:  337 QTC Calculation: 442 R Axis:   39 Text Interpretation: Sinus tachycardia No significant change since last tracing Confirmed by Cathren Laine ([id number]) on 09/16/2019 9:56:12 PM   Radiology DG Chest Port 1 View  Result Date: 09/16/2019 CLINICAL DATA:  34 year old female with chest pain. EXAM: PORTABLE CHEST 1 VIEW COMPARISON:  Chest radiograph dated 09/18/2014. FINDINGS: No focal consolidation, pleural effusion, pneumothorax. The cardiac silhouette is within limits. No acute osseous pathology. IMPRESSION: No active disease. Electronically Signed   By: Elgie Collard M.D.   On: 09/16/2019 22:28    Procedures Procedures (including critical care time)  12:38 AM Cardiac monitoring reveals NSR with HR at 84 (Rate & rhythm), as reviewed and interpreted by me. Cardiac monitoring was ordered due to palpitations and to monitor patient for dysrhythmia.   Medications Ordered in ED Medications  sodium chloride 0.9 % bolus 1,000 mL (1,000 mLs Intravenous New Bag/Given 09/16/19 2306)  potassium chloride SA (KLOR-CON) CR tablet 40 mEq (40 mEq Oral Given 09/16/19 2339)    ED Course  I have reviewed the triage vital signs and the nursing notes.  Pertinent labs & imaging results that were available during my care of the patient were reviewed by me and considered in my medical decision making (see chart for details).    MDM Rules/Calculators/A&P                       34 year old female with history of SVT, palpitations, pots syndrome, dysautonomia, who presents emergency department today complaining of palpitations.  Feels similar to prior episodes of POTS.  She is not currently on any medication for this as she has not followed up with her cardiologist in for some time.  She was marginally tachycardic, but otherwise vital signs are reassuring  Reviewed/interpreted CBC showed mild cytosis, no anemia BMP showed mild hypokalemia at 3.1 which is supplemented in the ED.  Otherwise, nonacute. Beta-hCG negative Troponin negative  EKG showed tachycardia with no changes from prior EKG.  Patient feeling improved after period of observation and IV fluids in the ED.  Her heart rate has also improved.  Suspect that this is related to her prior diagnosis of pots syndrome or could be related to her history of dysautonomia.  We will have her follow-up with her cardiologist for further work-up and management.  Have advised her to return to ED for new or worsening symptoms in the meantime.  She voiced understanding of the plan and reasons to return.  All questions answered.  Final Clinical Impression(s) / ED Diagnoses Final diagnoses:  Palpitations    Rx / DC Orders ED Discharge Orders    None       Karrie Meres, PA-C 09/17/19 0038    Cathren Laine, MD 09/17/19 1635

## 2019-09-16 NOTE — ED Triage Notes (Signed)
Pt to er, pt states that she has been having palpitations for the past three days, states that she was at work and they got worse and her bos told her to come to the er.  States that yesterday she felt like her heart rate was near 200, reports hx of svt.  States the palpitations are random, states that she has a hx of postural tachy.

## 2019-09-17 ENCOUNTER — Telehealth: Payer: Self-pay | Admitting: Internal Medicine

## 2019-09-17 NOTE — Telephone Encounter (Signed)
    I went in pt's chart to see when she was in the ER and what she was seen for in the ER. I made an appt for her on 09-24-19 with Vin.

## 2019-09-17 NOTE — Discharge Instructions (Signed)
Please call your cardiac please call your cardiologist to schedule an appointment for follow-up in regards to your symptoms today.  Please return to the emergency department for any new or worsening symptoms in the meantime

## 2019-09-22 NOTE — Progress Notes (Signed)
Cardiology Office Note    Date:  09/24/2019   ID:  [name] [name], DOB 22-Nov-1985, MRN [id number]  PCP:  System, Provider Not In  Cardiologist:  Dr. Harrington Challenger  Chief Complaint: ER follow up with CP and palpitations   History of Present Illness:   [name] [name] is a 34 y.o. female  with hx of autonomic dysfunction, remote ablation by Dr. Caryl Comes for SVT in 2004, anxiety and POTS presents for follow up.   ETT 06/2016 - walked only 2.4 min Peak HR 184 . Normal echo 06/2016. Recommended increase fluid and salt intake. Added pindolol 2.5mg  BID however stopped due to dizziness. Due to recurrent symptoms started midodrine.   Last seen by Dr. Ross>> increased midodrine and retrial of pinodolol.   Seen in ER for palpitations. Felt like POTS. Not on any medications. EKG with sinus tachycardia - personally reviewed. Symptoms improved with hydration.  She was also given K. Dur 40 mEq for potassium of 3.1.  Here for follow up.  For the past couple of week sees having intermittent palpitation and dizziness..  She has fatigue and tiredness.  She works at [employer] and on her feet all times.  Symptoms similar when she had a POTS.  Has not took medication for past 2 years.  History of SVT ablation with Dr. Caryl Comes.  No chest pain, orthopnea, PND, syncope or melena.   Past Medical History:  Diagnosis Date  . Anxiety   . Depression   . DUB (dysfunctional uterine bleeding)   . Dysautonomia ([place])   . History of echocardiogram    Echo 2/18: EF 60-65, no RWMA  . History of exercise stress test    a. ETT 1/18: Ex 5', no ischemic changes, no arrhythmias.  . Palpitations    Event Monitor 12/17: NSR, no dysrhythmias  . Panic attacks   . POTS (postural orthostatic tachycardia syndrome)   . Schizoaffective disorder, bipolar type ([place])    With PTSD  . SVT (supraventricular tachycardia) ([place]) 2004   S/P slow path modification    Past Surgical History:  Procedure Laterality Date  . CESAREAN SECTION    .  TUBAL LIGATION    . VENTRICULAR ABLATION SURGERY     For supraventricular tachycardia    Current Medications: Prior to Admission medications   Medication Sig Start Date End Date Taking? Authorizing Provider  diphenhydrAMINE (BENADRYL) 25 mg capsule Take 25 mg by mouth every 6 (six) hours as needed for itching.    [provider]  Multiple Vitamins-Minerals (HAIR SKIN & NAILS ADVANCED) TABS Take 1 tablet by mouth daily.    [provider]  naproxen sodium (ALEVE) 220 MG tablet Take 440 mg by mouth daily as needed (for pain).    [provider]  gabapentin (NEURONTIN) 100 MG capsule Take 100 mg by mouth at bedtime.    09/03/11  [provider]    Allergies:   Shellfish allergy, Vancomycin, Soy isoflavones, and Soy allergy   Social History   Socioeconomic History  . Marital status: Married    Spouse name: Not on file  . Number of children: Not on file  . Years of education: Not on file  . Highest education level: Not on file  Occupational History  . Not on file  Tobacco Use  . Smoking status: Never Smoker  . Smokeless tobacco: Never Used  Substance and Sexual Activity  . Alcohol use: No  . Drug use: No  . Sexual activity: Not on file  Comment: tubal  Other Topics Concern  . Not on file  Social History Narrative  . Not on file   Social Determinants of Health   Financial Resource Strain:   . Difficulty of Paying Living Expenses:   Food Insecurity:   . Worried About Programme researcher, broadcasting/film/video in the Last Year:   . Barista in the Last Year:   Transportation Needs:   . Freight forwarder (Medical):   Marland Kitchen Lack of Transportation (Non-Medical):   Physical Activity:   . Days of Exercise per Week:   . Minutes of Exercise per Session:   Stress:   . Feeling of Stress :   Social Connections:   . Frequency of Communication with Friends and Family:   . Frequency of Social Gatherings with Friends and Family:   . Attends Religious Services:    . Active Member of Clubs or Organizations:   . Attends Banker Meetings:   Marland Kitchen Marital Status:      Family History:  The patient's family history includes Depression in an other family member; Diabetes in her mother and another family member; Heart disease in her mother; Heart failure in her father and mother; Hypertension in her mother and another family member.   ROS:   Please see the history of present illness.    ROS All other systems reviewed and are negative.   PHYSICAL EXAM:   VS:  BP 122/82   Pulse 99   Ht 5\' 2"  (1.575 m)   Wt 201 lb (91.2 kg)   SpO2 98%   BMI 36.76 kg/m    GEN: Well nourished, well developed, in no acute distress  HEENT: normal  Neck: no JVD, carotid bruits, or masses Cardiac: RRR; no murmurs, rubs, or gallops,no edema  Respiratory:  clear to auscultation bilaterally, normal work of breathing GI: soft, nontender, nondistended, + BS MS: no deformity or atrophy  Skin: warm and dry, no rash Neuro:  Alert and Oriented x 3, Strength and sensation are intact Psych: euthymic mood, full affect  Wt Readings from Last 3 Encounters:  09/24/19 201 lb (91.2 kg)  09/16/19 202 lb (91.6 kg)  04/23/19 220 lb 9.6 oz (100.1 kg)      Studies/Labs Reviewed:   EKG:  EKG is not  ordered today.    Recent Labs: 04/27/2019: TSH 1.790 09/16/2019: BUN 18; Creatinine, Ser 0.76; Hemoglobin 12.4; Platelets 309; Potassium 3.1; Sodium 135   Lipid Panel    Component Value Date/Time   CHOL 151 08/08/2012 0934   TRIG 101.0 08/08/2012 0934   HDL 45.70 08/08/2012 0934   CHOLHDL 3 08/08/2012 0934   VLDL 20.2 08/08/2012 0934   LDLCALC 85 08/08/2012 0934    Additional studies/ records that were reviewed today include:   Echo 06/2016  - Left ventricle: The cavity size was normal. Wall thickness was normal. Systolic function was normal. The estimated ejection fraction was in the range of 60% to 65%. Wall motion was normal; there were no regional wall  motion abnormalities. Left ventricular diastolic function parameters were normal.  Impressions:  - Normal study.  ETT 07/05/16  Blood pressure demonstrated a normal response to exercise.  There was no ST segment deviation noted during stress.  Normal exercise treadmill stress test. No ischemia. Poor exercise tolerance. Normal BP response to stress.   Monitor 05/2016 The heart monitor shows normal heart rhythm. No significant dysrhythmias were identified. Continue current treatment plan. Please fax a copy of this study result  to her PCP: PROVIDER NOT IN SYSTEM  Thanks!    ASSESSMENT & PLAN:    1.  Autonomic dysfunction/POTS -Symptoms similar when she was dealing with POTS previously.  She has not took any medication for past 2 years.  Her symptoms were improved in ER after fluids.  I have encouraged adding more fluids and salt in her diet.  Restart pindolol 2.5 mg twice daily.  Check TSH.  2.  Hypokalemia -Recheck lab  3.  S/p SVT ablation by Dr. Graciela Husbands remotely    Medication Adjustments/Labs and Tests Ordered: Current medicines are reviewed at length with the patient today.  Concerns regarding medicines are outlined above.  Medication changes, Labs and Tests ordered today are listed in the Patient Instructions below. Patient Instructions  Medication Instructions:  Your physician has recommended you make the following change in your medication:  1.  START Pindolol 5 mg taking 1/2 tablet twice a day  *If you need a refill on your cardiac medications before your next appointment, please call your pharmacy*   Lab Work: TODAY:  BMET & TSH  If you have labs (blood work) drawn today and your tests are completely normal, you will receive your results only by: Marland Kitchen MyChart Message (if you have MyChart) OR . A paper copy in the mail If you have any lab test that is abnormal or we need to change your treatment, we will call you to review the  results.   Testing/Procedures: None ordered   Follow-Up: At [facility], you and your health needs are our priority.  As part of our continuing mission to provide you with exceptional heart care, we have created designated Provider Care Teams.  These Care Teams include your primary Cardiologist (physician) and Advanced Practice Providers (APPs -  Physician Assistants and Nurse Practitioners) who all work together to provide you with the care you need, when you need it.  We recommend signing up for the patient portal called "MyChart".  Sign up information is provided on this After Visit Summary.  MyChart is used to connect with patients for Virtual Visits (Telemedicine).  Patients are able to view lab/test results, encounter notes, upcoming appointments, etc.  Non-urgent messages can be sent to your provider as well.   To learn more about what you can do with MyChart, go to [email or web address].    Your next appointment:   6 week(s)  The format for your next appointment:   Virtual Visit   Provider:   Dietrich Pates, MD   Other Instructions      Signed, Manson Passey, [place]  09/24/2019 2:44 PM    [facility] [address] [place], [place], [place]  [address] Phone: [phone]; Fax: [phone]

## 2019-09-24 ENCOUNTER — Ambulatory Visit (INDEPENDENT_AMBULATORY_CARE_PROVIDER_SITE_OTHER): Payer: Self-pay | Admitting: Physician Assistant

## 2019-09-24 ENCOUNTER — Encounter: Payer: Self-pay | Admitting: Physician Assistant

## 2019-09-24 ENCOUNTER — Other Ambulatory Visit: Payer: Self-pay

## 2019-09-24 VITALS — BP 122/82 | HR 99 | Ht 62.0 in | Wt 201.0 lb

## 2019-09-24 DIAGNOSIS — G901 Familial dysautonomia [Riley-Day]: Secondary | ICD-10-CM

## 2019-09-24 DIAGNOSIS — I498 Other specified cardiac arrhythmias: Secondary | ICD-10-CM

## 2019-09-24 DIAGNOSIS — G90A Postural orthostatic tachycardia syndrome (POTS): Secondary | ICD-10-CM

## 2019-09-24 DIAGNOSIS — R002 Palpitations: Secondary | ICD-10-CM

## 2019-09-24 DIAGNOSIS — E876 Hypokalemia: Secondary | ICD-10-CM

## 2019-09-24 MED ORDER — PINDOLOL 5 MG PO TABS: 2.5000 mg | ORAL_TABLET | Freq: Two times a day (BID) | ORAL | 1 refills | Status: DC

## 2019-09-24 NOTE — Patient Instructions (Signed)
Medication Instructions:  Your physician has recommended you make the following change in your medication:  1.  START Pindolol 5 mg taking 1/2 tablet twice a day  *If you need a refill on your cardiac medications before your next appointment, please call your pharmacy*   Lab Work: TODAY:  BMET & TSH  If you have labs (blood work) drawn today and your tests are completely normal, you will receive your results only by: Marland Kitchen MyChart Message (if you have MyChart) OR . A paper copy in the mail If you have any lab test that is abnormal or we need to change your treatment, we will call you to review the results.   Testing/Procedures: None ordered   Follow-Up: At [facility], you and your health needs are our priority.  As part of our continuing mission to provide you with exceptional heart care, we have created designated Provider Care Teams.  These Care Teams include your primary Cardiologist (physician) and Advanced Practice Providers (APPs -  Physician Assistants and Nurse Practitioners) who all work together to provide you with the care you need, when you need it.  We recommend signing up for the patient portal called "MyChart".  Sign up information is provided on this After Visit Summary.  MyChart is used to connect with patients for Virtual Visits (Telemedicine).  Patients are able to view lab/test results, encounter notes, upcoming appointments, etc.  Non-urgent messages can be sent to your provider as well.   To learn more about what you can do with MyChart, go to [email or web address].    Your next appointment:   6 week(s)  The format for your next appointment:   Virtual Visit   Provider:   Dietrich Pates, MD   Other Instructions

## 2019-09-25 LAB — BASIC METABOLIC PANEL
BUN/Creatinine Ratio: 17 (ref 9–23)
BUN: 13 mg/dL (ref 6–20)
CO2: 25 mmol/L (ref 20–29)
Calcium: 9.2 mg/dL (ref 8.7–10.2)
Chloride: 101 mmol/L (ref 96–106)
Creatinine, Ser: 0.76 mg/dL (ref 0.57–1.00)
GFR calc Af Amer: 118 mL/min/{1.73_m2} (ref >59)
GFR calc non Af Amer: 102 mL/min/{1.73_m2} (ref >59)
Glucose: 91 mg/dL (ref 65–99)
Potassium: 4.2 mmol/L (ref 3.5–5.2)
Sodium: 139 mmol/L (ref 134–144)

## 2019-09-25 LAB — TSH: TSH: 1.43 u[IU]/mL (ref 0.450–4.500)

## 2019-10-22 ENCOUNTER — Other Ambulatory Visit: Payer: Self-pay

## 2019-10-22 ENCOUNTER — Telehealth (INDEPENDENT_AMBULATORY_CARE_PROVIDER_SITE_OTHER): Payer: Self-pay | Admitting: Internal Medicine

## 2019-10-22 DIAGNOSIS — R002 Palpitations: Secondary | ICD-10-CM

## 2019-10-23 NOTE — Progress Notes (Signed)
Appt cancelled  Unable to reach pt

## 2019-10-26 NOTE — Progress Notes (Deleted)
Cardiology Office Note:    Date:  10/26/2019   ID:  Carman Ching, DOB 1986/03/16, MRN [id number]  PCP:  System, Provider Not In  Cardiologist:  No primary care provider on file. *** Electrophysiologist:  None   Referring MD: No ref. provider found   Chief Complaint:  No chief complaint on file.    Patient Profile:    [name] [name] is a 34 y.o. female with:   Autonomic dysfunction/POTS  SVT   S/p RF ablation in 2004 (Dr. Caryl Comes)  Anxiety/Depression  Prior CV studies: Echocardiogram 07/05/16 EF 60-65, no RWMA  GXT 07/05/16 No ischemia   Event monitor 05/25/16 Normal sinus rhythm   Echo 6/04 EF 55-65, normal wall motion  History of Present Illness:    Ms. Litzenberger was last seen in clinic in 09/2019 by Leanor Kail, PA-C.  She had been to the ED with palpitations and a low K+.  She had previously been on Midodrine and Pindolol.  But she had been out of these.  Her Pindolol was resumed.  She was to have a follow up virtual visit last week, but e patient could not be reached.  The DICTATELATER SmartLink is not supported in this context. ***   Past Medical History:  Diagnosis Date  . Anxiety   . Depression   . DUB (dysfunctional uterine bleeding)   . Dysautonomia ([place])   . History of echocardiogram    Echo 2/18: EF 60-65, no RWMA  . History of exercise stress test    a. ETT 1/18: Ex 5', no ischemic changes, no arrhythmias.  . Palpitations    Event Monitor 12/17: NSR, no dysrhythmias  . Panic attacks   . POTS (postural orthostatic tachycardia syndrome)   . Schizoaffective disorder, bipolar type ([place])    With PTSD  . SVT (supraventricular tachycardia) ([place]) 2004   S/P slow path modification    Current Medications: No outpatient medications have been marked as taking for the 10/27/19 encounter (Appointment) with Richardson Dopp T, PA-C.     Allergies:   Shellfish allergy, Vancomycin, Soy isoflavones, and Soy allergy   Social History   Tobacco Use  .  Smoking status: Never Smoker  . Smokeless tobacco: Never Used  Substance Use Topics  . Alcohol use: No  . Drug use: No     Family Hx: The patient's family history includes Depression in an other family member; Diabetes in her mother and another family member; Heart disease in her mother; Heart failure in her father and mother; Hypertension in her mother and another family member.  ROS   EKGs/Labs/Other Test Reviewed:    EKG:  EKG is *** ordered today.  The ekg ordered today demonstrates ***  Recent Labs: 09/16/2019: Hemoglobin 12.4; Platelets 309 09/24/2019: BUN 13; Creatinine, Ser 0.76; Potassium 4.2; Sodium 139; TSH 1.430   Recent Lipid Panel Lab Results  Component Value Date/Time   CHOL 151 08/08/2012 09:34 AM   TRIG 101.0 08/08/2012 09:34 AM   HDL 45.70 08/08/2012 09:34 AM   CHOLHDL 3 08/08/2012 09:34 AM   LDLCALC 85 08/08/2012 09:34 AM    Physical Exam:    VS:  There were no vitals taken for this visit.    Wt Readings from Last 3 Encounters:  09/24/19 201 lb (91.2 kg)  09/16/19 202 lb (91.6 kg)  04/23/19 220 lb 9.6 oz (100.1 kg)     Physical Exam ***  ASSESSMENT & PLAN:    *** 1. Palpitations - Her palpitations are likely related to her  autonomic dysfunction and POTS. I suspect that this has been exacerbated by starting her new job which involves a lot of physical activity. She has not been used to this type of activity. 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 Her orthostatic vital signs today do not show a significant drop in her blood pressure from lying to standing. She has a minimal increase in her heart rate. However, it sounds as though she may be volume deplete. I have asked her to increase her volume intake. I have also asked her to add some salt to her diet and to raise the head of her bed. I also gave her a prescription to get compression stockings.  She does have some symptoms of palpitations with a rapid heartbeat while at rest. She does  have a history of SVT in the past and is status post prior ablation.              -  I will arrange a 30 day event monitor.  2. Chest pain - Her chest pain is somewhat atypical. I do think that this is all related to her rapid heartbeats. However, she seems to note chest discomfort with any type of activity. She does not have significant risk factors for CAD. However, her symptoms are always exertional.             -  I will arrange an ETT-echocardiogram.  3. POTS - As noted, I have reiterated to her today that she needs to push her fluids, be liberal with salt, wear compression stockings and stand up slowly after lying or sitting for a prolonged period of time.  4. SVT - s/p RFCA in 2004.  She has had some rapid palpitations recently with heart rates in the 150s while seated. I have arranged for a 30 day event monitor.  5. Bipolar d/o - She was recently placed on 2 separate medications for her diagnosis of bipolar schizoaffective disorder. Prazosin is an alpha-blocker and can contribute to orthostatic hypotension. I have recommended that she not use the Prazosin. Her antipsychotic drug does list orthostatic hypotension as a potential side effect. However, this is not likely to be as significant as what she would experience with the Prazosin. I have asked her to discuss her hx of POTS with her Psychiatrist to see if Saphris is still a good option for her.    Dispo:  No follow-ups on file.   Medication Adjustments/Labs and Tests Ordered: Current medicines are reviewed at length with the patient today.  Concerns regarding medicines are outlined above.  Tests Ordered: No orders of the defined types were placed in this encounter.  Medication Changes: No orders of the defined types were placed in this encounter.   Signed, Tereso Newcomer, PA-C  10/26/2019 3:01 PM    [facility] [address] [place], [place], [place]  [address] Phone: [phone]; Fax: [phone]

## 2019-10-27 ENCOUNTER — Ambulatory Visit: Payer: Self-pay | Admitting: Physician Assistant

## 2019-10-30 IMAGING — CT CT ABD-PELV W/ CM
2 of 4 series · 16 of 46 positions shown, 18 images · IV contrast (Isovue)
Comparison: 04/02/2015

CLINICAL DATA: Right upper quadrant abdominal pain with
intermittent nausea and vomiting since [REDACTED]. Patient also reports
fever diarrhea since [REDACTED].

EXAM:
CT ABDOMEN AND PELVIS WITH CONTRAST
TECHNIQUE: Multidetector CT imaging of the abdomen and pelvis was performed
using the standard protocol following bolus administration of
intravenous contrast.
CONTRAST:  100mL J7HI8M-HII IOPAMIDOL (J7HI8M-HII) INJECTION 61%

[Series 2: axial st · axial · 0.74mm/px · z∈[+996,+1376]mm · 13 of 84 slices shown, 15 images]
[im 4/84  soft-tissue]
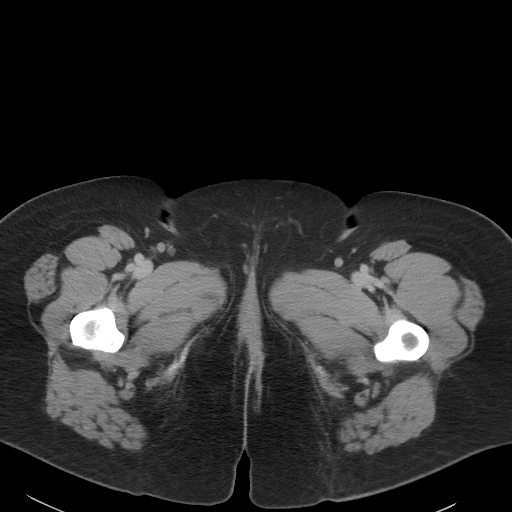
[im 4/84  bone]
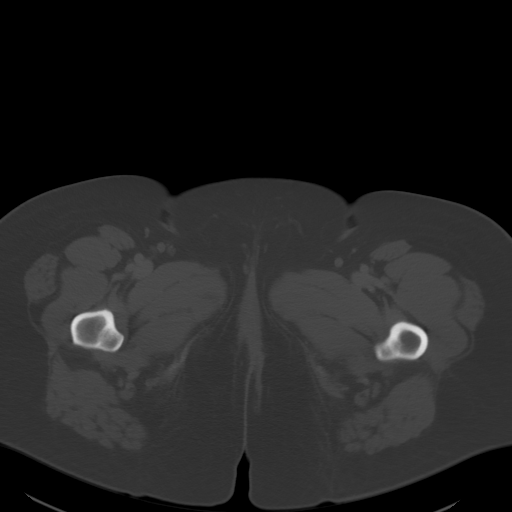
[im 11/84  soft-tissue]
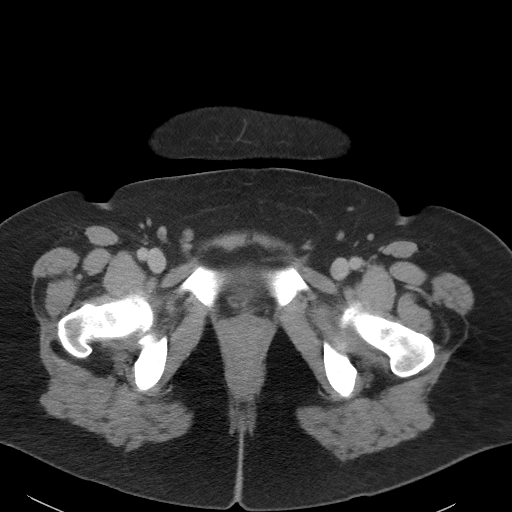
[im 18/84  soft-tissue]
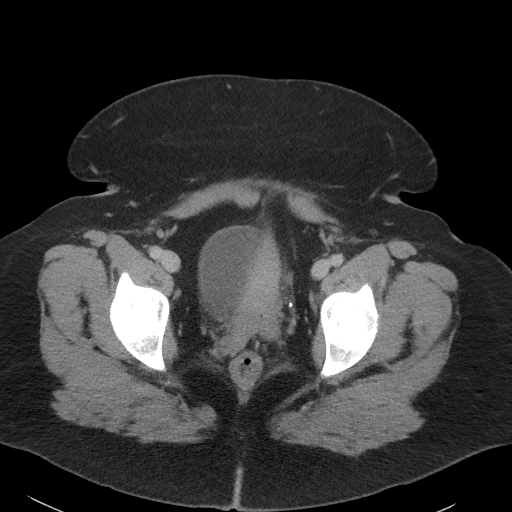
[im 25/84  soft-tissue]
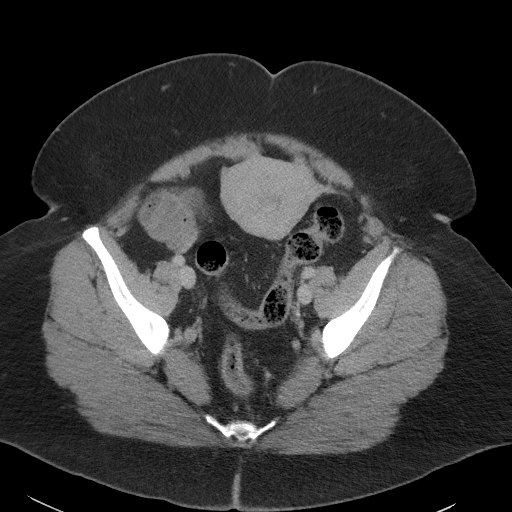
[im 28/84  soft-tissue]
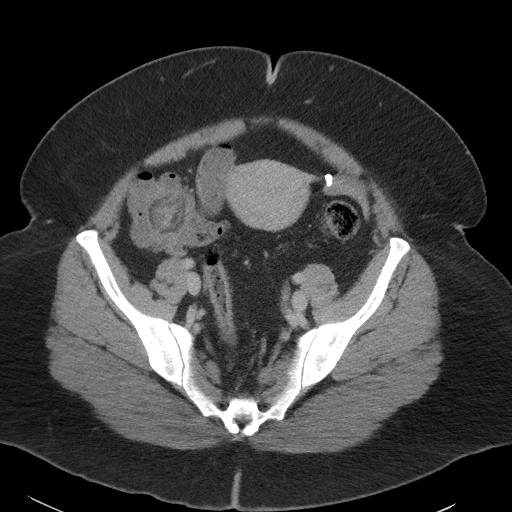
[im 35/84  soft-tissue]
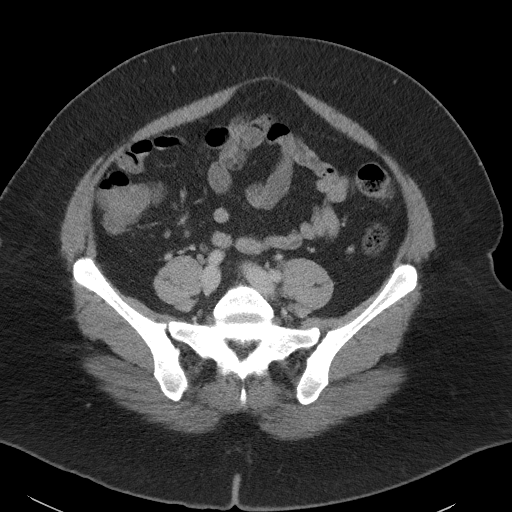
[im 42/84  soft-tissue]
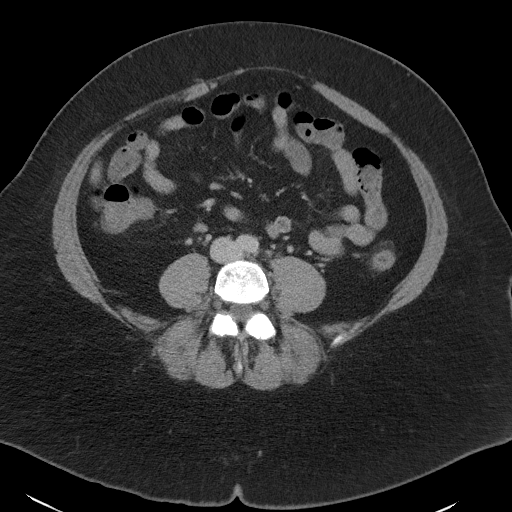
[im 49/84  soft-tissue]
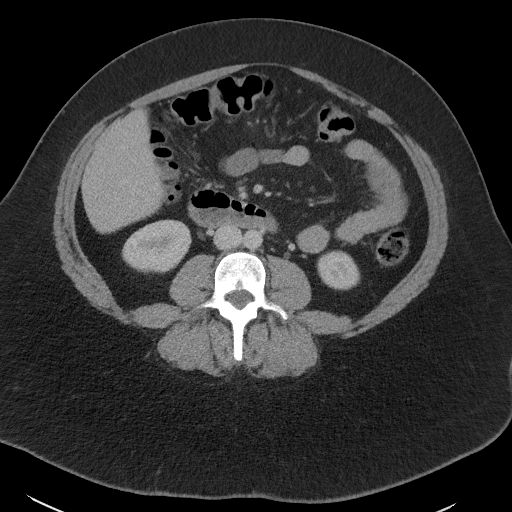
[im 56/84  soft-tissue]
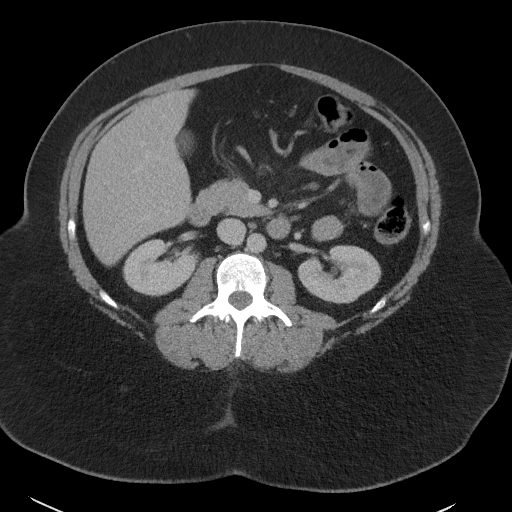
[im 56/84  bone]
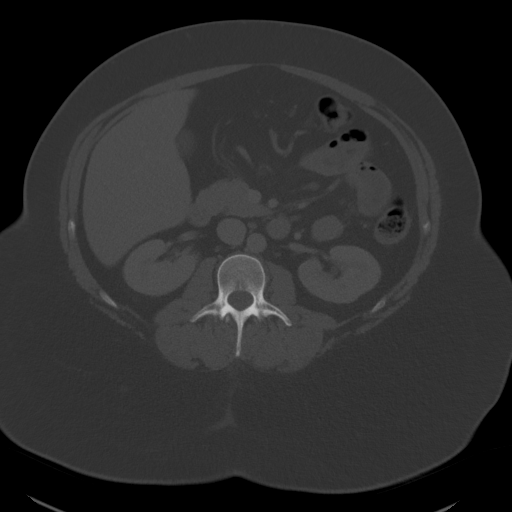
[im 59/84  soft-tissue]
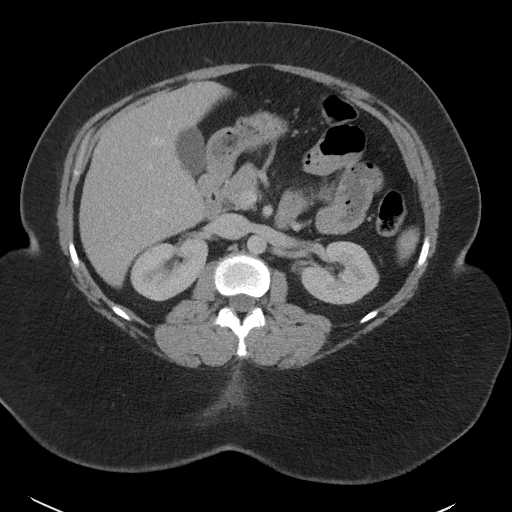
[im 66/84  soft-tissue]
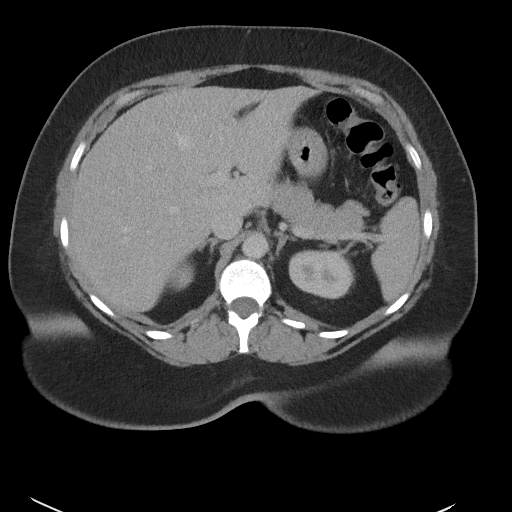
[im 73/84  soft-tissue]
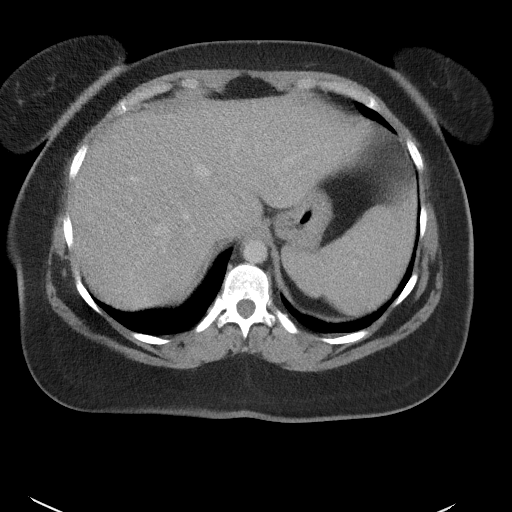
[im 80/84  soft-tissue]
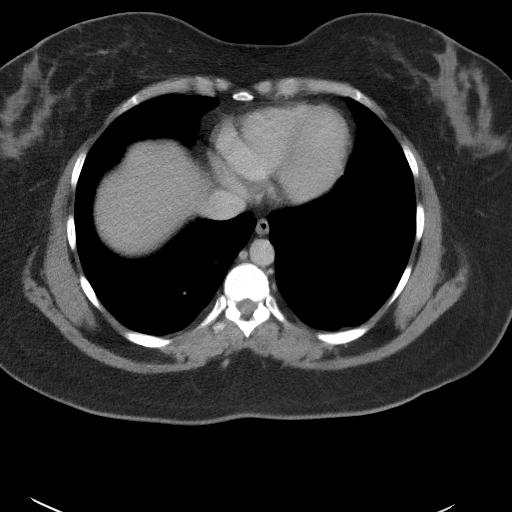

[Series 5: coronal st · coronal · 0.73mm/px · 3 of 117 slices shown]
[im 39/117  soft-tissue]
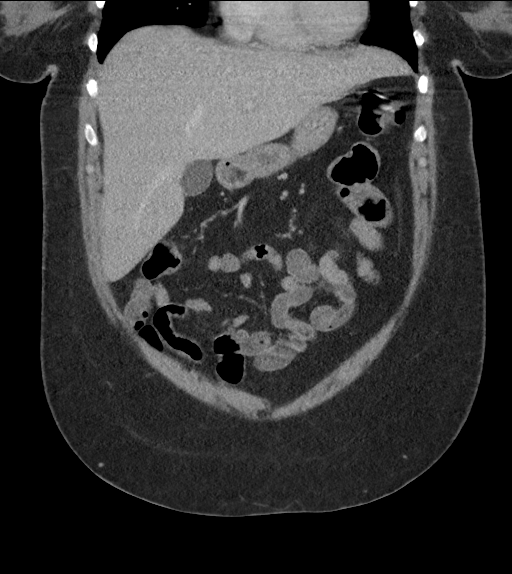
[im 52/117  soft-tissue]
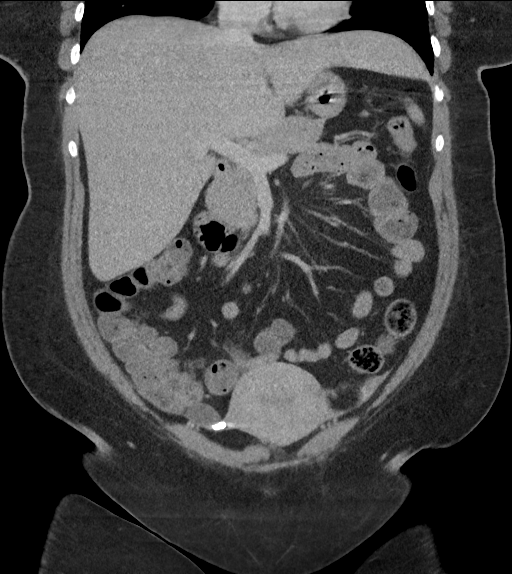
[im 65/117  soft-tissue]
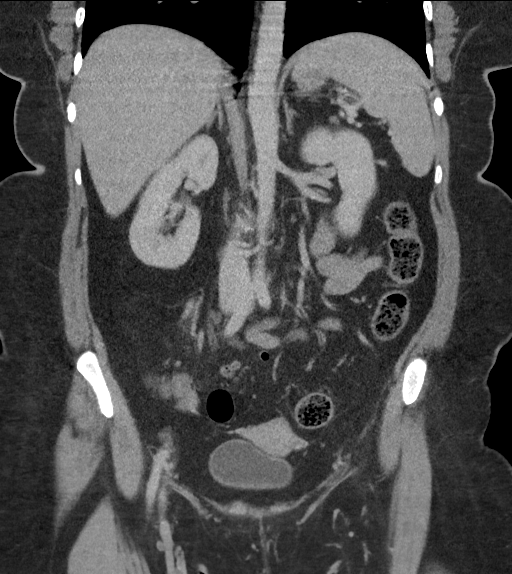

[16 of 46 positions shown; findings below may reference images not displayed]

FINDINGS: Lower chest: Normal size heart. No pleural effusion. Minimal
subpleural atelectasis at the left lung base.

Hepatobiliary: Homogeneous appearance of the liver without
space-occupying mass. Nondistended gallbladder. No evidence of
cholecystitis or cholelithiasis. No biliary dilatation.

Pancreas: Normal

Spleen: Normal

Adrenals/Urinary Tract: Normal bilateral adrenal glands and kidneys.
No obstructive uropathy. Unremarkable urinary bladder without focal
mural thickening or calculus.

Stomach/Bowel: Decompressed stomach. Normal small bowel rotation.
Mild jejunal distention in the left upper quadrant without
mechanical bowel obstruction. A nonobstructed non distended large
bowel. There suggestion of mild transmural thickening of the sigmoid
colon some which is likely due to underdistention. Mild colitis is
not excluded.

Vascular/Lymphatic: No significant vascular findings are present. No
enlarged abdominal or pelvic lymph nodes.

Reproductive: Bilateral tubal ligations. Physiologic dominant
follicle in the right ovary measuring 2 cm. Uterus is unremarkable.

Other: No free air nor free fluid.

Musculoskeletal: No acute or significant osseous findings.
IMPRESSION: Mild enterocolitis with more focal dilatation of proximal jejunal
loops and suggestion of mild transmural thickening of the sigmoid.
No mechanical bowel obstruction.

## 2019-12-10 NOTE — Progress Notes (Deleted)
Cardiology Office Note:    Date:  12/10/2019   ID:  [name] [name], DOB 01/07/1986, MRN [id number]  PCP:  System, Provider Not In  Cardiologist:  No primary care provider on file. *** Electrophysiologist:  None   Referring MD: No ref. provider found   Chief Complaint:  No chief complaint on file.    Patient Profile:    [name] [name] is a 34 y.o. female with:   POTS  SVT  S/p RFCA in 2004 (Dr. Graciela Husbands)  Bipolar schizoaffective d/o  Prior CV studies: Echocardiogram 07/05/16 EF 60-65, no RWMA  ETT 07/05/16 Normal ETT, no ischemia   Event monitor 05/25/16 Normal sinus rhythm, no arrhythmias   Echo 6/04 EF 55-65, normal wall motion  History of Present Illness:    Ms. Kearl was previously on midodrine and pindolol.  She was seen in clinic in 09/2019 by Manson Passey, PA-C for follow up after going to the ED with palpitations.  She was started back on Pindolol.  she returns for follow up.  The DICTATELATER SmartLink is not supported in this context. ***   Past Medical History:  Diagnosis Date  . Anxiety   . Depression   . DUB (dysfunctional uterine bleeding)   . Dysautonomia ([facility])   . History of echocardiogram    Echo 2/18: EF 60-65, no RWMA  . History of exercise stress test    a. ETT 1/18: Ex 5', no ischemic changes, no arrhythmias.  . Palpitations    Event Monitor 12/17: NSR, no dysrhythmias  . Panic attacks   . POTS (postural orthostatic tachycardia syndrome)   . Schizoaffective disorder, bipolar type ([facility])    With PTSD  . SVT (supraventricular tachycardia) ([facility]) 2004   S/P slow path modification    Current Medications: No outpatient medications have been marked as taking for the 12/11/19 encounter (Appointment) with Tereso Newcomer T, PA-C.     Allergies:   Shellfish allergy, Vancomycin, Soy isoflavones, and Soy allergy   Social History   Tobacco Use  . Smoking status: Never Smoker  . Smokeless tobacco: Never Used  Vaping Use  . Vaping Use:  Never used  Substance Use Topics  . Alcohol use: No  . Drug use: No     Family Hx: The patient's family history includes Depression in an other family member; Diabetes in her mother and another family member; Heart disease in her mother; Heart failure in her father and mother; Hypertension in her mother and another family member.  ROS   EKGs/Labs/Other Test Reviewed:    EKG:  EKG is *** ordered today.  The ekg ordered today demonstrates ***  Recent Labs: 09/16/2019: Hemoglobin 12.4; Platelets 309 09/24/2019: BUN 13; Creatinine, Ser 0.76; Potassium 4.2; Sodium 139; TSH 1.430   Recent Lipid Panel Lab Results  Component Value Date/Time   CHOL 151 08/08/2012 09:34 AM   TRIG 101.0 08/08/2012 09:34 AM   HDL 45.70 08/08/2012 09:34 AM   CHOLHDL 3 08/08/2012 09:34 AM   LDLCALC 85 08/08/2012 09:34 AM    Physical Exam:    VS:  There were no vitals taken for this visit.    Wt Readings from Last 3 Encounters:  09/24/19 201 lb (91.2 kg)  09/16/19 202 lb (91.6 kg)  04/23/19 220 lb 9.6 oz (100.1 kg)     Physical Exam ***  ASSESSMENT & PLAN:    ***  Dispo:  No follow-ups on file.   Medication Adjustments/Labs and Tests Ordered: Current medicines are reviewed at length with the  patient today.  Concerns regarding medicines are outlined above.  Tests Ordered: No orders of the defined types were placed in this encounter.  Medication Changes: No orders of the defined types were placed in this encounter.   Signed, Tereso Newcomer, PA-C  12/10/2019 9:51 PM    [facility] [address] [place], [place], [place]  [address] Phone: [phone]; Fax: [phone]

## 2019-12-11 ENCOUNTER — Ambulatory Visit: Payer: Self-pay | Admitting: Physician Assistant

## 2021-05-10 ENCOUNTER — Encounter (HOSPITAL_COMMUNITY): Payer: Self-pay | Admitting: *Deleted

## 2021-05-10 ENCOUNTER — Emergency Department (HOSPITAL_COMMUNITY)
Admission: EM | Admit: 2021-05-10 | Discharge: 2021-05-10 | Disposition: A | Discharge status: Home / Self Care | Payer: Medicaid Other | Attending: Emergency Medicine | Admitting: Emergency Medicine

## 2021-05-10 ENCOUNTER — Emergency Department (HOSPITAL_COMMUNITY): Payer: Medicaid Other

## 2021-05-10 ENCOUNTER — Telehealth: Payer: Self-pay | Admitting: Internal Medicine

## 2021-05-10 ENCOUNTER — Other Ambulatory Visit: Payer: Self-pay

## 2021-05-10 DIAGNOSIS — R42 Dizziness and giddiness: Secondary | ICD-10-CM | POA: Insufficient documentation

## 2021-05-10 DIAGNOSIS — R002 Palpitations: Secondary | ICD-10-CM | POA: Insufficient documentation

## 2021-05-10 DIAGNOSIS — R0602 Shortness of breath: Secondary | ICD-10-CM | POA: Insufficient documentation

## 2021-05-10 DIAGNOSIS — R079 Chest pain, unspecified: Secondary | ICD-10-CM | POA: Insufficient documentation

## 2021-05-10 LAB — BASIC METABOLIC PANEL
Anion gap: 9 (ref 5–15)
BUN: 17 mg/dL (ref 6–20)
CO2: 25 mmol/L (ref 22–32)
Calcium: 8.6 mg/dL — ABNORMAL LOW (ref 8.9–10.3)
Chloride: 104 mmol/L (ref 98–111)
Creatinine, Ser: 0.67 mg/dL (ref 0.44–1.00)
GFR, Estimated: >60 mL/min (ref >60)
Glucose, Bld: 85 mg/dL (ref 70–99)
Potassium: 3.5 mmol/L (ref 3.5–5.1)
Sodium: 138 mmol/L (ref 135–145)

## 2021-05-10 LAB — CBC
HCT: 40.8 % (ref 36.0–46.0)
Hemoglobin: 12.9 g/dL (ref 12.0–15.0)
MCH: 29.8 pg (ref 26.0–34.0)
MCHC: 31.6 g/dL (ref 30.0–36.0)
MCV: 94.2 fL (ref 80.0–100.0)
Platelets: 338 10*3/uL (ref 150–400)
RBC: 4.33 MIL/uL (ref 3.87–5.11)
RDW: 13.7 % (ref 11.5–15.5)
WBC: 10 10*3/uL (ref 4.0–10.5)
nRBC: 0 % (ref 0.0–0.2)

## 2021-05-10 LAB — MAGNESIUM: Magnesium: 2.1 mg/dL (ref 1.7–2.4)

## 2021-05-10 LAB — TROPONIN I (HIGH SENSITIVITY): Troponin I (High Sensitivity): 2 ng/L

## 2021-05-10 NOTE — Telephone Encounter (Signed)
Spoke with pt who has a history of POTS/dysautonomia.  Pt complaining of dizziness, tachycardia with HR 150's earlier this morning as well as palpitations and chest discomfort that she reports is fairly constant.  She is well hydrated but salt deplete.  She states she was recently started on Midodrine and Metoprolol by her PCP but does not have bottles available to give dosage.  Pt advised due to constant chest discomfort and elevated HR she should present to ED for further evaluation.  Pt verbalizes understanding and agrees to current plan.

## 2021-05-10 NOTE — ED Provider Notes (Signed)
[facility] EMERGENCY DEPARTMENT Provider Note   CSN: [id number] Arrival date & time: 05/10/21  1843     History Chief Complaint  Patient presents with   Palpitations    [name] [name] is a 35 y.o. female.  She is here with a complaint of palpitations, feeling heart racing, chest pain, dizziness, shortness of breath that have been going on for the last 5 days.  She has had a history of SVT and ablation and also POTS. she tried to call her cardiologist but they could not fit her in for a few weeks.  No recent illness or change in medications.  Denies any nausea vomiting diarrhea.  No urinary symptoms.  The history is provided by the patient.  Palpitations Palpitations quality:  Fast Onset quality:  Sudden Duration:  5 days Timing:  Intermittent Progression:  Unchanged Chronicity:  Recurrent Relieved by:  None tried Worsened by:  Nothing Ineffective treatments:  None tried Associated symptoms: chest pain, dizziness and shortness of breath   Associated symptoms: no back pain, no cough, no lower extremity edema, no nausea and no vomiting       Past Medical History:  Diagnosis Date   Anxiety    Depression    DUB (dysfunctional uterine bleeding)    Dysautonomia ([place])    History of echocardiogram    Echo 2/18: EF 60-65, no RWMA   History of exercise stress test    a. ETT 1/18: Ex 5', no ischemic changes, no arrhythmias.   Palpitations    Event Monitor 12/17: NSR, no dysrhythmias   Panic attacks    POTS (postural orthostatic tachycardia syndrome)    Schizoaffective disorder, bipolar type ([facility])    With PTSD   SVT (supraventricular tachycardia) ([place]) 2004   S/P slow path modification    Patient Active Problem List   Diagnosis Date Noted   Breast discharge 04/23/2019   Nipple discharge 04/23/2019   Encounter for well woman exam with routine gynecological exam 04/23/2019   Menorrhagia with regular cycle 04/23/2019   Hair loss 04/23/2019   Weight gain 04/23/2019    Memory loss 04/23/2019   PMS (premenstrual syndrome) 04/23/2019   Screening examination for STD (sexually transmitted disease) 04/23/2019   Family planning 04/23/2019   Syncope 02/06/2017   Numbness 02/06/2017   Insomnia 02/06/2017   Schizoaffective disorder, bipolar type ([place])    Obesity (BMI 35.0-39.9 without comorbidity) 02/24/2014   Dysautonomia ([place]) 12/21/2010   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   UNSPECIFIED HYPOTENSION 02/23/2009   Tachycardia 02/23/2009    Past Surgical History:  Procedure Laterality Date   CESAREAN SECTION     TUBAL LIGATION     VENTRICULAR ABLATION SURGERY     For supraventricular tachycardia     OB History     Gravida  3   Para  3   Term  3   Preterm      AB      Living         SAB      IAB      Ectopic      Multiple      Live Births              Family History  Problem Relation Age of Onset   Heart failure Mother    Heart disease Mother    Hypertension Mother    Diabetes Mother    Heart failure Father    Hypertension Other    Diabetes Other  Depression Other     Social History   Tobacco Use   Smoking status: Never   Smokeless tobacco: Never  Vaping Use   Vaping Use: Never used  Substance Use Topics   Alcohol use: No   Drug use: No    Home Medications Prior to Admission medications   Medication Sig Start Date End Date Taking? Authorizing Provider  diphenhydrAMINE (BENADRYL) 25 mg capsule Take 25 mg by mouth every 6 (six) hours as needed for itching.    [provider]  Multiple Vitamins-Minerals (HAIR SKIN & NAILS ADVANCED) TABS Take 1 tablet by mouth daily.    [provider]  naproxen sodium (ALEVE) 220 MG tablet Take 440 mg by mouth daily as needed (for pain).    [provider]  pindolol (VISKEN) 5 MG tablet Take 0.5 tablets (2.5 mg total) by mouth 2 (two) times daily. 09/24/19   Leanor Kail, PA    Allergies    Shellfish allergy,  Vancomycin, Isoflavones (soy), and Soy allergy  Review of Systems   Review of Systems  Constitutional:  Negative for fever.  HENT:  Negative for sore throat.   Eyes:  Negative for visual disturbance.  Respiratory:  Positive for shortness of breath. Negative for cough.   Cardiovascular:  Positive for chest pain and palpitations.  Gastrointestinal:  Negative for abdominal pain, nausea and vomiting.  Genitourinary:  Negative for dysuria and urgency.  Musculoskeletal:  Negative for back pain.  Skin:  Negative for rash.  Neurological:  Positive for dizziness.   Physical Exam Updated Vital Signs BP (!) 136/93 (BP Location: Right Arm)   Pulse 85   Temp 98 F (36.7 C) (Oral)   Resp 15   Ht 5\' 1"  (1.549 m)   Wt 86.2 kg   LMP  (LMP Unknown)   SpO2 99%   BMI 35.90 kg/m   Physical Exam Vitals and nursing note reviewed.  Constitutional:      General: She is not in acute distress.    Appearance: Normal appearance. She is well-developed.  HENT:     Head: Normocephalic and atraumatic.  Eyes:     Conjunctiva/sclera: Conjunctivae normal.  Cardiovascular:     Rate and Rhythm: Normal rate and regular rhythm.     Pulses: Normal pulses.     Heart sounds: No murmur heard. Pulmonary:     Effort: Pulmonary effort is normal. No respiratory distress.     Breath sounds: Normal breath sounds.  Abdominal:     Palpations: Abdomen is soft.     Tenderness: There is no abdominal tenderness. There is no guarding or rebound.  Musculoskeletal:        General: No swelling. Normal range of motion.     Cervical back: Neck supple.     Right lower leg: No edema.     Left lower leg: No edema.  Skin:    General: Skin is warm and dry.     Capillary Refill: Capillary refill takes less than 2 seconds.  Neurological:     General: No focal deficit present.     Mental Status: She is alert.  Psychiatric:        Mood and Affect: Mood normal.    ED Results / Procedures / Treatments   Labs (all labs  ordered are listed, but only abnormal results are displayed) Labs Reviewed  BASIC METABOLIC PANEL - Abnormal; Notable for the following components:      Result Value   Calcium 8.6 (*)  All other components within normal limits  CBC  MAGNESIUM  TROPONIN I (HIGH SENSITIVITY)    EKG EKG Interpretation  Date/Time:  Wednesday May 10 2021 19:31:22 EST Ventricular Rate:  86 PR Interval:  116 QRS Duration: 90 QT Interval:  350 QTC Calculation: 418 R Axis:   51 Text Interpretation: Normal sinus rhythm Normal ECG No significant change since prior 4/21 Confirmed by Meridee Score [phone]) on 05/10/2021 7:50:16 PM  Radiology DG Chest 2 View  Result Date: 05/10/2021 CLINICAL DATA:  Palpitations EXAM: CHEST - 2 VIEW COMPARISON:  None. FINDINGS: The heart size and mediastinal contours are within normal limits. Both lungs are clear. The visualized skeletal structures are unremarkable. IMPRESSION: No active cardiopulmonary disease. Electronically Signed   By: Deatra Robinson M.D.   On: 05/10/2021 20:23    Procedures Procedures   Medications Ordered in ED Medications - No data to display  ED Course  I have reviewed the triage vital signs and the nursing notes.  Pertinent labs & imaging results that were available during my care of the patient were reviewed by me and considered in my medical decision making (see chart for details).  Clinical Course as of 05/11/21 1032  Wed May 10, 2021  2027 Chest x-ray ordered and interpreted by me as no acute pulmonary disease. [MB]  2152 Reviewed results of work-up with patient.  She has had no abnormal rhythms here.  Recommended close follow-up with her cardiology.  Return instructions discussed [MB]    Clinical Course User Index [MB] Terrilee Files, MD   MDM Rules/Calculators/A&P                          This patient complains of palpitations tachycardia chest pain shortness of breath; this involves an extensive number of  treatment Options and is a complaint that carries with it a high risk of complications and Morbidity. The differential includes arrhythmia, metabolic derangement, ACS dehydration  I ordered, reviewed and interpreted labs, which included CBC with normal white count normal hemoglobin, chemistries normal, troponins flat I ordered imaging studies which included chest x-ray and I independently    visualized and interpreted imaging which showed no acute findings Additional history obtained from patient significant other Previous records obtained and reviewed in epic including prior cardiology notes  After the interventions stated above, I reevaluated the patient and found patient to be asymptomatic.  He has had no arrhythmias here.  Labs unremarkable.  Recommended close follow-up with her cardiologist.  Return instructions discussed   Final Clinical Impression(s) / ED Diagnoses Final diagnoses:  Palpitations  Nonspecific chest pain    Rx / DC Orders ED Discharge Orders     None        Terrilee Files, MD 05/11/21 1034

## 2021-05-10 NOTE — Discharge Instructions (Addendum)
You were seen in the emergency department for palpitations chest pain shortness of breath.  You had lab work chest x-ray and EKG that did not show any abnormalities.  Please continue your regular medications and call your cardiologist for close follow-up.  Return to the emergency department if any worsening or concerning symptoms.

## 2021-05-10 NOTE — ED Triage Notes (Signed)
Pt c/o heart palpitations x 5 days and makes her feel like she is going to pass out;

## 2021-05-10 NOTE — Telephone Encounter (Signed)
   STAT if patient feels like he/she is going to faint   Are you dizzy now? Yes   Do you feel faint or have you passed out? Feel faint   Do you have any other symptoms? Irregular HR, skipping beat, lightheaded severe headache and fatigue, blurred vision  Have you checked your HR and BP (record if available)? No    Pt c/o of Chest Pain: STAT if CP now or developed within 24 hours  1. Are you having CP right now? Yes   2. Are you experiencing any other symptoms (ex. SOB, nausea, vomiting, sweating)?   3. How long have you been experiencing CP?   4. Is your CP continuous or coming and going? continuous   5. Have you taken Nitroglycerin? No    ?

## 2021-11-22 ENCOUNTER — Emergency Department (HOSPITAL_BASED_OUTPATIENT_CLINIC_OR_DEPARTMENT_OTHER)
Admission: EM | Admit: 2021-11-22 | Discharge: 2021-11-22 | Disposition: A | Discharge status: Home / Self Care | Payer: BC Managed Care – PPO | Attending: Emergency Medicine | Admitting: Emergency Medicine

## 2021-11-22 ENCOUNTER — Emergency Department (HOSPITAL_BASED_OUTPATIENT_CLINIC_OR_DEPARTMENT_OTHER): Payer: BC Managed Care – PPO | Admitting: Radiology

## 2021-11-22 ENCOUNTER — Encounter (HOSPITAL_BASED_OUTPATIENT_CLINIC_OR_DEPARTMENT_OTHER): Payer: Self-pay

## 2021-11-22 ENCOUNTER — Telehealth: Payer: Self-pay | Admitting: Internal Medicine

## 2021-11-22 ENCOUNTER — Other Ambulatory Visit: Payer: Self-pay

## 2021-11-22 DIAGNOSIS — G90A Postural orthostatic tachycardia syndrome (POTS): Secondary | ICD-10-CM | POA: Diagnosis not present

## 2021-11-22 DIAGNOSIS — R079 Chest pain, unspecified: Secondary | ICD-10-CM | POA: Diagnosis present

## 2021-11-22 DIAGNOSIS — R002 Palpitations: Secondary | ICD-10-CM

## 2021-11-22 LAB — CBC
HCT: 34.6 % — ABNORMAL LOW (ref 36.0–46.0)
Hemoglobin: 11.2 g/dL — ABNORMAL LOW (ref 12.0–15.0)
MCH: 28.5 pg (ref 26.0–34.0)
MCHC: 32.4 g/dL (ref 30.0–36.0)
MCV: 88 fL (ref 80.0–100.0)
Platelets: 289 10*3/uL (ref 150–400)
RBC: 3.93 MIL/uL (ref 3.87–5.11)
RDW: 14.7 % (ref 11.5–15.5)
WBC: 8.7 10*3/uL (ref 4.0–10.5)
nRBC: 0 % (ref 0.0–0.2)

## 2021-11-22 LAB — BASIC METABOLIC PANEL
Anion gap: 15 (ref 5–15)
BUN: 12 mg/dL (ref 6–20)
CO2: 22 mmol/L (ref 22–32)
Calcium: 8.9 mg/dL (ref 8.9–10.3)
Chloride: 105 mmol/L (ref 98–111)
Creatinine, Ser: 0.88 mg/dL (ref 0.44–1.00)
GFR, Estimated: >60 mL/min (ref >60)
Glucose, Bld: 123 mg/dL — ABNORMAL HIGH (ref 70–99)
Potassium: 3.6 mmol/L (ref 3.5–5.1)
Sodium: 142 mmol/L (ref 135–145)

## 2021-11-22 LAB — TROPONIN I (HIGH SENSITIVITY): Troponin I (High Sensitivity): <2 ng/L (ref <18)

## 2021-11-22 NOTE — Telephone Encounter (Signed)
Pt called to report that she has not been feeling well for a few days and today she feels very bad....   Vision blurry; nauseous, severe headache, feels like passing out  She says she thinks she may vomit and has a severe headache... she has had blurred vision. She does not know what her BP is but a few weeks ago it was spiking to 200 systolic.   She has not had a recent illness and has been hydrating well.   I advised her that with her symptoms and no available appts today and she should be assessed asap... she will have someone drive her to either the [facility] or to the [facility] ED for immediate care.   Pt has 12/06/21 upcoming appt.

## 2021-11-22 NOTE — Telephone Encounter (Signed)
Patient c/o Palpitations:  High priority if patient c/o lightheadedness, shortness of breath, or chest pain  How long have you had palpitations/irregular HR/ Afib? Are you having the symptoms now? Yes   Are you currently experiencing lightheadedness, SOB or CP? Lightheadedness, SOB, off and on CP  Do you have a history of afib (atrial fibrillation) or irregular heart rhythm? No   Have you checked your BP or HR? (document readings if available): 149/96; 99  Are you experiencing any other symptoms? Vision blurry; nauseous, severe headache, feels like passing out; has SVT and POTS

## 2021-11-22 NOTE — ED Provider Notes (Signed)
[facility] EMERGENCY DEPT Provider Note   CSN: [id number] Arrival date & time: 11/22/21  1423     History  Chief Complaint  Patient presents with   Chest Pain    [name] [name] is a 36 y.o. female.  Patient reports she was feeling dizzy and feeling like her heart was racing prior to coming to the emergency department.  Patient reports all of her symptoms have currently resolved.  Patient has a history of pots syndrome and has frequent episodes of feeling dizzy and feeling like her heart is racing.  Patient reports she has also been experiencing some fatigue and headaches recently.  Patient has been seen by Dr. Harrington Challenger cardiology.  Patient has not seen her recently  The history is provided by the patient. No language interpreter was used.  Chest Pain Pain location:  Epigastric Pain quality: aching   Pain radiates to:  Does not radiate Pain severity:  No pain Progression:  Resolved Chronicity:  New Relieved by:  Nothing      Home Medications Prior to Admission medications   Medication Sig Start Date End Date Taking? Authorizing Provider  diphenhydrAMINE (BENADRYL) 25 mg capsule Take 25 mg by mouth every 6 (six) hours as needed for itching.    [provider]  escitalopram (LEXAPRO) 10 MG tablet Take by mouth. 01/27/21   [provider]  ibuprofen (ADVIL) 200 MG tablet Take 400 mg by mouth every 6 (six) hours as needed.    [provider]  metoprolol tartrate (LOPRESSOR) 25 MG tablet Take 12.5 mg by mouth 2 (two) times daily. 01/27/21   [provider]  midodrine (PROAMATINE) 2.5 MG tablet Take 2.5 mg by mouth See admin instructions. Take 1/2 tablet twice a day 01/27/21   [provider]  Multiple Vitamins-Minerals (HAIR SKIN & NAILS ADVANCED) TABS Take 1 tablet by mouth daily. Patient not taking: Reported on 05/10/2021    [provider]  naproxen sodium (ALEVE) 220 MG tablet Take 220 mg by mouth daily as needed  (for pain).    [provider]  pindolol (VISKEN) 5 MG tablet Take 0.5 tablets (2.5 mg total) by mouth 2 (two) times daily. Patient not taking: Reported on 05/10/2021 09/24/19   Leanor Kail, PA      Allergies    Shellfish allergy, Vancomycin, Isoflavones (soy), and Soy allergy    Review of Systems   Review of Systems  Cardiovascular:  Positive for chest pain.  All other systems reviewed and are negative.   Physical Exam Updated Vital Signs BP 109/65   Pulse 77   Temp 98.2 F (36.8 C)   Resp 17   Ht 5\' 1"  (1.549 m)   Wt 89.8 kg   SpO2 97%   BMI 37.41 kg/m  Physical Exam Vitals and nursing note reviewed.  Constitutional:      Appearance: She is well-developed.  HENT:     Head: Normocephalic.  Cardiovascular:     Rate and Rhythm: Normal rate and regular rhythm.     Heart sounds: Normal heart sounds.  Pulmonary:     Effort: Pulmonary effort is normal.     Breath sounds: Normal breath sounds.  Abdominal:     General: There is no distension.     Palpations: Abdomen is soft.  Musculoskeletal:        General: Normal range of motion.     Cervical back: Normal range of motion.  Skin:    General: Skin is warm.  Neurological:  General: No focal deficit present.     Mental Status: She is alert and oriented to person, place, and time.     ED Results / Procedures / Treatments   Labs (all labs ordered are listed, but only abnormal results are displayed) Labs Reviewed  BASIC METABOLIC PANEL - Abnormal; Notable for the following components:      Result Value   Glucose, Bld 123 (*)    All other components within normal limits  CBC - Abnormal; Notable for the following components:   Hemoglobin 11.2 (*)    HCT 34.6 (*)    All other components within normal limits  TROPONIN I (HIGH SENSITIVITY)    EKG EKG Interpretation  Date/Time:  Wednesday November 22 2021 14:31:32 EDT Ventricular Rate:  85 PR Interval:  128 QRS Duration: 88 QT Interval:  358 QTC  Calculation: 426 R Axis:   28 Text Interpretation: Normal sinus rhythm When compared with ECG of 10-May-2021 19:31, No significant change was found Confirmed by Octaviano Glow [phone]) on 11/22/2021 6:14:00 PM  Radiology DG Chest 2 View  Result Date: 11/22/2021 CLINICAL DATA:  Midsternal pain. Chest pain off and on for a few weeks. EXAM: CHEST - 2 VIEW COMPARISON:  Chest two views 05/10/2021 FINDINGS: Cardiac silhouette and mediastinal contours are within normal limits. The lungs are clear. No pleural effusion or pneumothorax. No acute skeletal abnormality. IMPRESSION: No active cardiopulmonary disease. Electronically Signed   By: Yvonne Kendall M.D.   On: 11/22/2021 15:25    Procedures Procedures    Medications Ordered in ED Medications - No data to display  ED Course/ Medical Decision Making/ A&P                           Medical Decision Making Patient reports a history of POTS she reports she recently has been having some dizziness and has experienced her heart racing and chest discomfort  Amount and/or Complexity of Data Reviewed External Data Reviewed: notes.    Details: Primary care and cardiology notes reviewed Labs: ordered. Decision-making details documented in ED Course.    Details: Labs ordered reviewed and interpreted patient's glucose is 123 hemoglobin is slightly low at 11.2 Radiology: ordered and independent interpretation performed. Decision-making details documented in ED Course.    Details: Chest x-ray is ordered and reviewed chest x-ray is normal ECG/medicine tests: ordered and independent interpretation performed. Decision-making details documented in ED Course.    Details: EKG is reviewed and interpreted as no acute abnormality  Risk Risk Details: Patient is currently asymptomatic,  discussed with her symptomatic treatment including increasing fluids.  Patient is advised to schedule to see her cardiologist for evaluation           Final Clinical  Impression(s) / ED Diagnoses Final diagnoses:  Palpitations  POTS (postural orthostatic tachycardia syndrome)    Rx / DC Orders ED Discharge Orders     None      An After Visit Summary was printed and given to the patient.    Fransico Meadow, Hershal Coria 11/22/21 2144    Wyvonnia Dusky, MD 11/22/21 [phone]

## 2021-11-22 NOTE — ED Triage Notes (Signed)
Onset couple of weeks  Chest pain off and on. Mid sternal pain.  No pain  at present.  Has headache  States has SVT and being seen by cardiology.  States has have blurry vision; headache and feeling dizzy  Nausea no vomiting.

## 2021-11-22 NOTE — Discharge Instructions (Signed)
Return if any problems.

## 2021-11-22 NOTE — ED Notes (Signed)
Pts belongings left in the room. They were moved to the Charge office. Pt called for her to retrieve belongs. Black wallet.

## 2021-12-04 ENCOUNTER — Other Ambulatory Visit (HOSPITAL_COMMUNITY)
Admission: RE | Admit: 2021-12-04 | Discharge: 2021-12-04 | Disposition: A | Discharge status: Home / Self Care | Payer: BC Managed Care – PPO | Source: Ambulatory Visit | Attending: Adult Health | Admitting: Adult Health

## 2021-12-04 ENCOUNTER — Other Ambulatory Visit (HOSPITAL_COMMUNITY)
Admission: RE | Admit: 2021-12-04 | Discharge: 2021-12-04 | Disposition: A | Discharge status: Home / Self Care | Payer: BC Managed Care – PPO | Source: Ambulatory Visit

## 2021-12-04 ENCOUNTER — Encounter: Payer: Self-pay | Admitting: Adult Health

## 2021-12-04 ENCOUNTER — Ambulatory Visit (INDEPENDENT_AMBULATORY_CARE_PROVIDER_SITE_OTHER): Payer: BC Managed Care – PPO | Admitting: Adult Health

## 2021-12-04 VITALS — BP 124/82 | HR 84 | Ht 61.0 in | Wt 197.0 lb

## 2021-12-04 DIAGNOSIS — R5383 Other fatigue: Secondary | ICD-10-CM

## 2021-12-04 DIAGNOSIS — Z124 Encounter for screening for malignant neoplasm of cervix: Secondary | ICD-10-CM | POA: Insufficient documentation

## 2021-12-04 DIAGNOSIS — N6452 Nipple discharge: Secondary | ICD-10-CM | POA: Insufficient documentation

## 2021-12-04 DIAGNOSIS — N920 Excessive and frequent menstruation with regular cycle: Secondary | ICD-10-CM | POA: Diagnosis not present

## 2021-12-04 DIAGNOSIS — N921 Excessive and frequent menstruation with irregular cycle: Secondary | ICD-10-CM

## 2021-12-04 DIAGNOSIS — N946 Dysmenorrhea, unspecified: Secondary | ICD-10-CM

## 2021-12-04 DIAGNOSIS — Z3202 Encounter for pregnancy test, result negative: Secondary | ICD-10-CM | POA: Diagnosis not present

## 2021-12-04 LAB — POCT URINE PREGNANCY: Preg Test, Ur: NEGATIVE

## 2021-12-05 LAB — CBC
Hematocrit: 35.8 % (ref 34.0–46.6)
Hemoglobin: 11.7 g/dL (ref 11.1–15.9)
MCH: 28.5 pg (ref 26.6–33.0)
MCHC: 32.7 g/dL (ref 31.5–35.7)
MCV: 87 fL (ref 79–97)
Platelets: 415 10*3/uL (ref 150–450)
RBC: 4.11 x10E6/uL (ref 3.77–5.28)
RDW: 14.4 % (ref 11.7–15.4)
WBC: 8.2 10*3/uL (ref 3.4–10.8)

## 2021-12-05 LAB — TSH: TSH: 1.8 u[IU]/mL (ref 0.450–4.500)

## 2021-12-05 NOTE — Progress Notes (Signed)
Cardiology Office Note   Date:  12/07/2021   ID:  [name] [name], DOB 23-Mar-1986, MRN [id number]  PCP:  Vivien Presto, MD  Cardiologist:   Dietrich Pates, MD   Patient returns for follow up dizziness   History of Present Illness: [name] [name] is a 36 y.o. female with a history of autonomic dysfunction,  and SVT (s/p ablation 2004)  She had a treadmill test in 2018  Walked on 2.4 min   Peak HR 184 bpm   Normal Echo   Added pindolol but patient did not tolerate due to dizziness    Tried midodrine   Did not tolerate due to SE     I last saw the pt as a video visit in may 2021  The pt was seen in the ER on 11/22/21   She says she is very busy with work and family.   Works at [employer]   On feet all day   On day of visit she developed chest pain   / pressure, felt heart racing   Dizzy    Felt like going to fall out.   Decided o go to ED.    By time she got there it had subsided. The patient says these spells are happeing a lot    She is tired, fatigued   In May she had 3 days of extremely high BP readings   (checked her cuff on other people to confirm accurate)  BP readings 200s/110s-149  Not on meds at time time     Has not had since    Usually low    Notes no change in food intake or use of other meds at that time    Current Meds  Medication Sig   diphenhydrAMINE (BENADRYL) 25 mg capsule Take 25 mg by mouth every 6 (six) hours as needed for itching.   ibuprofen (ADVIL) 200 MG tablet Take 400 mg by mouth every 6 (six) hours as needed.   Multiple Vitamins-Minerals (HAIR SKIN & NAILS ADVANCED) TABS Take 1 tablet by mouth daily.   naproxen sodium (ALEVE) 220 MG tablet Take 220 mg by mouth daily as needed (for pain).   propranolol (INDERAL) 10 MG tablet Take 1 tablet (10 mg total) by mouth 2 (two) times daily.   [DISCONTINUED] metoprolol tartrate (LOPRESSOR) 25 MG tablet Take 12.5 mg by mouth 2 (two) times daily.     Allergies:   Shellfish allergy, Vancomycin, Isoflavones  (soy), and Soy allergy   Past Medical History:  Diagnosis Date   Anxiety    Depression    DUB (dysfunctional uterine bleeding)    Dysautonomia (HCC)    History of echocardiogram    Echo 2/18: EF 60-65, no RWMA   History of exercise stress test    a. ETT 1/18: Ex 5', no ischemic changes, no arrhythmias.   Palpitations    Event Monitor 12/17: NSR, no dysrhythmias   Panic attacks    POTS (postural orthostatic tachycardia syndrome)    Schizoaffective disorder, bipolar type (HCC)    With PTSD   SVT (supraventricular tachycardia) (HCC) 2004   S/P slow path modification    Past Surgical History:  Procedure Laterality Date   CESAREAN SECTION     TUBAL LIGATION     VENTRICULAR ABLATION SURGERY     For supraventricular tachycardia     Social History:  The patient  reports that she has never smoked. She has never used smokeless tobacco. She reports that she does not  drink alcohol and does not use drugs.   Family History:  The patient's family history includes Depression in an other family member; Diabetes in her mother and another family member; Heart disease in her mother; Heart failure in her father and mother; Hypertension in her mother and another family member.    ROS:  Please see the history of present illness. All other systems are reviewed and  Negative to the above problem except as noted.    PHYSICAL EXAM: VS:  BP 116/80   Pulse 71   Ht 5\' 1"  (1.549 m)   Wt 196 lb (88.9 kg)   SpO2 98%   BMI 37.03 kg/m   BP laying 140/60  P 73   Sitting   138/90 P 70  Standing 127/80  P 80  Standing 4 min 126/78  P 82   GEN: Morbidly obese 36 yo in no acute distress  HEENT: normal  Neck: no JVD, carotid bruits Cardiac: RRR; no murmurs, rubs  No LE edema  Respiratory:  clear to auscultation bilaterally, normal work of breathing GI: soft, nontender, nondistended, + BS  No hepatomegaly  MS: no deformity Moving all extremities   Skin: warm and dry, no rash Neuro:  Strength and  sensation are intact Psych: euthymic mood, full affect   EKG:  EKG shows SR 71 bpm    CARDIAC TESTS  Echo 06/2016   - Left ventricle: The cavity size was normal. Wall thickness was   normal. Systolic function was normal. The estimated ejection   fraction was in the range of 60% to 65%. Wall motion was normal;   there were no regional wall motion abnormalities. Left   ventricular diastolic function parameters were normal.   Impressions:   - Normal study.   ETT 07/05/16 Blood pressure demonstrated a normal response to exercise. There was no ST segment deviation noted during stress.   Normal exercise treadmill stress test. No ischemia. Poor exercise tolerance. Normal BP response to stress.    Monitor 05/2016 The heart monitor shows normal heart rhythm. No significant dysrhythmias were identified. Continue current treatment plan. Please fax a copy of this study result to her PCP:  PROVIDER NOT IN SYSTEM  Thanks! Lipid Panel    Component Value Date/Time   CHOL 151 08/08/2012 0934   TRIG 101.0 08/08/2012 0934   HDL 45.70 08/08/2012 0934   CHOLHDL 3 08/08/2012 0934   VLDL 20.2 08/08/2012 0934   LDLCALC 85 08/08/2012 0934      Wt Readings from Last 3 Encounters:  12/06/21 196 lb (88.9 kg)  12/04/21 197 lb (89.4 kg)  11/22/21 198 lb (89.8 kg)      ASSESSMENT AND PLAN:  1   Chest pressure / palpitations / dizziness    May all be lrelated to autonomic dysfunction    Though not orthostatic today, I wonder if she is at times    Job does not help, on feet all day     I encouraged continue fluid intake I would try low dose propranolol   10 mg bid and follow response  2  HTN   Pt with 3 days of very high BP   Very strange   If recurs will look for secondary causes   (check renal arteries, check endocrine)    3  Cardiac risk assessment  Hx cardiac problems on maternal side of family   Will get lipomed, Lpa and apoB  4  Obesity   Discussed diet   Limit carbs  will check  Hgb A1C   This may help symptoms as well   Favor mediterranean diet, low sugar   Follow up in 3 months, sooner for problems    Current medicines are reviewed at length with the patient today.  The patient does not have concerns regarding medicines.  Signed, Dietrich Pates, MD  12/07/2021 7:46 AM    [facility] [address] [place], [place], [place]  [address] Phone: [phone]; Fax: [phone]

## 2021-12-06 ENCOUNTER — Encounter: Payer: Self-pay | Admitting: Internal Medicine

## 2021-12-06 ENCOUNTER — Ambulatory Visit (INDEPENDENT_AMBULATORY_CARE_PROVIDER_SITE_OTHER): Payer: BC Managed Care – PPO | Admitting: Internal Medicine

## 2021-12-06 ENCOUNTER — Ambulatory Visit (HOSPITAL_COMMUNITY)
Admission: RE | Admit: 2021-12-06 | Discharge: 2021-12-06 | Disposition: A | Discharge status: Home / Self Care | Payer: BC Managed Care – PPO | Source: Ambulatory Visit | Attending: Adult Health | Admitting: Adult Health

## 2021-12-06 VITALS — BP 116/80 | HR 71 | Ht 61.0 in | Wt 196.0 lb

## 2021-12-06 DIAGNOSIS — N946 Dysmenorrhea, unspecified: Secondary | ICD-10-CM | POA: Insufficient documentation

## 2021-12-06 DIAGNOSIS — N921 Excessive and frequent menstruation with irregular cycle: Secondary | ICD-10-CM | POA: Diagnosis present

## 2021-12-06 DIAGNOSIS — R0600 Dyspnea, unspecified: Secondary | ICD-10-CM | POA: Diagnosis not present

## 2021-12-06 DIAGNOSIS — R5383 Other fatigue: Secondary | ICD-10-CM | POA: Diagnosis not present

## 2021-12-06 DIAGNOSIS — R002 Palpitations: Secondary | ICD-10-CM

## 2021-12-06 DIAGNOSIS — G901 Familial dysautonomia [Riley-Day]: Secondary | ICD-10-CM

## 2021-12-06 LAB — CYTOLOGY - NON PAP

## 2021-12-06 MED ORDER — PROPRANOLOL HCL 10 MG PO TABS: 10.0000 mg | ORAL_TABLET | Freq: Two times a day (BID) | ORAL | 3 refills | Status: AC

## 2021-12-06 NOTE — Patient Instructions (Addendum)
Medication Instructions:  Propranolol 10 mg twice a a day Stop Metoprolol   *If you need a refill on your cardiac medications before your next appointment, please call your pharmacy*   Lab Work: NMR apo b, lipo a, cortisol, hgba1c. Uric acid  If you have labs (blood work) drawn today and your tests are completely normal, you will receive your results only by: MyChart Message (if you have MyChart) OR A paper copy in the mail If you have any lab test that is abnormal or we need to change your treatment, we will call you to review the results.   Testing/Procedures:    Follow-Up: At [facility], you and your health needs are our priority.  As part of our continuing mission to provide you with exceptional heart care, we have created designated Provider Care Teams.  These Care Teams include your primary Cardiologist (physician) and Advanced Practice Providers (APPs -  Physician Assistants and Nurse Practitioners) who all work together to provide you with the care you need, when you need it.  We recommend signing up for the patient portal called "MyChart".  Sign up information is provided on this After Visit Summary.  MyChart is used to connect with patients for Virtual Visits (Telemedicine).  Patients are able to view lab/test results, encounter notes, upcoming appointments, etc.  Non-urgent messages can be sent to your provider as well.   To learn more about what you can do with MyChart, go to [email or web address].     Your next appointment:   3 month(s)  The format for your next appointment:   In Person  Provider:   Dr Dietrich Pates   If primary card or EP is not listed click here to update    :1}    Other Instructions   Important Information About Sugar

## 2021-12-07 LAB — CYTOLOGY - PAP
Chlamydia: NEGATIVE
Comment: NEGATIVE
Comment: NEGATIVE
Comment: NORMAL
Diagnosis: NEGATIVE
High risk HPV: NEGATIVE
Neisseria Gonorrhea: NEGATIVE

## 2021-12-07 LAB — NMR, LIPOPROFILE
Cholesterol, Total: 218 mg/dL — ABNORMAL HIGH (ref 100–199)
HDL Particle Number: 35.9 umol/L (ref >=30.5)
HDL-C: 56 mg/dL (ref >39)
LDL Particle Number: 1374 nmol/L — ABNORMAL HIGH (ref <1000)
LDL Size: 21.8 nm (ref >20.5)
LDL-C (NIH Calc): 130 mg/dL — ABNORMAL HIGH (ref 0–99)
LP-IR Score: 37 (ref <=45)
Small LDL Particle Number: 425 nmol/L (ref <=527)
Triglycerides: 179 mg/dL — ABNORMAL HIGH (ref 0–149)

## 2021-12-07 LAB — LIPOPROTEIN A (LPA): Lipoprotein (a): 167 nmol/L — ABNORMAL HIGH (ref <75.0)

## 2021-12-07 LAB — APOLIPOPROTEIN B: Apolipoprotein B: 106 mg/dL — ABNORMAL HIGH (ref <90)

## 2021-12-07 LAB — URIC ACID: Uric Acid: 3.2 mg/dL (ref 2.6–6.2)

## 2021-12-07 LAB — HEMOGLOBIN A1C
Est. average glucose Bld gHb Est-mCnc: 103 mg/dL
Hgb A1c MFr Bld: 5.2 % (ref 4.8–5.6)

## 2021-12-07 LAB — CORTISOL: Cortisol: 9.5 ug/dL (ref 6.2–19.4)

## 2021-12-21 ENCOUNTER — Encounter (HOSPITAL_COMMUNITY): Payer: Self-pay

## 2021-12-21 ENCOUNTER — Ambulatory Visit (HOSPITAL_COMMUNITY)
Admission: RE | Admit: 2021-12-21 | Discharge: 2021-12-21 | Disposition: A | Discharge status: Home / Self Care | Payer: BC Managed Care – PPO | Source: Ambulatory Visit | Attending: Adult Health | Admitting: Adult Health

## 2021-12-21 DIAGNOSIS — N6452 Nipple discharge: Secondary | ICD-10-CM

## 2021-12-25 NOTE — Progress Notes (Signed)
Pt was having menorrhagia and last pap was 2019. So on 12/04/21 she had pap with HPV genotyping and also GC/Chlamydia which I do when having bleeding issues.

## 2022-02-27 NOTE — Progress Notes (Deleted)
Cardiology Office Note   Date:  02/27/2022   ID:  [name], DOB 1985-07-26, MRN [id number]  PCP:  Curly Rim, MD  Cardiologist:   Dorris Carnes, MD   Patient returns for follow up dizziness   History of Present Illness: [name] [name] is a 36 y.o. female with a history of autonomic dysfunction,  and SVT (s/p ablation 2004)  She had a treadmill test in 2018  Walked on 2.4 min   Peak HR 184 bpm   Normal Echo   Added pindolol but patient did not tolerate due to dizziness    Tried midodrine   Did not tolerate due to SE     I last saw the pt as a video visit in may 2021  The pt was seen in the ER on 11/22/21   She says she is very busy with work and family.   Works at a [employer] all day   On day of visit she developed chest pain   / pressure, felt heart racing   [place] like going to fall out.   Decided o go to ED.    By time she got there it had subsided. The patient says these spells are happeing a lot    She is tired, fatigued   In May she had 3 days of extremely high BP readings   (checked her cuff on other people to confirm accurate)  BP readings 200s/110s-149  Not on meds at time time     Has not had since    Usually low    Notes no change in food intake or use of other meds at that time    I saw the pt in June 2023   No outpatient medications have been marked as taking for the 02/28/22 encounter (Appointment) with Fay Records, MD.     Allergies:   Shellfish allergy, Vancomycin, Isoflavones (soy), and Soy allergy   Past Medical History:  Diagnosis Date   Anxiety    Depression    DUB (dysfunctional uterine bleeding)    Dysautonomia ([place])    History of echocardiogram    Echo 2/18: EF 60-65, no RWMA   History of exercise stress test    a. ETT 1/18: Ex 5', no ischemic changes, no arrhythmias.   Palpitations    Event Monitor 12/17: NSR, no dysrhythmias   Panic attacks    POTS (postural orthostatic tachycardia syndrome)    Schizoaffective  disorder, bipolar type ([place])    With PTSD   SVT (supraventricular tachycardia) ([place]) 2004   S/P slow path modification    Past Surgical History:  Procedure Laterality Date   CESAREAN SECTION     TUBAL LIGATION     VENTRICULAR ABLATION SURGERY     For supraventricular tachycardia     Social History:  The patient  reports that she has never smoked. She has never used smokeless tobacco. She reports that she does not drink alcohol and does not use drugs.   Family History:  The patient's family history includes Depression in an other family member; Diabetes in her mother and another family member; Heart disease in her mother; Heart failure in her father and mother; Hypertension in her mother and another family member.    ROS:  Please see the history of present illness. All other systems are reviewed and  Negative to the above problem except as noted.    PHYSICAL EXAM: VS:  There were  no vitals taken for this visit.  BP laying 140/60  P 73   Sitting   138/90 P 70  Standing 127/80  P 80  Standing 4 min 126/78  P 82   GEN: Morbidly obese 36 yo in no acute distress  HEENT: normal  Neck: no JVD, carotid bruits Cardiac: RRR; no murmurs, rubs  No LE edema  Respiratory:  clear to auscultation bilaterally, normal work of breathing GI: soft, nontender, nondistended, + BS  No hepatomegaly  MS: no deformity Moving all extremities   Skin: warm and dry, no rash Neuro:  Strength and sensation are intact Psych: euthymic mood, full affect   EKG:  EKG shows SR 71 bpm    CARDIAC TESTS  Echo 06/2016   - Left ventricle: The cavity size was normal. Wall thickness was   normal. Systolic function was normal. The estimated ejection   fraction was in the range of 60% to 65%. Wall motion was normal;   there were no regional wall motion abnormalities. Left   ventricular diastolic function parameters were normal.   Impressions:   - Normal study.   ETT 07/05/16 Blood pressure demonstrated a normal  response to exercise. There was no ST segment deviation noted during stress.   Normal exercise treadmill stress test. No ischemia. Poor exercise tolerance. Normal BP response to stress.    Monitor 05/2016 The heart monitor shows normal heart rhythm. No significant dysrhythmias were identified. Continue current treatment plan. Please fax a copy of this study result to her PCP:  PROVIDER NOT IN SYSTEM  Thanks! Lipid Panel    Component Value Date/Time   CHOL 151 08/08/2012 0934   TRIG 101.0 08/08/2012 0934   HDL 45.70 08/08/2012 0934   CHOLHDL 3 08/08/2012 0934   VLDL 20.2 08/08/2012 0934   LDLCALC 85 08/08/2012 0934      Wt Readings from Last 3 Encounters:  12/06/21 196 lb (88.9 kg)  12/04/21 197 lb (89.4 kg)  11/22/21 198 lb (89.8 kg)      ASSESSMENT AND PLAN:  1   Chest pressure / palpitations / dizziness    May all be lrelated to autonomic dysfunction    Though not orthostatic today, I wonder if she is at times    Job does not help, on feet all day     I encouraged continue fluid intake I would try low dose propranolol   10 mg bid and follow response  2  HTN   Pt with 3 days of very high BP   Very strange   If recurs will look for secondary causes   (check renal arteries, check endocrine)    3  Cardiac risk assessment  Hx cardiac problems on maternal side of family   Will get lipomed, Lpa and apoB  4  Obesity   Discussed diet   Limit carbs    will check Hgb A1C   This may help symptoms as well   Favor mediterranean diet, low sugar   Follow up in 3 months, sooner for problems    Current medicines are reviewed at length with the patient today.  The patient does not have concerns regarding medicines.  Signed, Dietrich Pates, MD  02/27/2022 6:00 PM    [facility] [address] [place], [place], [place]  [address] Phone: [phone]; Fax: [phone]

## 2022-02-28 ENCOUNTER — Ambulatory Visit: Payer: BC Managed Care – PPO | Admitting: Internal Medicine

## 2023-04-08 NOTE — Progress Notes (Unsigned)
Cardiology Office Note:    Date:  04/09/2023   ID:  Zanyla Jorde, DOB 12-Nov-1985, MRN [id number]  PCP:  Vivien Presto, MD  Cardiologist:  Dietrich Pates, MD     Referring MD: Vivien Presto, MD   Chief Complaint: follow-up of autonomic dysfunction  History of Present Illness:    [name] [name] is a 37 y.o. female with a history of paroxysmal SVT s/p remote ablation in 2004, autonomic dysfunction with POTS, hyperlipidemia, dysfunction uterine bleeding, obesity,  anxiety/ depression, and schizoaffective disorder who is followed by Dr. Tenny Craw and presents today for routine follow-up.   Patient has a history of SVT with remote ablation in 2004 by Dr. Graciela Husbands as well as autonomic dysfunction with POTS. Monitor in 05/2016 showed sinus rhythm with no significant arrhythmias. Echo in 06/2016 showed LVEF of 60-65% with normal wall motion and diastolic parameters. ETT at that same time was negative for ischemia.   She was last seen by Dr. Tenny Craw in 11/2021 at which time she reported episodes of chest pain, heart racing, and dizziness and actually had recently been seen in the ED for one of these episodes. She also reported 3 days of extremely high BP readings with BP readings as high as the 200s/140s in 10/2021 but no elevated BP readings since. Her BP usually runs low. Orthostatics were negative in the office but symptoms were felt to possibly be due to her autonomic dysfunction. She was encouraged to stay well hydrated and was started on Propranolol. NMR lipoprofile, lipoprotein (a), and apolipoprotein B were checked given family history of cardiac disease. NMR showed Total Cholesterol 218, Triglycerides 179, HDL 35.9, LDL 130 with an elevated LDL particle number of 1,374. Lipoprotein (a) was elevated at 167 and Apolipoprotein B was elevated at 106. Statin was recommended but patient wanted to think about this more.   Patient presents today for follow-up. Patient continue to have intermittent episodes  of palpitations, which she describes as heart racing, with associated dizziness and chest pressure. No syncope.  These feel like the episodes that she has had for years. Chest pressures seems to be correlated with episodes of heart racing. They come and go. Sometimes she will go weeks or months without having any issues and then the symptoms will return. She has not been able to correlate this with hydration or sleep patterns Symptoms have been were over the last 3 weeks; however, suspect this is related to recent personal tragedy - her 31 year old son died earlier this month. He was in a severe car crash and was in the ICU for several days but ultimately did not make it. She states she has been trying to stay well hydrated but it does not sound like she has been eating much since then. I saw her after 3pm and she had not eaten any yet. She also describes a sensation of her heart pauses followed by a hard thump which sounds like possible premature beats as well as infrequent episodes of very fast heart rates with her heart rates getting as high as the 200s briefly. She has a history of SVT so this concerns here. However, she states this happens maybe once every couple of months. She also reports some dyspnea on exertion that she state as been worse then usual over the last few weeks. She states sometimes she will even get short of breath walking from room to room in her house. She states she googled "broken heart syndrome" and is concerned she may  have this. She denies any orthopnea, PND, or significant edema. She also describes fatigue and weakness but this is not new.   Her BP is borderline elevated in the office today at 128/90. Orthostatics were negative. However, she states she woke up yesterday in the morning and did not feel great and BP was 88/55. She reports occasional episodes of vision changes (which she reports looks like static) and tingling on the side of her face. These symptoms seem to occur with  her heart racing and dizziness but not always. She has not checked her BP during one of these episodes but suspect this may occur with significant drops or spikes in her BP. She states last year when her BP was markedly elevated for those 3 days, she had some similar symptoms. I recommended patient check her BP if this happens again. She states she is only able to take her Propranolol as needed because her BP drops to low if she takes it every day. She does not improvement with it though.   She is not sure what to make of her symptoms or when she should be worried about this or not. We discussed this. She remains concerned given her family history of cardiovascular disease. He mother had a significant history of CAD and was initially diagnosed around 24-56 years old.   We spent the majority of the visit talking about her son, [name] [name]. I listened as she shared what happened and she shared pictures with me. I expressed my deepest condolences. She is understandably grieving and I tried to reassure her that there is not a timeline on grief for something like this. She states "I don't know if I am going to be able to make it." She has 2 other children (a 49 year old and 108 year old); however, she states she does not have a great support system. I asked her if she would like me to refer her to a Psychiatrist/ counselor but I do not think she is ready for this quite yet. She denies any thoughts of hurting herself.   EKGs/Labs/Other Studies Reviewed:    The following studies were reviewed:  Monitor 05/2016: Sinus rhythm   No arrhythmias detected  _______________  Echocardiogram 07/05/2016: Study Conclusions: - Left ventricle: The cavity size was normal. Wall thickness was    normal. Systolic function was normal. The estimated ejection    fraction was in the range of 60% to 65%. Wall motion was normal;    there were no regional wall motion abnormalities. Left    ventricular diastolic function parameters  were normal.  _______________  Exercise Tolerance Test 07/05/2016: Blood pressure demonstrated a normal response to exercise. There was no ST segment deviation noted during stress.   Normal exercise treadmill stress test. No ischemia. Poor exercise tolerance. Normal BP response to stress.   EKG:  EKG ordered today.   EKG Interpretation Date/Time:  Tuesday April 09 2023 15:04:16 EDT Ventricular Rate:  70 PR Interval:  122 QRS Duration:  92 QT Interval:  374 QTC Calculation: 403 R Axis:   16  Text Interpretation: Normal sinus rhythm When compared with ECG of 22-Nov-2021 14:31, No significant change was found Confirmed by Marjie Skiff [phone]) on 04/09/2023 3:20:28 PM    Recent Labs: No results found for requested labs within last 365 days.  Recent Lipid Panel    Component Value Date/Time   CHOL 151 08/08/2012 0934   TRIG 101.0 08/08/2012 0934   HDL 45.70 08/08/2012 0934   CHOLHDL  3 08/08/2012 0934   VLDL 20.2 08/08/2012 0934   LDLCALC 85 08/08/2012 0934    Physical Exam:    Vital Signs: BP (!) 128/90 (BP Location: Right Arm, Patient Position: Sitting, Cuff Size: Normal)   Pulse 70   Ht 5\' 1"  (1.549 m)   Wt 186 lb 9.6 oz (84.6 kg)   SpO2 98%   BMI 35.26 kg/m     Wt Readings from Last 3 Encounters:  04/09/23 186 lb 9.6 oz (84.6 kg)  12/06/21 196 lb (88.9 kg)  12/04/21 197 lb (89.4 kg)     General: 37 y.o. obese Caucasian female in no acute distress. HEENT: Normocephalic and atraumatic.  Neck: Supple. No carotid bruits. No JVD. Heart: RRR. Distinct S1 and S2. No murmurs, gallops, or rubs.  Lungs: No increased work of breathing. Clear to ausculation bilaterally. No wheezes, rhonchi, or rales.  Extremities: No lower extremity edema.   Skin: Warm and dry. Neuro: No focal deficits. Psych: Normal affect. Responds appropriately.   Assessment:    1. Autonomic dysfunction   2. Paroxysmal SVT (supraventricular tachycardia) (HCC)   3. Dyspnea on exertion    4. Hyperlipidemia, unspecified hyperlipidemia type   5. Family history of early CAD     Plan:    Autonomic Dysfunction Patient has a history of autonomic dysfunction and POTS with intermittent episodes of palpitations (heart racing) with associated dizziness and chest pressure. These episodes have been worse over the last couple of weeks which I suspect is related to recent person tragedy (death of her 54 year old son after a car accident) - she understandably has not been sleeping or eating well since.  - EKG today shows normal sinus rhythm with no acute ischemic changes.  - Orthostatics negative in the office today.  - She is prescribed Propranolol 10mg  twice daily but states she can only take it PRN because her BP drops to low if she takes it twice daily. OK to continue to take as needed given she does have improvement in symptoms with this.  - Discussed importance of staying well hydrated. Advised her to make sure she is eating throughout the day and encouraged her to eat a salty snake if her BP is low and she is symptomatic. Also recommended compression stockings during the day since she is up on her feet a lot.  Paroxysmal SVT  S/p remote ablation in 2004.  - She does note infrequent episode of heart racing where her HR is as high as the 200s. However, occurs may once every couple of months.  - Continue PRN Propranolol as needed for autonomic dysfunction as above.  - Given these episodes occur so rarely, I think it would be hard to catch this on a monitor. Recommended the [facility] device.   Dyspnea on Exertion Patient reports recent dyspnea on exertion that is worse than usual. She is wondering whether she could have "broken heart syndrome" after the death of her son.  - Euvolemic on exam. No other signs or symptoms of CHF.  - No real chest pain outside of episodes of heart racing/ dizziness which I suspect is due to her autonomic dysfunction.  - Will check a BNP and BMET.  -  Will check Echo.  Elevated BP without Formal Diagnosis of Hypertension BP borderline elevated today at 128/90. However, she is still having a lot of low BP readings and is symptomatic with these. Yesterday morning, her systolic BP in the high 80s.  - Continue to monitor for  now. It may be hard for [place] to treat any hypertension in the future given her autonomic dysfunction.   Hyperlipidemia NMR lipoprotein in 11/2021: Total Cholesterol 218, Triglycerides 179, HDL 35.9, LDL 130 with an elevated LDL particle number of 1,374. Lipoprotein (a) was elevated at 167 and Apolipoprotein B was elevated at 106. Statin was recommended at that time but patient wanted to think about it.  - Will repeat lipid panel today but will likely recommend starting a statin.  Cardiac Risk Assessment Family History of Early CAD Patient has a family history of CAD with her mothering being diagnosed with these around 36-62 years old.  - I do not think her current chest pain is angina. Suspect this is more related to her autonomic dysfunction. However, will check a coronary calcium score to assist with risk stratification. This will also help guide statin therapy.   Disposition: Follow up in 2-3 months after above studies.    Signed, Corrin Parker, PA-C  04/10/2023 6:00 AM    [facility]

## 2023-04-09 ENCOUNTER — Encounter: Payer: Self-pay | Admitting: Student

## 2023-04-09 ENCOUNTER — Ambulatory Visit: Payer: BC Managed Care – PPO | Attending: Student | Admitting: Student

## 2023-04-09 VITALS — BP 128/90 | HR 70 | Ht 61.0 in | Wt 186.6 lb

## 2023-04-09 DIAGNOSIS — Z8249 Family history of ischemic heart disease and other diseases of the circulatory system: Secondary | ICD-10-CM

## 2023-04-09 DIAGNOSIS — I471 Supraventricular tachycardia, unspecified: Secondary | ICD-10-CM

## 2023-04-09 DIAGNOSIS — G909 Disorder of the autonomic nervous system, unspecified: Secondary | ICD-10-CM

## 2023-04-09 DIAGNOSIS — R0609 Other forms of dyspnea: Secondary | ICD-10-CM | POA: Diagnosis not present

## 2023-04-09 DIAGNOSIS — E785 Hyperlipidemia, unspecified: Secondary | ICD-10-CM | POA: Diagnosis not present

## 2023-04-09 NOTE — Patient Instructions (Signed)
Medication Instructions:  No Changes *If you need a refill on your cardiac medications before your next appointment, please call your pharmacy*   Lab Work: BMET, BNP, Lipid Panel If you have labs (blood work) drawn today and your tests are completely normal, you will receive your results only by: MyChart Message (if you have MyChart) OR A paper copy in the mail If you have any lab test that is abnormal or we need to change your treatment, we will call you to review the results.   Testing/Procedures: Marjie Skiff PA-C has ordered a CT coronary calcium score.   Test locations:   [facility]  This is $99 out of pocket.   Coronary CalciumScan A coronary calcium scan is an imaging test used to look for deposits of calcium and other fatty materials (plaques) in the inner lining of the blood vessels of the heart (coronary arteries). These deposits of calcium and plaques can partly clog and narrow the coronary arteries without producing any symptoms or warning signs. This puts a person at risk for a heart attack. This test can detect these deposits before symptoms develop. Tell a health care provider about: Any allergies you have. All medicines you are taking, including vitamins, herbs, eye drops, creams, and over-the-counter medicines. Any problems you or family members have had with anesthetic medicines. Any blood disorders you have. Any surgeries you have had. Any medical conditions you have. Whether you are pregnant or may be pregnant. What are the risks? Generally, this is a safe procedure. However, problems may occur, including: Harm to a pregnant woman and her unborn baby. This test involves the use of radiation. Radiation exposure can be dangerous to a pregnant woman and her unborn baby. If you are pregnant, you generally should not have this procedure done. Slight increase in the risk of cancer. This is because of the radiation involved in the  test. What happens before the procedure? No preparation is needed for this procedure. What happens during the procedure? You will undress and remove any jewelry around your neck or chest. You will put on a hospital gown. Sticky electrodes will be placed on your chest. The electrodes will be connected to an electrocardiogram (ECG) machine to record a tracing of the electrical activity of your heart. A CT scanner will take pictures of your heart. During this time, you will be asked to lie still and hold your breath for 2-3 seconds while a picture of your heart is being taken. The procedure may vary among health care providers and hospitals. What happens after the procedure? You can get dressed. You can return to your normal activities. It is up to you to get the results of your test. Ask your health care provider, or the department that is doing the test, when your results will be ready. Summary A coronary calcium scan is an imaging test used to look for deposits of calcium and other fatty materials (plaques) in the inner lining of the blood vessels of the heart (coronary arteries). Generally, this is a safe procedure. Tell your health care provider if you are pregnant or may be pregnant. No preparation is needed for this procedure. A CT scanner will take pictures of your heart. You can return to your normal activities after the scan is done. This information is not intended to replace advice given to you by your health care provider. Make sure you discuss any questions you have with your health care provider. Document Released: 11/24/2007  Document Revised: 04/16/2016 Document Reviewed: 04/16/2016 Elsevier Interactive Patient Education  2017 Elsevier Inc.    [facility] Your physician has requested that you have an echocardiogram. Echocardiography is a painless test that uses sound waves to create images of your heart. It provides your doctor with information about the size and shape  of your heart and how well your heart's chambers and valves are working. This procedure takes approximately one hour. There are no restrictions for this procedure. Please do NOT wear cologne, perfume, aftershave, or lotions (deodorant is allowed). Please arrive 15 minutes prior to your appointment time.    Follow-Up: At [facility], you and your health needs are our priority.  As part of our continuing mission to provide you with exceptional heart care, we have created designated Provider Care Teams.  These Care Teams include your primary Cardiologist (physician) and Advanced Practice Providers (APPs -  Physician Assistants and Nurse Practitioners) who all work together to provide you with the care you need, when you need it.  We recommend signing up for the patient portal called "MyChart".  Sign up information is provided on this After Visit Summary.  MyChart is used to connect with patients for Virtual Visits (Telemedicine).  Patients are able to view lab/test results, encounter notes, upcoming appointments, etc.  Non-urgent messages can be sent to your provider as well.   To learn more about what you can do with MyChart, go to [email or web address].    Your next appointment:   2-3 month(s)  Provider:   Dietrich Pates, MD   Other Instructions Stay Hydrated, Recommend Wearing Compression Stockings.

## 2023-04-10 ENCOUNTER — Encounter: Payer: Self-pay | Admitting: Student

## 2023-04-10 LAB — BASIC METABOLIC PANEL
BUN/Creatinine Ratio: 18 (ref 9–23)
BUN: 13 mg/dL (ref 6–20)
CO2: 24 mmol/L (ref 20–29)
Calcium: 9.9 mg/dL (ref 8.7–10.2)
Chloride: 99 mmol/L (ref 96–106)
Creatinine, Ser: 0.72 mg/dL (ref 0.57–1.00)
Glucose: 79 mg/dL (ref 70–99)
Potassium: 4.7 mmol/L (ref 3.5–5.2)
Sodium: 141 mmol/L (ref 134–144)
eGFR: 110 mL/min/{1.73_m2} (ref >59)

## 2023-04-10 LAB — LIPID PANEL
Chol/HDL Ratio: 3.2 ratio (ref 0.0–4.4)
Cholesterol, Total: 220 mg/dL — ABNORMAL HIGH (ref 100–199)
HDL: 68 mg/dL (ref >39)
LDL Chol Calc (NIH): 134 mg/dL — ABNORMAL HIGH (ref 0–99)
Triglycerides: 105 mg/dL (ref 0–149)
VLDL Cholesterol Cal: 18 mg/dL (ref 5–40)

## 2023-04-10 LAB — BRAIN NATRIURETIC PEPTIDE: BNP: 19.4 pg/mL (ref 0.0–100.0)

## 2023-04-16 ENCOUNTER — Telehealth: Payer: Self-pay | Admitting: Internal Medicine

## 2023-04-16 NOTE — Telephone Encounter (Signed)
You will need to complete the required authorization form which allows [facility] to disclose protected health information You will need to complete the required [facility] patient forms billing sheet and submit a $29.00 form completion fee. Acceptable payment options are cash, check, or money order.    Once steps 1 and 2 are completed, we will begin completing your forms. Normal turnaround time, after we receive all required items, is 7-14 business days.  Patient notified of above information.  She will bring paperwork to office tomorrow

## 2023-04-16 NOTE — Telephone Encounter (Signed)
Patient is needing FMLA papers filled out by Dr. Tenny Craw. She can fax them to our office or bring them by. Please advise of best way to get this done ASAP.

## 2023-04-18 DIAGNOSIS — Z0279 Encounter for issue of other medical certificate: Secondary | ICD-10-CM

## 2023-04-19 NOTE — Telephone Encounter (Signed)
Received FMLA paperwork to go to her employer, [employer] Patient paid $29 forms fee. Paperwork in in Dr., Tenny Craw' box.

## 2023-05-07 ENCOUNTER — Ambulatory Visit (HOSPITAL_COMMUNITY): Payer: BC Managed Care – PPO | Attending: Student

## 2023-05-07 DIAGNOSIS — I471 Supraventricular tachycardia, unspecified: Secondary | ICD-10-CM | POA: Diagnosis not present

## 2023-05-07 DIAGNOSIS — G909 Disorder of the autonomic nervous system, unspecified: Secondary | ICD-10-CM | POA: Diagnosis not present

## 2023-05-07 LAB — ECHOCARDIOGRAM COMPLETE
Area-P 1/2: 4.33 cm2
S' Lateral: 2.8 cm

## 2023-05-15 ENCOUNTER — Ambulatory Visit: Payer: BC Managed Care – PPO | Admitting: Obstetrics & Gynecology

## 2023-05-16 ENCOUNTER — Ambulatory Visit: Payer: BC Managed Care – PPO | Admitting: Obstetrics & Gynecology

## 2023-05-17 ENCOUNTER — Ambulatory Visit (HOSPITAL_COMMUNITY)
Admission: RE | Admit: 2023-05-17 | Discharge: 2023-05-17 | Disposition: A | Discharge status: Home / Self Care | Payer: BC Managed Care – PPO | Source: Ambulatory Visit | Attending: Student | Admitting: Student

## 2023-05-17 DIAGNOSIS — G909 Disorder of the autonomic nervous system, unspecified: Secondary | ICD-10-CM

## 2023-05-17 DIAGNOSIS — I471 Supraventricular tachycardia, unspecified: Secondary | ICD-10-CM

## 2023-05-23 ENCOUNTER — Ambulatory Visit: Payer: BC Managed Care – PPO | Admitting: Obstetrics & Gynecology

## 2023-05-23 ENCOUNTER — Encounter: Payer: Self-pay | Admitting: Obstetrics & Gynecology

## 2023-05-23 VITALS — BP 123/87 | HR 83 | Ht 61.0 in | Wt 194.2 lb

## 2023-05-23 DIAGNOSIS — F4321 Adjustment disorder with depressed mood: Secondary | ICD-10-CM | POA: Diagnosis not present

## 2023-05-23 DIAGNOSIS — N921 Excessive and frequent menstruation with irregular cycle: Secondary | ICD-10-CM

## 2023-05-23 DIAGNOSIS — Z634 Disappearance and death of family member: Secondary | ICD-10-CM

## 2023-05-23 DIAGNOSIS — Z01411 Encounter for gynecological examination (general) (routine) with abnormal findings: Secondary | ICD-10-CM | POA: Diagnosis not present

## 2023-05-23 NOTE — Progress Notes (Signed)
WELL-WOMAN EXAMINATION Patient name: [name] [name] MRN [id number]  Date of birth: [birth year] Chief Complaint:   Gynecologic Exam  History of Present Illness:   [name] [name] is a 37 y.o. G16P3000 female being seen today for a routine well-woman exam and the following concerns:  Menses are irregular- no consistency.  Menses will last for 1-2 weeks with passage of clots and heavy bleeding.  May have to change a pad in under an hour.  Menses are not every month- typically every couple of months.    Records reviewed and has mentioned this issue to Roseanne Reno.  Work up including pelvic US was completed no acute abnormalities noted.  Prior US: 11/2021: 11.4 x 3.9 x 6.8 cm uterus.  159 cc  Of note patient has been struggling with grief as her son recently passed away from an MVA  No LMP recorded. (Menstrual status: Irregular Periods).  The current method of family planning is tubal ligation.    Last pap 11/2021.  Last mammogram: NA. Last colonoscopy: NA     05/23/2023    3:16 PM 04/23/2019   11:47 AM 11/21/2017    1:54 PM  Depression screen PHQ 2/9  Decreased Interest 3 0 2  Down, Depressed, Hopeless 3 0 2  PHQ - 2 Score 6 0 4  Altered sleeping 3  3  Tired, decreased energy 3  3  Change in appetite 3  3  Feeling bad or failure about yourself  1  3  Trouble concentrating 2  3  Moving slowly or fidgety/restless 2  2  Suicidal thoughts 0  1  PHQ-9 Score 20  22  Difficult doing work/chores   Extremely dIfficult      Review of Systems:   Pertinent items are noted in HPI Denies any headaches, blurred vision, fatigue, shortness of breath, chest pain, abdominal pain, bowel movements, urination, or intercourse unless otherwise stated above.  Pertinent History Reviewed:  Reviewed past medical,surgical, social and family history.  Reviewed problem list, medications and allergies. Physical Assessment:   Vitals:   05/23/23 1457  BP: 123/87  Pulse: 83  Weight: 194 lb 3.2 oz  (88.1 kg)  Height: 5\' 1"  (1.549 m)  Body mass index is 36.69 kg/m.        Physical Examination:   General appearance - well appearing, and in no distress  Mental status - alert, oriented to person, place, and time  Psych:  Flight of ideas, easily distracted  Skin - warm and dry, normal color, no suspicious lesions noted  Chest - effort normal, all lung fields clear to auscultation bilaterally  Heart - normal rate and regular rhythm  Neck:  midline trachea, no thyromegaly or nodules  Breasts - breasts appear normal, no suspicious masses, no skin or nipple changes or  axillary nodes  Abdomen - soft, nontender, nondistended, no masses or organomegaly  Pelvic - VULVA: normal appearing vulva with no masses, tenderness or lesions  VAGINA: normal appearing vagina with normal color and discharge, no lesions  CERVIX: normal appearing cervix without discharge or lesions, no CMT  UTERUS: uterus is felt to be normal size, shape, consistency and nontender   ADNEXA: No adnexal masses or tenderness noted.  Extremities:  No swelling or varicosities noted  Chaperone: Malachy Mood     Assessment & Plan:  1) Well-Woman Exam -pap uptodate reviewed screening guidelines  2) HMB -discussion with patient regarding all options -Due to questionable history of migraines with aura would recommend progesterone only options -Discussed  Pops, Depo, LARCs including IUD.  Reviewed TXA.  We then discussed surgical intervention such as endometrial ablation or hysterectomy -Patient thinking hysterectomy since she already had BTL -while her mood was appropriate, I was concerned about her decision- making regarding surgical intervention.  Strongly encouraged pt to consider medications as an initial step with f/u in 2-3 mos.  She was very apprehensive to medication -After much discussion, agreeable to lab work, review on her own and f/u in a few mos -was not interested in medical management at this time -Majority of  today's visit was spent in consultation regarding the above information  Orders Placed This Encounter  Procedures   CBC   Iron, TIBC and Ferritin Panel    Meds: No orders of the defined types were placed in this encounter.   Follow-up: Return in about 3 months (around 08/21/2023) for Medication follow up.   Myna Hidalgo, DO Attending Obstetrician & Gynecologist, [facility] for [employer], [facility]

## 2023-06-10 NOTE — Progress Notes (Deleted)
 Cardiology Office Note:    Date:  06/10/2023   ID:  [name] [name], DOB [birth year], MRN [id number]  PCP:  Bobbette Coye LABOR, MD  Cardiologist:  Vina Gull, MD { Click to update primary MD,subspecialty MD or APP then REFRESH:1}    Referring MD: Corrington, Kip A, MD   Chief Complaint: follow up of autonomic dysfunction   History of Present Illness:    [name] [name] is a 37 y.o. female with a history of paroxysmal SVT s/p remote ablation in 2004, autonomic dysfunction with POTS, hyperlipidemia, dysfunctional uterine bleeding, obesity,  anxiety/ depression, and schizoaffective disorder who is followed by Dr. Gull and presents today for follow-up of autonomic dysfunction   Patient has a history of SVT with remote ablation in 2004 by Dr. Fernande as well as autonomic dysfunction with POTS. Monitor in 05/2016 showed sinus rhythm with no significant arrhythmias. Echo in 06/2016 showed LVEF of 60-65% with normal wall motion and diastolic parameters. ETT at that same time was negative for ischemia.   Patient was last seen by me in 03/2023 at which time she continued to have intermittent palpitations which she described as heart racing with associated dizziness and chest pressure. Symptoms had been worse over the last 3 weeks but this correlated with a personal tragedy (her 52 year old son died around that time after a severe car crash). She also described some recent dyspnea on exertion and was concerned that she could have broken heart syndrome. BNP was normal and repeat Echo was ordered and showed LVEF of 55-60% with normal wall motion and diastolic parameters. A coronary calcium score was ordered for further risk stratification given family history of early CAD and was 0.   Patient presents today for follow-up. ***  Autonomic Dysfunction Patient has a history of autonomic dysfunction and POTS with intermittent episodes of palpitations (heart racing) with associated dizziness and chest pressure.   - ***  - She is prescribed Propranolol  10mg  twice daily but states she can only take it PRN because her BP drops to low if she takes it twice daily. OK to continue to take as needed given she does have improvement in symptoms with this.  - Discussed importance of staying well hydrated. Advised her to make sure she is eating throughout the day and encouraged her to eat a salty snake if her BP is low and she is symptomatic. Also recommended compression stockings during the day since she is up on her feet a lot. ***   Paroxysmal SVT  S/p remote ablation in 2004.  - She does note infrequent episode of heart racing where her HR is as high as the 200s. However, occurs may once every couple of months. *** - Continue PRN Propranolol  as needed for autonomic dysfunction as above.  - Given these episodes occur so rarely, I think it would be hard to catch this on a monitor. Recommended the [facility] device. ***   Hyperlipidemia NMR lipoprotein in 11/2021: Total Cholesterol 218, Triglycerides 179, HDL 35.9, LDL 130 with an elevated LDL particle number of 1,374. Lipoprotein (a) was elevated at 167 and Apolipoprotein B was elevated at 106. Statin was recommended at that time but patient wanted to think about it. Most recent LDL was 134 in 03/2023.  - Statin ***  Family History of Early CAD Patient has a family history of CAD with her mothering being diagnosed with these around 48-46 years old. However, coronary calcium score in 05/2023 was 0.    EKGs/Labs/Other Studies  Reviewed:    The following studies were reviewed:  Monitor 05/2016: Sinus rhythm   No arrhythmias detected  _______________   Echocardiogram 07/05/2016: Study Conclusions: - Left ventricle: The cavity size was normal. Wall thickness was    normal. Systolic function was normal. The estimated ejection    fraction was in the range of 60% to 65%. Wall motion was normal;    there were no regional wall motion abnormalities. Left     ventricular diastolic function parameters were normal.  _______________   Exercise Tolerance Test 07/05/2016: Blood pressure demonstrated a normal response to exercise. There was no ST segment deviation noted during stress.   Normal exercise treadmill stress test. No ischemia. Poor exercise tolerance. Normal BP response to stress.  _______________  Echocardiogram 05/07/2023: Impressions: 1. Left ventricular ejection fraction, by estimation, is 55 to 60%. Left  ventricular ejection fraction by 3D volume is 56 %. The left ventricle has  normal function. The left ventricle has no regional wall motion  abnormalities. Left ventricular diastolic   parameters were normal.   2. Right ventricular systolic function is normal. The right ventricular  size is normal. There is normal pulmonary artery systolic pressure. The  estimated right ventricular systolic pressure is 21.3 mmHg.   3. The mitral valve is grossly normal. Trivial mitral valve  regurgitation. No evidence of mitral stenosis.   4. The aortic valve is tricuspid. Aortic valve regurgitation is not  visualized. No aortic stenosis is present.   5. The inferior vena cava is normal in size with greater than 50%  respiratory variability, suggesting right atrial pressure of 3 mmHg.  _______________  CT Cardiac Scoring 05/17/2023: Impressions: 1. Coronary calcium score of 0. This was 1st percentile for age, gender, and race matched controls.   EKG:  EKG not ordered today.   Recent Labs: 04/09/2023: BNP 19.4; BUN 13; Creatinine, Ser 0.72; Potassium 4.7; Sodium 141  Recent Lipid Panel    Component Value Date/Time   CHOL 220 (H) 04/09/2023 1656   TRIG 105 04/09/2023 1656   HDL 68 04/09/2023 1656   CHOLHDL 3.2 04/09/2023 1656   CHOLHDL 3 08/08/2012 0934   VLDL 20.2 08/08/2012 0934   LDLCALC 134 (H) 04/09/2023 1656    Physical Exam:    Vital Signs: There were no vitals taken for this visit.    Wt Readings from Last 3  Encounters:  05/23/23 194 lb 3.2 oz (88.1 kg)  04/09/23 186 lb 9.6 oz (84.6 kg)  12/06/21 196 lb (88.9 kg)     General: 37 y.o. female in no acute distress. HEENT: Normocephalic and atraumatic. Sclera clear.  Neck: Supple. No carotid bruits. No JVD. Heart: *** RRR. Distinct S1 and S2. No murmurs, gallops, or rubs.  Lungs: No increased work of breathing. Clear to ausculation bilaterally. No wheezes, rhonchi, or rales.  Abdomen: Soft, non-distended, and non-tender to palpation.  Extremities: No lower extremity edema.  Radial and distal pedal pulses 2+ and equal bilaterally. Skin: Warm and dry. Neuro: No focal deficits. Psych: Normal affect. Responds appropriately.   Assessment:    No diagnosis found.  Plan:     Disposition: Follow up in ***   Signed, Aline FORBES Door, PA-C  06/10/2023 2:47 PM    [facility]

## 2023-06-18 ENCOUNTER — Ambulatory Visit: Payer: BC Managed Care – PPO | Attending: Student | Admitting: Student

## 2023-08-14 ENCOUNTER — Ambulatory Visit (HOSPITAL_COMMUNITY): Payer: BC Managed Care – PPO | Admitting: Clinical

## 2023-10-01 ENCOUNTER — Telehealth (HOSPITAL_COMMUNITY): Payer: Self-pay

## 2023-10-01 NOTE — Telephone Encounter (Signed)
 10/02/23 appt confirmed by pt

## 2023-10-02 ENCOUNTER — Ambulatory Visit (HOSPITAL_COMMUNITY): Admitting: Clinical

## 2023-10-14 NOTE — Progress Notes (Deleted)
 This visit was not completed and was rescheduled .   Please Delete

## 2024-01-30 ENCOUNTER — Ambulatory Visit (INDEPENDENT_AMBULATORY_CARE_PROVIDER_SITE_OTHER): Admitting: Clinical

## 2024-01-30 DIAGNOSIS — F419 Anxiety disorder, unspecified: Secondary | ICD-10-CM | POA: Diagnosis not present

## 2024-01-30 DIAGNOSIS — F4310 Post-traumatic stress disorder, unspecified: Secondary | ICD-10-CM | POA: Diagnosis not present

## 2024-01-30 DIAGNOSIS — F331 Major depressive disorder, recurrent, moderate: Secondary | ICD-10-CM
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
## 2024-03-02 ENCOUNTER — Other Ambulatory Visit: Payer: Self-pay | Admitting: Adult Health

## 2024-03-02 DIAGNOSIS — Z1231 Encounter for screening mammogram for malignant neoplasm of breast: Secondary | ICD-10-CM

## 2024-03-16 ENCOUNTER — Ambulatory Visit
Admission: RE | Admit: 2024-03-16 | Discharge: 2024-03-16 | Disposition: A | Discharge status: Home / Self Care | Source: Ambulatory Visit | Attending: Family Medicine | Admitting: Family Medicine

## 2024-03-16 DIAGNOSIS — Z1231 Encounter for screening mammogram for malignant neoplasm of breast: Secondary | ICD-10-CM

## 2024-03-19 ENCOUNTER — Ambulatory Visit: Payer: Self-pay | Admitting: Adult Health

## 2024-03-26 ENCOUNTER — Ambulatory Visit (HOSPITAL_COMMUNITY): Admitting: Clinical

## 2024-03-26 DIAGNOSIS — F419 Anxiety disorder, unspecified: Secondary | ICD-10-CM | POA: Diagnosis not present

## 2024-03-26 DIAGNOSIS — F4310 Post-traumatic stress disorder, unspecified: Secondary | ICD-10-CM

## 2024-03-26 DIAGNOSIS — F331 Major depressive disorder, recurrent, moderate: Secondary | ICD-10-CM

## 2024-03-26 NOTE — Progress Notes (Addendum)
 Virtual Visit via Telephone Note  I connected with Alan Malling on 03/26/24 at  4:00 PM EDT by telephone and verified that I am speaking with the correct person using two identifiers.  Location: Patient: home Provider: office   I discussed the limitations, risks, security and privacy concerns of performing an evaluation and management service by telephone and the availability of in person appointments. I also discussed with the patient that there may be a patient responsible charge related to this service. The patient expressed understanding and agreed to proceed.   THERAPIST PROGRESS NOTE   Session Time: 1:00 PM-1:30 PM   Participation Level: Active   Behavioral Response: CasualAlertDepressed   Type of Therapy: Individual Therapy   Treatment Goals addressed: Coping to assist with MH diagnosis  Interventions: CBT, DBT, Solution Focused, Strength-based and Supportive   Summary:  [name] [name] is a 38 y.o. female who presents with PTSD/ Recurrent Moderate MDD with Anxiety. The OPT therapist worked with the patient for her OPT treatment. The OPT therapist utilized Motivational Interviewing to assist in creating therapeutic repore. The patient in the session was engaged and work in collaboration with the OPT therapist overviewing the past few weeks highlighting her difficulty with being in a transitional moment in her life going through a separation with her husband that she verbalized she doesn't want, but indicated her husband is dedicated to going through with Separation and Divorce. The OPT therapist utilized Cognitive Behavioral Therapy through cognitive restructuring as well as worked with the patient on coping strategies to assist in management of mood.The patient spoke about her ongoing difficulty with automatic negative thoughts and worked in session with the OPT therapist on a exercise in challenging automatic negative thoughts. Additionally as a help tool the patient will continue  to utilize the in my control vs not in my control therapy tool . The patient will be working on meeting her basic needs areas with consistency., utilizing her support network, and utilizing meaningful distractions.   Suicidal/Homicidal: Nowithout intent/plan   Therapist Response: The OPT therapist worked with the patient for the patients scheduled session. The patient was engaged in her session and gave feedback in relation to triggers, symptoms, and behavior responses over the past few weeks. The OPT therapist worked with the patient on stressors of the patients primarily currently going through a separation from her husband. The patient spoke about her difficulty with mixed feelings and roller coaster emotions over the past few weeks being early in the process of being Separated from her husband. The OPT therapist worked with the patient utilizing an in session Cognitive Behavioral Therapy exercise. The patient was responsive in the session and verbalized,  I realized I am not at a place where I was wanted Divorce and I have tried to see if during separation we can still date and maybe work on fixing things, but he is adament that he is moving forward with separation and divorce and I am not wanted at his families for the holidays. The OPT therapist placed emphasis on the patient giving feedback to her med management prescriber in the upcoming follow up appointment for med therapy at the beginning of next week. The OPT therapist worked with the patient  on coping strategies, communication, and consistency with basic need areas.  The OPT therapist worked with the patient on the goal of ongoing consistency and awareness in management of her emotions and reactive behaviors. . The OPT therapist will continue treatment work with the patient in her next  scheduled session.      Plan: Return again in 2 weeks.   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   Patient/Guardian was advised Release of Information must be obtained prior to any record release in order to collaborate their care with an outside provider. Patient/Guardian was advised if they have not already done so to contact the registration department to sign all necessary forms in order for us  to release information regarding their care.    Consent: Patient/Guardian gives verbal consent for treatment and assignment of benefits for services provided during this visit. Patient/Guardian expressed understanding and agreed to proceed.      I discussed the assessment and treatment plan with the patient. The patient was provided an opportunity to ask questions and all were answered. The patient agreed with the plan and demonstrated an understanding of the instructions.   The patient was advised to call back or seek an in-person evaluation if the symptoms worsen or if the condition fails to improve as anticipated.   I provided 45 minutes of non-face-to-face time during this encounter.   Jerel ONEIDA Pepper, LCSW   03/26/2024

## 2024-04-16 ENCOUNTER — Ambulatory Visit (INDEPENDENT_AMBULATORY_CARE_PROVIDER_SITE_OTHER): Admitting: Clinical

## 2024-04-16 DIAGNOSIS — F4310 Post-traumatic stress disorder, unspecified: Secondary | ICD-10-CM

## 2024-04-16 DIAGNOSIS — F419 Anxiety disorder, unspecified: Secondary | ICD-10-CM

## 2024-04-16 DIAGNOSIS — F331 Major depressive disorder, recurrent, moderate: Secondary | ICD-10-CM | POA: Diagnosis not present
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
## 2024-06-03 ENCOUNTER — Ambulatory Visit: Attending: Cardiology | Admitting: Emergency Medicine

## 2024-06-03 NOTE — Progress Notes (Deleted)
" °  Cardiology Office Note:    Date:  06/03/2024  ID:  Bell Carbo, DOB 03-11-1986, MRN [id number] PCP: Bobbette Coye LABOR, MD  [facility] Providers Cardiologist:  Vina Gull, MD { Click to update primary MD,subspecialty MD or APP then REFRESH:1}    {Click to Open Review  :1}   Patient Profile:       Chief Complaint: *** History of Present Illness:  [name] [name] is a 38 y.o. female with visit-pertinent history of paroxysmal SVT s/p remote ablation in 2004, autonomic dysfunction with POTS, hyperlipidemia, dysfunctional uterine bleeding, obesity, anxiety/depression, and schizoaffective disorder who is followed by Dr. Gull  Patient with history of SVT with remote ablation in 2004 by Dr. Fernande as well as autonomic dysfunction with POTS.  Monitor in 2017 showed sinus rhythm with no significant arrhythmias.  Echocardiogram in 06/2016 showed LVEF 60 to 65% with normal wall motion and diastolic parameters.  She underwent ETT at that same time which was negative for ischemia.  Last seen by Dr. Gull in 11/2021 at which time she reported episodes of chest pain, heart racing, and dizziness.  She reported 3 days of extremely high blood pressure readings.  Orthostatics were negative in office.  Symptoms are felt possibly due to her autonomic dysfunction.  She was encouraged to stay well-hydrated as well as started on propranolol .  Patient was last seen in clinic on 04/09/2023 by Callie, NP.  Patient continues to have episodes of palpitations which she described as heart racing with associated dizziness which is felt like episodes that she has had for years.  No medication changes were made.  She underwent echocardiogram 05/07/2023 showed LVEF 55 to 60%, no RWMA, normal diastolic parameters, RV function size normal, normal PASP, trivial MR.  CT cardiac scoring 05/17/2023 showed a coronary calcium score of 0.  Discussed the use of AI scribe software for clinical note transcription with the patient,  who gave verbal consent to proceed.  History of Present Illness     Review of systems:  Please see the history of present illness. All other systems are reviewed and otherwise negative. ***      Studies Reviewed:        ***  Risk Assessment/Calculations:   {Does this patient have ATRIAL FIBRILLATION?:[phone]} No BP recorded.  {Refresh Note OR Click here to enter BP  :1}***        Physical Exam:   VS:  There were no vitals taken for this visit.   Wt Readings from Last 3 Encounters:  05/23/23 194 lb 3.2 oz (88.1 kg)  04/09/23 186 lb 9.6 oz (84.6 kg)  12/06/21 196 lb (88.9 kg)    GEN: Well nourished, well developed in no acute distress NECK: No JVD; No carotid bruits CARDIAC: ***RRR, no murmurs, rubs, gallops RESPIRATORY:  Clear to auscultation without rales, wheezing or rhonchi  ABDOMEN: Soft, non-tender, non-distended EXTREMITIES:  No edema; No acute deformity ***      Assessment and Plan:    Assessment and Plan Assessment & Plan      {Are you ordering a CV Procedure (e.g. stress test, cath, DCCV, TEE, etc)?   Press F2        :[id number]}  Dispo:  No follow-ups on file.  Signed, Lum LITTIE Louis, NP  "

## 2024-07-02 ENCOUNTER — Ambulatory Visit (INDEPENDENT_AMBULATORY_CARE_PROVIDER_SITE_OTHER): Admitting: Clinical

## 2024-07-02 DIAGNOSIS — F419 Anxiety disorder, unspecified: Secondary | ICD-10-CM

## 2024-07-02 DIAGNOSIS — F331 Major depressive disorder, recurrent, moderate: Secondary | ICD-10-CM

## 2024-07-02 DIAGNOSIS — F4310 Post-traumatic stress disorder, unspecified: Secondary | ICD-10-CM

## 2024-07-03 ENCOUNTER — Encounter (HOSPITAL_COMMUNITY): Payer: Self-pay
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
